# Patient Record
Sex: Male | Born: 1945 | Race: Black or African American | Hispanic: No | Marital: Single | Smoking: Former smoker
Health system: Southern US, Community
[De-identification: ages and names within clinical notes are randomized; demographics above are authoritative.]

## PROBLEM LIST (undated history)

## (undated) DIAGNOSIS — C801 Malignant (primary) neoplasm, unspecified: Secondary | ICD-10-CM

## (undated) DIAGNOSIS — N189 Chronic kidney disease, unspecified: Secondary | ICD-10-CM

## (undated) DIAGNOSIS — IMO0002 Reserved for concepts with insufficient information to code with codable children: Secondary | ICD-10-CM

## (undated) DIAGNOSIS — C9000 Multiple myeloma not having achieved remission: Secondary | ICD-10-CM

## (undated) DIAGNOSIS — Q600 Renal agenesis, unilateral: Secondary | ICD-10-CM

## (undated) DIAGNOSIS — I82409 Acute embolism and thrombosis of unspecified deep veins of unspecified lower extremity: Secondary | ICD-10-CM

## (undated) HISTORY — DX: Reserved for concepts with insufficient information to code with codable children: IMO0002

## (undated) HISTORY — DX: Renal agenesis, unilateral: Q60.0

## (undated) HISTORY — DX: Multiple myeloma not having achieved remission: C90.00

---

## 1999-07-09 ENCOUNTER — Ambulatory Visit (HOSPITAL_COMMUNITY): Admission: RE | Admit: 1999-07-09 | Discharge: 1999-07-09 | Payer: Self-pay | Admitting: General Surgery

## 2000-10-06 ENCOUNTER — Encounter: Payer: Self-pay | Admitting: Specialist

## 2000-10-09 ENCOUNTER — Observation Stay (HOSPITAL_COMMUNITY): Admission: RE | Admit: 2000-10-09 | Discharge: 2000-10-10 | Payer: Self-pay | Admitting: Specialist

## 2005-03-21 ENCOUNTER — Ambulatory Visit: Payer: Self-pay | Admitting: Oncology

## 2005-09-16 ENCOUNTER — Ambulatory Visit: Payer: Self-pay | Admitting: Oncology

## 2006-03-14 ENCOUNTER — Ambulatory Visit: Payer: Self-pay | Admitting: Oncology

## 2006-08-19 ENCOUNTER — Emergency Department (HOSPITAL_COMMUNITY): Admission: EM | Admit: 2006-08-19 | Discharge: 2006-08-19 | Payer: Self-pay | Admitting: Emergency Medicine

## 2006-11-16 ENCOUNTER — Ambulatory Visit: Payer: Self-pay | Admitting: Oncology

## 2009-12-20 ENCOUNTER — Emergency Department (HOSPITAL_COMMUNITY)
Admission: EM | Admit: 2009-12-20 | Discharge: 2009-12-20 | Payer: Self-pay | Source: Home / Self Care | Admitting: Emergency Medicine

## 2010-10-11 ENCOUNTER — Encounter (HOSPITAL_BASED_OUTPATIENT_CLINIC_OR_DEPARTMENT_OTHER): Payer: Self-pay | Admitting: Oncology

## 2010-10-11 ENCOUNTER — Other Ambulatory Visit: Payer: Self-pay | Admitting: Oncology

## 2010-10-11 DIAGNOSIS — C9000 Multiple myeloma not having achieved remission: Secondary | ICD-10-CM

## 2010-10-11 LAB — CBC WITH DIFFERENTIAL/PLATELET
BASO%: 0.2 % (ref 0.0–2.0)
Basophils Absolute: 0 10*3/uL (ref 0.0–0.1)
EOS%: 2 % (ref 0.0–7.0)
Eosinophils Absolute: 0.1 10*3/uL (ref 0.0–0.5)
HCT: 33.6 % — ABNORMAL LOW (ref 38.4–49.9)
HGB: 11.7 g/dL — ABNORMAL LOW (ref 13.0–17.1)
LYMPH%: 18.1 % (ref 14.0–49.0)
MCH: 31.8 pg (ref 27.2–33.4)
MCHC: 34.7 g/dL (ref 32.0–36.0)
MCV: 91.6 fL (ref 79.3–98.0)
MONO#: 0.3 10*3/uL (ref 0.1–0.9)
MONO%: 6.7 % (ref 0.0–14.0)
NEUT#: 2.9 10*3/uL (ref 1.5–6.5)
NEUT%: 73 % (ref 39.0–75.0)
Platelets: 144 10*3/uL (ref 140–400)
RBC: 3.67 10*6/uL — ABNORMAL LOW (ref 4.20–5.82)
RDW: 12.7 % (ref 11.0–14.6)
WBC: 3.9 10*3/uL — ABNORMAL LOW (ref 4.0–10.3)
lymph#: 0.7 10*3/uL — ABNORMAL LOW (ref 0.9–3.3)

## 2010-10-13 LAB — PROTEIN ELECTROPHORESIS, SERUM
Alpha-2-Globulin: 9.9 % (ref 7.1–11.8)
Beta 2: 6 % (ref 3.2–6.5)
Beta Globulin: 5.8 % (ref 4.7–7.2)
Gamma Globulin: 15.6 % (ref 11.1–18.8)
Total Protein, Serum Electrophoresis: 6.5 g/dL (ref 6.0–8.3)

## 2010-10-13 LAB — COMPREHENSIVE METABOLIC PANEL
ALT: 14 U/L (ref 0–53)
AST: 15 U/L (ref 0–37)
Albumin: 4.1 g/dL (ref 3.5–5.2)
Alkaline Phosphatase: 60 U/L (ref 39–117)
Potassium: 3.8 mEq/L (ref 3.5–5.3)
Sodium: 141 mEq/L (ref 135–145)
Total Bilirubin: 0.4 mg/dL (ref 0.3–1.2)
Total Protein: 6.5 g/dL (ref 6.0–8.3)

## 2010-10-13 LAB — KAPPA/LAMBDA LIGHT CHAINS: Lambda Free Lght Chn: 1.12 mg/dL (ref 0.57–2.63)

## 2011-01-21 NOTE — Op Note (Signed)
[facility]  Patient:    Mark Baird, Mark Baird                        MRN: [id number] Proc. Date: 10/09/00 Adm. Date:  [id number] Disc. Date: [id number] Attending:  Montez Morita, Philips Tobias                           Operative Report  PREOPERATIVE DIAGNOSIS:  Ruptured quadriceps tendon, left knee.  POSTOPERATIVE DIAGNOSIS:  Ruptured quadriceps tendon, left knee.  OPERATION PERFORMED:  Repair with  ____________  SURGEON:  Philips J. Montez Morita, M.D.  ANESTHESIA:  General.  DESCRIPTION OF PROCEDURE:  After suitable general anesthesia, the knee was prepped and draped routinely and an upper thigh tourniquet inflated to 350 mmHg.  Then ____________ incision exposing the patella and the tear as it was almost three weeks old, rongeur was used to remove a good bit of the thickened blood and clot and mess and then a knife used to trim off the ____________ smooth off the tear.  A knife was then used to create a flap triangle based distally and extending proximally which was then turned back on itself to be used as a reinforcing flap.  A #5 Ethibond was then passed through the tendon to be used as a tension removing suture and a threaded K-wire was passed transversely through the patella to tie this over.  #1 Ethibond sutures were then passed throughout the tendon for repair and were then tied.  Then in the process of tying the retention suture, the #5 Ethibond shredded against the K-wire and was removed and replaced with a 22 gauge wire suture to be used as a retention suture. The ends of the threaded K-wire were cut off but they would be reachable later to be removed.  Additional sutures of #1 PDS were used to smooth things out.  The triangular piece was turned over and sutured primarily with PDS.  The fascia was closed back on top.  A tourniquet let down some time during the procedure and was let down.  2-0 PDS in the subcu.  Running Monocryl 4-0 in the skin.  Nice compression  dressing and a knee immobilizer goes to recovery in good condition. DD:  10/09/00 TD:  10/11/00 Job: [id number]

## 2014-01-18 ENCOUNTER — Emergency Department (HOSPITAL_COMMUNITY)
Admission: EM | Admit: 2014-01-18 | Discharge: 2014-01-18 | Disposition: A | Discharge status: Home / Self Care | Payer: No Typology Code available for payment source | Attending: Emergency Medicine | Admitting: Emergency Medicine

## 2014-01-18 ENCOUNTER — Encounter (HOSPITAL_COMMUNITY): Payer: Self-pay | Admitting: Emergency Medicine

## 2014-01-18 DIAGNOSIS — Z79899 Other long term (current) drug therapy: Secondary | ICD-10-CM | POA: Insufficient documentation

## 2014-01-18 DIAGNOSIS — Y9389 Activity, other specified: Secondary | ICD-10-CM | POA: Insufficient documentation

## 2014-01-18 DIAGNOSIS — IMO0002 Reserved for concepts with insufficient information to code with codable children: Secondary | ICD-10-CM | POA: Insufficient documentation

## 2014-01-18 DIAGNOSIS — Z880 Allergy status to penicillin: Secondary | ICD-10-CM | POA: Insufficient documentation

## 2014-01-18 DIAGNOSIS — Z859 Personal history of malignant neoplasm, unspecified: Secondary | ICD-10-CM | POA: Insufficient documentation

## 2014-01-18 DIAGNOSIS — Y92410 Unspecified street and highway as the place of occurrence of the external cause: Secondary | ICD-10-CM | POA: Insufficient documentation

## 2014-01-18 HISTORY — DX: Malignant (primary) neoplasm, unspecified: C80.1

## 2014-01-18 MED ORDER — HYDROCODONE-ACETAMINOPHEN 5-325 MG PO TABS: 1.0000 | ORAL_TABLET | Freq: Four times a day (QID) | ORAL | Status: DC | PRN

## 2014-01-18 NOTE — ED Provider Notes (Signed)
Medical screening examination/treatment/procedure(s) were performed by non-physician practitioner and as supervising physician I was immediately available for consultation/collaboration.   EKG Interpretation None       Kalman Drape, MD 01/18/14 [phone]
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
## 2015-09-22 ENCOUNTER — Other Ambulatory Visit: Payer: Self-pay | Admitting: Family Medicine

## 2015-09-22 DIAGNOSIS — Z125 Encounter for screening for malignant neoplasm of prostate: Secondary | ICD-10-CM | POA: Diagnosis not present

## 2015-09-22 DIAGNOSIS — E785 Hyperlipidemia, unspecified: Secondary | ICD-10-CM | POA: Diagnosis not present

## 2015-09-22 DIAGNOSIS — R1310 Dysphagia, unspecified: Secondary | ICD-10-CM | POA: Diagnosis not present

## 2015-09-22 DIAGNOSIS — Z6831 Body mass index (BMI) 31.0-31.9, adult: Secondary | ICD-10-CM | POA: Diagnosis not present

## 2015-09-23 ENCOUNTER — Ambulatory Visit
Admission: RE | Admit: 2015-09-23 | Discharge: 2015-09-23 | Disposition: A | Discharge status: Home / Self Care | Payer: PPO | Source: Ambulatory Visit | Attending: Family Medicine | Admitting: Family Medicine

## 2015-09-23 DIAGNOSIS — R1310 Dysphagia, unspecified: Secondary | ICD-10-CM | POA: Diagnosis not present

## 2015-09-23 DIAGNOSIS — K449 Diaphragmatic hernia without obstruction or gangrene: Secondary | ICD-10-CM | POA: Diagnosis not present

## 2015-11-23 DIAGNOSIS — R1310 Dysphagia, unspecified: Secondary | ICD-10-CM | POA: Diagnosis not present

## 2015-11-23 DIAGNOSIS — Z6830 Body mass index (BMI) 30.0-30.9, adult: Secondary | ICD-10-CM | POA: Diagnosis not present

## 2015-12-25 DIAGNOSIS — Z1211 Encounter for screening for malignant neoplasm of colon: Secondary | ICD-10-CM | POA: Diagnosis not present

## 2015-12-25 DIAGNOSIS — R1310 Dysphagia, unspecified: Secondary | ICD-10-CM | POA: Diagnosis not present

## 2016-01-14 ENCOUNTER — Other Ambulatory Visit: Payer: Self-pay | Admitting: Gastroenterology

## 2016-01-14 DIAGNOSIS — K222 Esophageal obstruction: Secondary | ICD-10-CM | POA: Diagnosis not present

## 2016-01-14 DIAGNOSIS — D126 Benign neoplasm of colon, unspecified: Secondary | ICD-10-CM | POA: Diagnosis not present

## 2016-01-14 DIAGNOSIS — K449 Diaphragmatic hernia without obstruction or gangrene: Secondary | ICD-10-CM | POA: Diagnosis not present

## 2016-01-14 DIAGNOSIS — K621 Rectal polyp: Secondary | ICD-10-CM | POA: Diagnosis not present

## 2016-01-14 DIAGNOSIS — K640 First degree hemorrhoids: Secondary | ICD-10-CM | POA: Diagnosis not present

## 2016-01-14 DIAGNOSIS — R1310 Dysphagia, unspecified: Secondary | ICD-10-CM | POA: Diagnosis not present

## 2016-01-14 DIAGNOSIS — Z1211 Encounter for screening for malignant neoplasm of colon: Secondary | ICD-10-CM | POA: Diagnosis not present

## 2016-01-14 DIAGNOSIS — D125 Benign neoplasm of sigmoid colon: Secondary | ICD-10-CM | POA: Diagnosis not present

## 2016-01-14 DIAGNOSIS — R933 Abnormal findings on diagnostic imaging of other parts of digestive tract: Secondary | ICD-10-CM | POA: Diagnosis not present

## 2016-01-21 DIAGNOSIS — D649 Anemia, unspecified: Secondary | ICD-10-CM | POA: Diagnosis not present

## 2016-01-21 DIAGNOSIS — R634 Abnormal weight loss: Secondary | ICD-10-CM | POA: Diagnosis not present

## 2016-01-21 DIAGNOSIS — R1310 Dysphagia, unspecified: Secondary | ICD-10-CM | POA: Diagnosis not present

## 2016-01-21 DIAGNOSIS — N189 Chronic kidney disease, unspecified: Secondary | ICD-10-CM | POA: Diagnosis not present

## 2017-03-17 DIAGNOSIS — I5040 Unspecified combined systolic (congestive) and diastolic (congestive) heart failure: Secondary | ICD-10-CM | POA: Diagnosis not present

## 2017-03-17 DIAGNOSIS — N189 Chronic kidney disease, unspecified: Secondary | ICD-10-CM | POA: Diagnosis not present

## 2017-03-17 DIAGNOSIS — R609 Edema, unspecified: Secondary | ICD-10-CM | POA: Diagnosis not present

## 2017-03-17 DIAGNOSIS — D649 Anemia, unspecified: Secondary | ICD-10-CM | POA: Diagnosis not present

## 2017-03-21 DIAGNOSIS — I5040 Unspecified combined systolic (congestive) and diastolic (congestive) heart failure: Secondary | ICD-10-CM | POA: Diagnosis not present

## 2017-04-04 DIAGNOSIS — R609 Edema, unspecified: Secondary | ICD-10-CM | POA: Diagnosis not present

## 2017-04-04 DIAGNOSIS — I5040 Unspecified combined systolic (congestive) and diastolic (congestive) heart failure: Secondary | ICD-10-CM | POA: Diagnosis not present

## 2017-04-07 ENCOUNTER — Other Ambulatory Visit: Payer: Self-pay | Admitting: Family Medicine

## 2017-04-07 DIAGNOSIS — I5041 Acute combined systolic (congestive) and diastolic (congestive) heart failure: Secondary | ICD-10-CM

## 2017-04-13 ENCOUNTER — Ambulatory Visit (HOSPITAL_COMMUNITY): Payer: PPO | Attending: Cardiology

## 2017-04-13 ENCOUNTER — Other Ambulatory Visit: Payer: Self-pay

## 2017-04-13 DIAGNOSIS — I5041 Acute combined systolic (congestive) and diastolic (congestive) heart failure: Secondary | ICD-10-CM

## 2017-04-13 DIAGNOSIS — I081 Rheumatic disorders of both mitral and tricuspid valves: Secondary | ICD-10-CM | POA: Diagnosis not present

## 2017-04-13 DIAGNOSIS — I272 Pulmonary hypertension, unspecified: Secondary | ICD-10-CM | POA: Diagnosis not present

## 2017-04-13 DIAGNOSIS — I509 Heart failure, unspecified: Secondary | ICD-10-CM | POA: Insufficient documentation

## 2017-04-19 DIAGNOSIS — N189 Chronic kidney disease, unspecified: Secondary | ICD-10-CM | POA: Diagnosis not present

## 2017-04-19 DIAGNOSIS — K5909 Other constipation: Secondary | ICD-10-CM | POA: Diagnosis not present

## 2017-04-19 DIAGNOSIS — R609 Edema, unspecified: Secondary | ICD-10-CM | POA: Diagnosis not present

## 2017-04-19 DIAGNOSIS — I5040 Unspecified combined systolic (congestive) and diastolic (congestive) heart failure: Secondary | ICD-10-CM | POA: Diagnosis not present

## 2017-04-26 DIAGNOSIS — N189 Chronic kidney disease, unspecified: Secondary | ICD-10-CM | POA: Diagnosis not present

## 2017-04-26 DIAGNOSIS — R609 Edema, unspecified: Secondary | ICD-10-CM | POA: Diagnosis not present

## 2017-04-26 DIAGNOSIS — I272 Pulmonary hypertension, unspecified: Secondary | ICD-10-CM | POA: Diagnosis not present

## 2017-04-26 DIAGNOSIS — I5040 Unspecified combined systolic (congestive) and diastolic (congestive) heart failure: Secondary | ICD-10-CM | POA: Diagnosis not present

## 2017-05-10 DIAGNOSIS — I5040 Unspecified combined systolic (congestive) and diastolic (congestive) heart failure: Secondary | ICD-10-CM | POA: Diagnosis not present

## 2017-05-10 DIAGNOSIS — N189 Chronic kidney disease, unspecified: Secondary | ICD-10-CM | POA: Diagnosis not present

## 2017-05-10 DIAGNOSIS — Z6827 Body mass index (BMI) 27.0-27.9, adult: Secondary | ICD-10-CM | POA: Diagnosis not present

## 2017-05-10 DIAGNOSIS — I272 Pulmonary hypertension, unspecified: Secondary | ICD-10-CM | POA: Diagnosis not present

## 2017-05-31 DIAGNOSIS — R609 Edema, unspecified: Secondary | ICD-10-CM | POA: Diagnosis not present

## 2017-05-31 DIAGNOSIS — N189 Chronic kidney disease, unspecified: Secondary | ICD-10-CM | POA: Diagnosis not present

## 2017-05-31 DIAGNOSIS — I5040 Unspecified combined systolic (congestive) and diastolic (congestive) heart failure: Secondary | ICD-10-CM | POA: Diagnosis not present

## 2017-06-16 DIAGNOSIS — I5021 Acute systolic (congestive) heart failure: Secondary | ICD-10-CM | POA: Diagnosis not present

## 2017-06-16 DIAGNOSIS — Z0189 Encounter for other specified special examinations: Secondary | ICD-10-CM | POA: Diagnosis not present

## 2017-06-16 DIAGNOSIS — I420 Dilated cardiomyopathy: Secondary | ICD-10-CM | POA: Diagnosis not present

## 2017-06-21 DIAGNOSIS — I5040 Unspecified combined systolic (congestive) and diastolic (congestive) heart failure: Secondary | ICD-10-CM | POA: Diagnosis not present

## 2017-06-21 DIAGNOSIS — N189 Chronic kidney disease, unspecified: Secondary | ICD-10-CM | POA: Diagnosis not present

## 2017-06-21 DIAGNOSIS — I272 Pulmonary hypertension, unspecified: Secondary | ICD-10-CM | POA: Diagnosis not present

## 2017-06-23 DIAGNOSIS — I420 Dilated cardiomyopathy: Secondary | ICD-10-CM | POA: Diagnosis not present

## 2017-06-25 DIAGNOSIS — I5043 Acute on chronic combined systolic (congestive) and diastolic (congestive) heart failure: Secondary | ICD-10-CM | POA: Diagnosis present

## 2017-06-25 DIAGNOSIS — R0602 Shortness of breath: Secondary | ICD-10-CM | POA: Diagnosis present

## 2017-06-25 NOTE — H&P (Signed)
OFFICE VISIT NOTES COPIED TO EPIC FOR DOCUMENTATION  . History of Present Illness Laverda Page MD; 06/17/2017 9:51 AM) Patient words: NP Eval- CHF, HTN.  The patient is a 71 year old male who presents with cardiomyopathy. Mykai Wendorf is an African-American gentleman with no prior cardiovascular history, denies any hypertension, hyperlipidemia or diabetes, history of mild (~10 pack year history) tobacco use disorder, who had worsening shortness of breath, leg edema and marked abdominal discomfort and distention and was evaluated by [name] and underwent outpatient echocardiogram on 04/13/2017 revealing severe LV systolic dysfunction.   She aggressively treated him with diuretics, started him on Entresto and also beta blocker therapy, since then he lost significant amount of weight and dyspnea has improved remarkably. He is now close to his baseline, states that except for mild dyspnea especially when he climbs up flights of stairs no other symptoms. Denies chest pain. He has not had any further PND or orthopnea or leg edema. Appetite has been good.   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 History to feeling. Mild MR, moderate to severe left atrial enlargement at 56 mm, moderate RV dilatation with normal function, moderate pulmonary angina, PA pressure 51 mmHg. Moderate-sized left pleural effusion.  Allergies Anderson Malta Sergeant; 23-Jun-2017 11:17 AM) Penicillins  Edema.  Family History Anderson Malta Sergeant; June 23, 2017 11:18 AM) Mother  Deceased. at age 56, no heart issues Father  Deceased. at age 90, Emphysema, no heart issues  Social History  Anderson Malta Sergeant; 23-Jun-2017 11:20 AM) Current tobacco use  Former smoker. quit in 1989, smoked for about 20 years 1 pack every 3 days Non Drinker/No Alcohol Use  Marital status  Single. Number of Children  0. Living Situation  Lives alone.  Past Surgical History Anderson Malta Sergeant; 23-Jun-2017 11:48 AM) None [06/23/17]:  Medication History Anderson Malta Sergeant; 2017/06/23 11:34 AM) Delene Loll (97-103MG Tablet, 1 Oral two times daily) Active. Torsemide (20MG Tablet, 1 Oral daily) Active. Metoprolol Tartrate (25MG Tablet, 1 Oral daily) Active. Spironolactone (25MG Tablet, 1 Oral two times daily) Active. Cinnamon (1 Oral occasional) Specific strength unknown - Active. Medications Reconciled (verbally and from medical records)  Diagnostic Studies History Laverda Page, MD; 06/17/2017 9:50 AM) Colonoscopy [11/2015]: Normal. 3 months removed Endoscopy [2018]: Echocardiogram  Hospital Echocardiogram 04/13/2017: Mildly dilated LV, LV systolic function severely depressed at 25-30% with diffuse hypokinesis with distinct wall motion abnormality including inferolateral, inferior and inferoseptal akinesis. History to feeling. Mild MR, moderate to severe left atrial enlargement at 56 mm, moderate RV dilatation with normal function, moderate pulmonary angina, PA pressure 51 mmHg. Moderate-sized left pleural effusion.    Review of Systems Laverda Page MD; 2017/06/23 12:19 PM) General Not Present- Appetite Loss and Weight Gain. Respiratory Present- Decreased Exercise Tolerance and Difficulty Breathing on Exertion. Not Present- Chronic Cough and Wakes up from Sleep Wheezing or Short of Breath. Cardiovascular Present- Edema (foot). Not Present- Difficulty Breathing Lying Down and Difficulty Breathing On Exertion. Gastrointestinal Not Present- Black, Tarry Stool and Difficulty Swallowing. Musculoskeletal Not Present- Decreased Range of Motion and Muscle Atrophy. Neurological Not  Present- Attention Deficit. Psychiatric Not Present- Personality Changes and Suicidal Ideation. Endocrine Not Present- Cold Intolerance and Heat Intolerance. Hematology Not Present- Abnormal Bleeding. All other systems negative  Vitals Anderson Malta Sergeant; 2017-06-23 11:34 AM) 06/23/2017 11:13  AM Weight: 182.25 lb Height: 71in Body Surface Area: 2.03 m Body Mass Index: 25.42 kg/m  Pulse: 66 (Regular)  P.OX: 98% (Room air) BP: 98/55 (Sitting, Left Arm, Standard)       Physical Exam Laverda Page MD; 06/16/2017 12:20 PM) General Mental Status-Alert. General Appearance-Cooperative and Appears stated age. Build & Nutrition-Well built and Well nourished.  Head and Neck Thyroid Gland Characteristics - normal size and consistency and no palpable nodules.  Chest and Lung Exam Chest and lung exam reveals -quiet, even and easy respiratory effort with no use of accessory muscles, non-tender and on auscultation, normal breath sounds, no adventitious sounds.  Cardiovascular Cardiovascular examination reveals -normal heart sounds, regular rate and rhythm with no murmurs, carotid auscultation reveals no bruits, abdominal aorta auscultation reveals no bruits and no prominent pulsation, femoral artery auscultation bilaterally reveals normal pulses, no bruits, no thrills and normal pedal pulses bilaterally.  Abdomen Palpation/Percussion Palpation and Percussion of the abdomen reveal - Non Tender and No hepatosplenomegaly.  Peripheral Vascular Lower Extremity Palpation - Edema - Bilateral - 2+ Pitting edema(ankle edema).  Neurologic Neurologic evaluation reveals -alert and oriented x 3 with no impairment of recent or remote memory. Motor-Grossly intact without any focal deficits.  Musculoskeletal Global Assessment Left Lower Extremity - no deformities, masses or tenderness, no known fractures. Right Lower Extremity - no deformities, masses or tenderness, no  known fractures.    Assessment & Plan Laverda Page MD; 06/17/2017 4:27 AM) Acute systolic heart failure (C62.37) Story: Hospital Echocardiogram 04/13/2017: Mildly dilated LV, LV systolic function severely depressed at 25-30% with diffuse hypokinesis with distinct wall motion abnormality including inferolateral, inferior and inferoseptal akinesis. History to feeling. Mild MR, moderate to severe left atrial enlargement at 56 mm, moderate RV dilatation with normal function, moderate pulmonary angina, PA pressure 51 mmHg. Moderate-sized left pleural effusion. Dilated cardiomyopathy (I42.0) Story: EKG 06/16/2017: Normal sinus rhythm at rate of 65 bpm, normal axis, IVCD, incomplete LBBB. Nonspecific T abnormality. Current Plans Started Pravastatin Sodium 20MG, 1 (one) Tablet every evening after dinner, #90, 90 days starting 06/16/2017, Ref. x1. Complete electrocardiogram (93000) Future Plans 62/83/1517: METABOLIC PANEL, BASIC (61607) - one time 06/21/2017: CBC & PLATELETS (AUTO) (37106) - one time 06/21/2017: PT (PROTHROMBIN TIME) (26948) - one time Laboratory examination (Z01.89) Story: 06/23/2017: Creatinine 1.74, EGFR 39/45, sodium 145, potassium 4.5, BMP normal.  RBC 3.5, hemoglobin 10.8, hematocrit 32.9, platelets 129, CBC otherwise normal.  INR 1.0, prothrombin time 10.8.  Labs 06/01/2017: Serum glucose 110 mg, BUN 23, creatinine 1.47, eGFR 55 mL, BNP 4200  Labs 05/10/2017: Total cholesterol 86, triglycerides 71, HDL 31, LDL 40. Serum glucose 110 mg, BUN 27, creatinine 1.74, eGFR 45 mL, potassium 4.3. CMP otherwise normal. BNP 1376. HB 10.7/HCT 31.8, platelets 131.  Note:-  Recommendations:  Patient has been very well treated and appropriately treated by [name] with regard to management of new onset acute on systolic heart failure, now symptoms essentially resolved but for mild residual dyspnea. He also has mild ankle edema. He is on best medical therapy.  Although his lipids  are within normal limits, patient has wall motion abnormality on the echocardiogram and hence underlying coronary artery disease cannot excluded, hence started him on small dose of pravastatin 20 mg in the evening. He needs right and left heart catheterization both to evaluate his dyspnea and also pulmonary hypertension along with coronary status. Schedule for cardiac catheterization, and possible angioplasty. We discussed regarding risks, benefits, alternatives to this including stress testing, CTA  and continued medical therapy. Patient wants to proceed. Understands <1-2% risk of death, stroke, MI, urgent CABG, bleeding, infection, renal failure but not limited to these.  CC: Lucianne Lei, MD.    Signed by Laverda Page, MD (06/17/2017 9:53 AM)

## 2017-06-30 ENCOUNTER — Encounter (HOSPITAL_COMMUNITY): Admission: RE | Payer: Self-pay | Source: Ambulatory Visit

## 2017-06-30 ENCOUNTER — Ambulatory Visit (HOSPITAL_COMMUNITY): Admission: RE | Admit: 2017-06-30 | Payer: PPO | Source: Ambulatory Visit | Admitting: Cardiology

## 2017-06-30 SURGERY — RIGHT/LEFT HEART CATH AND CORONARY ANGIOGRAPHY
Anesthesia: LOCAL

## 2017-07-10 DIAGNOSIS — I5021 Acute systolic (congestive) heart failure: Secondary | ICD-10-CM | POA: Diagnosis not present

## 2017-07-10 DIAGNOSIS — N183 Chronic kidney disease, stage 3 (moderate): Secondary | ICD-10-CM | POA: Diagnosis not present

## 2017-07-10 DIAGNOSIS — I420 Dilated cardiomyopathy: Secondary | ICD-10-CM | POA: Diagnosis not present

## 2017-07-10 DIAGNOSIS — Z0189 Encounter for other specified special examinations: Secondary | ICD-10-CM | POA: Diagnosis not present

## 2017-07-24 DIAGNOSIS — N183 Chronic kidney disease, stage 3 (moderate): Secondary | ICD-10-CM | POA: Diagnosis not present

## 2017-07-24 DIAGNOSIS — I420 Dilated cardiomyopathy: Secondary | ICD-10-CM | POA: Diagnosis not present

## 2017-07-24 DIAGNOSIS — I5021 Acute systolic (congestive) heart failure: Secondary | ICD-10-CM | POA: Diagnosis not present

## 2017-07-24 DIAGNOSIS — Z0189 Encounter for other specified special examinations: Secondary | ICD-10-CM | POA: Diagnosis not present

## 2017-08-10 DIAGNOSIS — I5021 Acute systolic (congestive) heart failure: Secondary | ICD-10-CM | POA: Diagnosis not present

## 2017-08-22 DIAGNOSIS — I5021 Acute systolic (congestive) heart failure: Secondary | ICD-10-CM | POA: Diagnosis not present

## 2017-08-25 DIAGNOSIS — Z0189 Encounter for other specified special examinations: Secondary | ICD-10-CM | POA: Diagnosis not present

## 2017-08-25 DIAGNOSIS — I420 Dilated cardiomyopathy: Secondary | ICD-10-CM | POA: Diagnosis not present

## 2017-08-25 DIAGNOSIS — N183 Chronic kidney disease, stage 3 (moderate): Secondary | ICD-10-CM | POA: Diagnosis not present

## 2017-09-07 DIAGNOSIS — I420 Dilated cardiomyopathy: Secondary | ICD-10-CM | POA: Diagnosis not present

## 2017-09-10 DIAGNOSIS — I5022 Chronic systolic (congestive) heart failure: Secondary | ICD-10-CM

## 2017-09-10 NOTE — H&P (Addendum)
Labs 09/07/2017: H/H 10.5/31.1. MCV 94. Platelets 128 Glucose 145. BUN/Cr 33.197. eGFR 33. Na146, K 3.8 INR 1.0  I have called the patient to ask him to hold entresto and losartan on 09/10/2017, and plan to hydrate on 1/7 morning starting 5:30 AM, but unable to reach the patient. Will discuss with the patient on 1/7 am.  Nigel Mormon, MD [facility] Pager: [phone] Office: [phone] If no answer Cell [phone]

## 2017-09-10 NOTE — H&P (Signed)
Mark Baird Sep 12, 2017 11:45 AM Location: [facility] Patient #: [id number] DOB: 07/13/1946 Single / Language: Mark Baird / Race: Black or African American Male   History of Present Illness Mark Gaskins Patwardhan MD; 09/12/17 12:51 PM) Patient words: Last OV 07/24/2017; 2 week f/u pt needs confirmation on what meds to take.  The patient is a 72 year old male who presents for a follow-up for Cardiomyopathy. 72 year old [place] American male with new diagnosis of biventricular failure since August 2018. He started on guideline directed medical therapy. He was supposed to undergo left and right heart catheterization in October. He is here for follow-up today.  He is somewhat confused about his medications since which I was able to explain to him in detail. It appears that he was taking both carvedilol and metoprolol tartrate. His physical activities Limited. He lives by himself. He denies any significant chest pain or shortness of breath. While he denies leg edema is clearly shows elevated BNP.w started to feel better and dyspnea is also better.     Problem List/Past Medical Mark Baird; 09/12/17 12:06 PM) Dilated cardiomyopathy (I42.0)  EKG 06/16/2017: Normal sinus rhythm at rate of 65 bpm, normal axis, IVCD, incomplete LBBB. Nonspecific T abnormality. Laboratory examination (Z01.89)  08/15/2017: Glucose 134, creatinine 1.58, EGFR 43/50, potassium 4.3, BMP otherwise normal. BNP 1579.8 Labs 07/10/2017: Serum glucose 90 mg, BUN 17, creatinine 1.42, EGFR 49/57 mL, potassium 4.3. BNP 1244. 06/23/2017: Creatinine 1.74, EGFR 39/45, sodium 145, potassium 4.5, BMP normal. RBC 3.5, hemoglobin 10.8, hematocrit 32.9, platelets 129, CBC otherwise normal. INR 1.0, prothrombin time 10.8. Labs 06/01/2017: Serum glucose 110 mg, BUN 23, creatinine 1.47, eGFR 55 mL, BNP 4200 Labs 05/10/2017: Total cholesterol 86, triglycerides 71, HDL 31, LDL 40. Serum glucose 110 mg, BUN 27, creatinine 1.74,  eGFR 45 mL, potassium 4.3. CMP otherwise normal. BNP 1376. HB 10.7/HCT 31.8, platelets 131. CKD (chronic kidney disease) stage 3, GFR 30-59 ml/min (J24.2)  Acute systolic heart failure (A83.41) [04/13/2017]: Hospital Echocardiogram 04/13/2017: Mildly dilated LV, LV systolic function severely depressed at 25-30% with diffuse hypokinesis with distinct wall motion abnormality including inferolateral, inferior and inferoseptal akinesis. History to feeling. Mild MR, moderate to severe left atrial enlargement at 56 mm, moderate RV dilatation with normal function, moderate pulmonary angina, PA pressure 51 mmHg. Moderate-sized left pleural effusion.  Allergies Mark Baird; 09-12-2017 12:06 PM) Penicillins  Edema.  Family History Mark Baird; 09/12/2017 12:06 PM) Mother  Deceased. at age 15, no heart issues Father  Deceased. at age 41, Emphysema, no heart issues  Social History Mark Baird; Sep 12, 2017 12:06 PM) Current tobacco use  Former smoker. quit in 1989, smoked for about 20 years 1 pack every 3 days Non Drinker/No Alcohol Use  Marital status  Single. Number of Children  0. Living Situation  Lives alone.  Past Surgical History Mark Baird; Sep 12, 2017 12:06 PM) None [06/16/2017]:  Medication History Mark Baird; 09/12/2017 12:17 PM) Pravastatin Sodium (20MG Tablet, 1 (one) Tablet Oral every evening after dinner, Taken starting 07/24/2017) Active. Coreg (6.25MG Tablet, 1 (one) Tablet Oral two times daily, Taken starting 07/24/2017) Active. Furosemide (20MG Tablet, 1 (one) Tablet Tablet Oral every morning, Taken starting 07/10/2017) Active. (Discontinue Demadex) Cinnamon (1 Oral occasional) Specific strength unknown - Active. Aspirin (81MG Tablet DR, 1 Oral daily) Active. Entresto (97-103MG Tablet, 1 Oral two times daily) Active. Spironolactone (25MG Tablet, 1 Oral two times daily) Active. Metoprolol Tartrate (25MG Tablet, 1 Oral daily)  Active. Medications Reconciled (meds present)  Diagnostic Studies History Mark Baird; 09-12-2017 12:06  PM) Colonoscopy [11/2015]: Normal. 3 months removed Endoscopy [2018]: Echocardiogram  Hospital Echocardiogram 04/13/2017: Mildly dilated LV, LV systolic function severely depressed at 25-30% with diffuse hypokinesis with distinct wall motion abnormality including inferolateral, inferior and inferoseptal akinesis. History to feeling. Mild MR, moderate to severe left atrial enlargement at 56 mm, moderate RV dilatation with normal function, moderate pulmonary angina, PA pressure 51 mmHg. Moderate-sized left pleural effusion.    Review of Systems Mark Leep, MD; 08/25/2017 12:55 PM) General Not Present- Appetite Loss and Weight Gain. Respiratory Present- Decreased Exercise Tolerance and Difficulty Breathing on Exertion. Not Present- Chronic Cough and Wakes up from Sleep Wheezing or Short of Breath. Cardiovascular Present- Edema (foot). Not Present- Difficulty Breathing Lying Down and Difficulty Breathing On Exertion. Gastrointestinal Not Present- Black, Tarry Stool and Difficulty Swallowing. Musculoskeletal Not Present- Decreased Range of Motion and Muscle Atrophy. Neurological Not Present- Attention Deficit. Psychiatric Not Present- Personality Changes and Suicidal Ideation. Endocrine Not Present- Cold Intolerance and Heat Intolerance. Hematology Not Present- Abnormal Bleeding. All other systems negative  Vitals Mark Baird; 08/25/2017 12:10 PM) 08/25/2017 12:08 PM Weight: 174.25 lb Height: 71in Body Surface Area: 1.99 m Body Mass Index: 24.3 kg/m  Pulse: 67 (Regular)  P.OX: 99% (Room air) BP: 142/80 (Sitting, Left Arm, Standard)       Physical Exam Mark Gaskins Patwardhan MD; 08/25/2017 12:50 PM) General Mental Status-Alert. General Appearance-Cooperative and Appears stated age. Build & Nutrition-Well built and Well nourished.  Head and  Neck Thyroid Gland Characteristics - normal size and consistency and no palpable nodules.  Chest and Lung Exam Chest and lung exam reveals -quiet, even and easy respiratory effort with no use of accessory muscles, non-tender and on auscultation, normal breath sounds, no adventitious sounds.  Cardiovascular Cardiovascular examination reveals -normal heart sounds, regular rate and rhythm with no murmurs, carotid auscultation reveals no bruits, abdominal aorta auscultation reveals no bruits and no prominent pulsation, femoral artery auscultation bilaterally reveals normal pulses, no bruits, no thrills and normal pedal pulses bilaterally.  Abdomen Palpation/Percussion Normal exam - Non Tender and No hepatosplenomegaly.  Peripheral Vascular Lower Extremity Palpation - Edema - Bilateral - 2+ Pitting edema. Carotid arteries - Bilateral-No Carotid bruit.  Neurologic Neurologic evaluation reveals -alert and oriented x 3 with no impairment of recent or remote memory. Motor-Grossly intact without any focal deficits.  Musculoskeletal Global Assessment Left Lower Extremity - no deformities, masses or tenderness, no known fractures. Right Lower Extremity - no deformities, masses or tenderness, no known fractures.    Assessment & Plan Mark Gaskins Patwardhan MD; 08/25/2017 12:54 PM) Dilated cardiomyopathy (I42.0) Story: EKG 06/16/2017: Normal sinus rhythm at rate of 65 bpm, normal axis, IVCD, incomplete LBBB. Nonspecific T abnormality. Current Plans Started Spironolactone 25MG, 2 once daily 30 minutes before meal, 08/25/2017, No Refill. Changed Furosemide 20MG, 2 (two) Tablet every morning, #30, 08/25/2017, Ref. x2. Local Order: Discontinue Demadex Changed Coreg 6.25MG, 2 (two) Tablet two times daily, #60, 30 days starting 08/25/2017, Ref. x2. CKD (chronic kidney disease) stage 3, GFR 30-59 ml/min (N18.3) Laboratory examination (X90.24) Story: 08/15/2017: Glucose 134, creatinine 1.58,  EGFR 43/50, potassium 4.3, BMP otherwise normal. BNP 1579.8  Labs 07/10/2017: Serum glucose 90 mg, BUN 17, creatinine 1.42, EGFR 49/57 mL, potassium 4.3. BNP 1244.  06/23/2017: Creatinine 1.74, EGFR 39/45, sodium 145, potassium 4.5, BMP normal. RBC 3.5, hemoglobin 10.8, hematocrit 32.9, platelets 129, CBC otherwise normal. INR 1.0, prothrombin time 10.8.  Labs 06/01/2017: Serum glucose 110 mg, BUN 23, creatinine 1.47, eGFR 55 mL, BNP 4200  Labs 05/10/2017:  Total cholesterol 86, triglycerides 71, HDL 31, LDL 40. Serum glucose 110 mg, BUN 27, creatinine 1.74, eGFR 45 mL, potassium 4.3. CMP otherwise normal. BNP 1376. HB 10.7/HCT 31.8, platelets 131.  Note:-  Recommendations:  72 year old [place] American male with new diagnosis of biventricular failure since August 2018. He started on guideline directed medical therapy. He was supposed to undergo left and right heart catheterization in October. He is here for follow-up today.  I spent significant time to review all the medications and we are all his confusion. I have asked him to stop taking metoprolol tartrate. Instead, he should increase the dose of carvedilol to 12.5 mg twice daily. He can take 4 pills of the 6.125 mg twice daily, followed by 2 pills of 6.25 mg twice daily. I will give him ascription of 12.5 mg carvedilol of her he finishes these 2 bottles to avoid further confusion. He should take spironolactone 50 mg once daily, by taking 2 pills of 25 mg once daily. Again, I will give him a prescription of 50 mg once daily after he finishes the current auto. Continue Entresto 97-1 03 mg twice daily. I have increased his Lasix to 40 mg daily that is stuporous of 20 mg once daily.  Now that he has been on 3 months of medical therapy, I will repeat an echocardiogram. If EF has improved to normal, continue the medical therapy. If continues to be low, he needs right and left heart catheterization to evaluate for ischemic cardiomyopathy. If  no significant coronary artery disease, and EF remains low, he will need ICD.  CC: Lucianne Lei, MD.  Signed electronically by Mark Leep, MD (08/25/2017 12:55 PM)

## 2017-09-11 ENCOUNTER — Encounter (HOSPITAL_COMMUNITY)
Admission: RE | Disposition: A | Discharge status: Home / Self Care | Payer: Self-pay | Source: Ambulatory Visit | Attending: Cardiology

## 2017-09-11 ENCOUNTER — Ambulatory Visit (HOSPITAL_COMMUNITY)
Admission: RE | Admit: 2017-09-11 | Discharge: 2017-09-11 | Disposition: A | Discharge status: Home / Self Care | Payer: PPO | Source: Ambulatory Visit | Attending: Cardiology | Admitting: Cardiology

## 2017-09-11 DIAGNOSIS — Z79899 Other long term (current) drug therapy: Secondary | ICD-10-CM | POA: Diagnosis not present

## 2017-09-11 DIAGNOSIS — Z87891 Personal history of nicotine dependence: Secondary | ICD-10-CM | POA: Insufficient documentation

## 2017-09-11 DIAGNOSIS — Z7982 Long term (current) use of aspirin: Secondary | ICD-10-CM | POA: Diagnosis not present

## 2017-09-11 DIAGNOSIS — N183 Chronic kidney disease, stage 3 (moderate): Secondary | ICD-10-CM | POA: Insufficient documentation

## 2017-09-11 DIAGNOSIS — I5022 Chronic systolic (congestive) heart failure: Secondary | ICD-10-CM

## 2017-09-11 DIAGNOSIS — I429 Cardiomyopathy, unspecified: Secondary | ICD-10-CM | POA: Insufficient documentation

## 2017-09-11 HISTORY — PX: RIGHT/LEFT HEART CATH AND CORONARY ANGIOGRAPHY: CATH118266

## 2017-09-11 LAB — POCT I-STAT 3, VENOUS BLOOD GAS (G3P V)
Acid-base deficit: 1 mmol/L (ref 0.0–2.0)
Bicarbonate: 24.4 mmol/L (ref 20.0–28.0)
O2 Saturation: 74 %
PCO2 VEN: 42.1 mmHg — AB (ref 44.0–60.0)
PH VEN: 7.372 (ref 7.250–7.430)
TCO2: 26 mmol/L (ref 22–32)
pO2, Ven: 40 mmHg (ref 32.0–45.0)

## 2017-09-11 LAB — POCT I-STAT 3, ART BLOOD GAS (G3+)
Acid-base deficit: 1 mmol/L (ref 0.0–2.0)
Bicarbonate: 24.5 mmol/L (ref 20.0–28.0)
O2 Saturation: 96 %
PCO2 ART: 41.2 mmHg (ref 32.0–48.0)
PH ART: 7.383 (ref 7.350–7.450)
TCO2: 26 mmol/L (ref 22–32)
pO2, Arterial: 80 mmHg — ABNORMAL LOW (ref 83.0–108.0)

## 2017-09-11 SURGERY — RIGHT/LEFT HEART CATH AND CORONARY ANGIOGRAPHY
Anesthesia: LOCAL

## 2017-09-11 MED ORDER — ONDANSETRON HCL 4 MG/2ML IJ SOLN: 4.0000 mg | Freq: Four times a day (QID) | INTRAMUSCULAR | Status: DC | PRN

## 2017-09-11 MED ORDER — VERAPAMIL HCL 2.5 MG/ML IV SOLN
INTRAVENOUS | Status: DC | PRN
  Administered 2017-09-11: 10 mL via INTRA_ARTERIAL

## 2017-09-11 MED ORDER — ACETAMINOPHEN 325 MG PO TABS: 650.0000 mg | ORAL_TABLET | ORAL | Status: DC | PRN

## 2017-09-11 MED ORDER — MIDAZOLAM HCL 2 MG/2ML IJ SOLN
INTRAMUSCULAR | Status: AC
  Filled 2017-09-11: qty 2

## 2017-09-11 MED ORDER — SODIUM CHLORIDE 0.9% FLUSH: 3.0000 mL | Freq: Two times a day (BID) | INTRAVENOUS | Status: DC

## 2017-09-11 MED ORDER — HEPARIN (PORCINE) IN NACL 2-0.9 UNIT/ML-% IJ SOLN
INTRAMUSCULAR | Status: AC
  Filled 2017-09-11: qty 500

## 2017-09-11 MED ORDER — LIDOCAINE HCL (PF) 1 % IJ SOLN
INTRAMUSCULAR | Status: DC | PRN
Start: 2017-09-11 — End: 2017-09-11
  Administered 2017-09-11: 5 mL

## 2017-09-11 MED ORDER — SODIUM CHLORIDE 0.9 % IV SOLN: 250.0000 mL | INTRAVENOUS | Status: DC | PRN

## 2017-09-11 MED ORDER — SODIUM CHLORIDE 0.9 % IV SOLN
INTRAVENOUS | Status: AC | PRN
  Administered 2017-09-11: 250 mL via INTRAVENOUS

## 2017-09-11 MED ORDER — IOPAMIDOL (ISOVUE-370) INJECTION 76%
INTRAVENOUS | Status: AC
  Filled 2017-09-11: qty 100

## 2017-09-11 MED ORDER — SODIUM CHLORIDE 0.9 % IV SOLN
INTRAVENOUS | Status: DC
  Administered 2017-09-11: 07:00:00 via INTRAVENOUS

## 2017-09-11 MED ORDER — ASPIRIN 81 MG PO CHEW
81.0000 mg | CHEWABLE_TABLET | ORAL | Status: AC
  Administered 2017-09-11: 81 mg via ORAL

## 2017-09-11 MED ORDER — SODIUM CHLORIDE 0.9 % IV SOLN: INTRAVENOUS | Status: AC

## 2017-09-11 MED ORDER — VERAPAMIL HCL 2.5 MG/ML IV SOLN
INTRAVENOUS | Status: AC
  Filled 2017-09-11: qty 2

## 2017-09-11 MED ORDER — SODIUM CHLORIDE 0.9% FLUSH: 3.0000 mL | INTRAVENOUS | Status: DC | PRN

## 2017-09-11 MED ORDER — ASPIRIN 81 MG PO CHEW
CHEWABLE_TABLET | ORAL | Status: AC
  Administered 2017-09-11: 81 mg via ORAL
  Filled 2017-09-11: qty 1

## 2017-09-11 MED ORDER — FENTANYL CITRATE (PF) 100 MCG/2ML IJ SOLN
INTRAMUSCULAR | Status: AC
  Filled 2017-09-11: qty 2

## 2017-09-11 MED ORDER — HEPARIN SODIUM (PORCINE) 1000 UNIT/ML IJ SOLN
INTRAMUSCULAR | Status: DC | PRN
  Administered 2017-09-11: 2000 [IU] via INTRAVENOUS
  Administered 2017-09-11: 3000 [IU] via INTRAVENOUS

## 2017-09-11 MED ORDER — HEPARIN (PORCINE) IN NACL 2-0.9 UNIT/ML-% IJ SOLN
INTRAMUSCULAR | Status: AC | PRN
  Administered 2017-09-11: 1000 mL

## 2017-09-11 MED ORDER — CARVEDILOL 6.25 MG PO TABS: 6.2500 mg | ORAL_TABLET | Freq: Two times a day (BID) | ORAL | Status: DC

## 2017-09-11 MED ORDER — HEPARIN SODIUM (PORCINE) 1000 UNIT/ML IJ SOLN
INTRAMUSCULAR | Status: AC
  Filled 2017-09-11: qty 1

## 2017-09-11 MED ORDER — MIDAZOLAM HCL 2 MG/2ML IJ SOLN
INTRAMUSCULAR | Status: DC | PRN
  Administered 2017-09-11: 1 mg via INTRAVENOUS

## 2017-09-11 MED ORDER — FENTANYL CITRATE (PF) 100 MCG/2ML IJ SOLN
INTRAMUSCULAR | Status: DC | PRN
  Administered 2017-09-11: 25 ug via INTRAVENOUS

## 2017-09-11 MED ORDER — IOPAMIDOL (ISOVUE-370) INJECTION 76%
INTRAVENOUS | Status: DC | PRN
  Administered 2017-09-11: 30 mL via INTRA_ARTERIAL

## 2017-09-11 SURGICAL SUPPLY — 13 items
CATH 5FR JL3.5 JR4 ANG PIG MP (CATHETERS) ×1 IMPLANT
CATH BALLN WEDGE 5F 110CM (CATHETERS) ×1 IMPLANT
DEVICE RAD COMP TR BAND LRG (VASCULAR PRODUCTS) ×1 IMPLANT
GLIDESHEATH SLEND SS 6F .021 (SHEATH) ×1 IMPLANT
GUIDEWIRE INQWIRE 1.5J.035X260 (WIRE) IMPLANT
INQWIRE 1.5J .035X260CM (WIRE) ×2
KIT HEART LEFT (KITS) ×2 IMPLANT
PACK CARDIAC CATHETERIZATION (CUSTOM PROCEDURE TRAY) ×2 IMPLANT
SHEATH GLIDE SLENDER 4/5FR (SHEATH) ×2 IMPLANT
TRANSDUCER W/STOPCOCK (MISCELLANEOUS) ×2 IMPLANT
TUBING CIL FLEX 10 FLL-RA (TUBING) ×2 IMPLANT
WIRE ASAHI PROWATER 180CM (WIRE) ×2 IMPLANT
WIRE EMERALD 3MM-J .025X260CM (WIRE) ×1 IMPLANT

## 2017-09-11 NOTE — Interval H&P Note (Signed)
History and Physical Interval Note:  09/11/2017 8:58 AM  Mark Baird  has presented today for surgery, with the diagnosis of Cardiomypathy  The various methods of treatment have been discussed with the patient and family. After consideration of risks, benefits and other options for treatment, the patient has consented to  Procedure(s): RIGHT/LEFT HEART CATH AND CORONARY ANGIOGRAPHY (N/A) as a surgical intervention .  The patient's history has been reviewed, patient examined, no change in status, stable for surgery.  I have reviewed the patient's chart and labs.  Questions were answered to the patient's satisfaction.    2012 Appropriate Use Criteria for Diagnostic Catheterization Home / Select Test of Interest Indication for RHC Cardiomyopathies Cardiomyopathies (Right and Left Heart Catheterization OR Right Heart Catheterization Alone With/Wit  Cardiomyopathies  (Right and Left Heart Catheterization OR  Right Heart Catheterization Alone With/Without Left Ventriculography and Coronary Angiography)  Link Here: [email or web address] Indication:  1. Known or suspected cardiomyopathy with or without heart failure A (7) Indication: 93; Score 7     Jermya Dowding J Callahan Peddie

## 2017-09-11 NOTE — Discharge Instructions (Signed)

## 2017-09-12 ENCOUNTER — Encounter (HOSPITAL_COMMUNITY): Payer: Self-pay | Admitting: Cardiology

## 2017-09-14 DIAGNOSIS — I420 Dilated cardiomyopathy: Secondary | ICD-10-CM | POA: Diagnosis not present

## 2017-09-18 DIAGNOSIS — I420 Dilated cardiomyopathy: Secondary | ICD-10-CM | POA: Diagnosis not present

## 2017-09-18 DIAGNOSIS — Z0189 Encounter for other specified special examinations: Secondary | ICD-10-CM | POA: Diagnosis not present

## 2017-09-18 DIAGNOSIS — N183 Chronic kidney disease, stage 3 (moderate): Secondary | ICD-10-CM | POA: Diagnosis not present

## 2017-10-02 DIAGNOSIS — I420 Dilated cardiomyopathy: Secondary | ICD-10-CM | POA: Diagnosis not present

## 2017-10-04 DIAGNOSIS — N183 Chronic kidney disease, stage 3 (moderate): Secondary | ICD-10-CM | POA: Diagnosis not present

## 2017-10-04 DIAGNOSIS — Z0189 Encounter for other specified special examinations: Secondary | ICD-10-CM | POA: Diagnosis not present

## 2017-10-04 DIAGNOSIS — I428 Other cardiomyopathies: Secondary | ICD-10-CM | POA: Diagnosis not present

## 2017-10-09 DIAGNOSIS — I5040 Unspecified combined systolic (congestive) and diastolic (congestive) heart failure: Secondary | ICD-10-CM | POA: Diagnosis not present

## 2017-10-09 DIAGNOSIS — R609 Edema, unspecified: Secondary | ICD-10-CM | POA: Diagnosis not present

## 2017-10-09 DIAGNOSIS — N189 Chronic kidney disease, unspecified: Secondary | ICD-10-CM | POA: Diagnosis not present

## 2017-10-25 DIAGNOSIS — I428 Other cardiomyopathies: Secondary | ICD-10-CM | POA: Diagnosis not present

## 2017-11-02 DIAGNOSIS — Z0189 Encounter for other specified special examinations: Secondary | ICD-10-CM | POA: Diagnosis not present

## 2017-11-02 DIAGNOSIS — I428 Other cardiomyopathies: Secondary | ICD-10-CM | POA: Diagnosis not present

## 2017-11-02 DIAGNOSIS — N183 Chronic kidney disease, stage 3 (moderate): Secondary | ICD-10-CM | POA: Diagnosis not present

## 2017-11-13 DIAGNOSIS — I428 Other cardiomyopathies: Secondary | ICD-10-CM | POA: Diagnosis not present

## 2017-11-16 DIAGNOSIS — N183 Chronic kidney disease, stage 3 (moderate): Secondary | ICD-10-CM | POA: Diagnosis not present

## 2017-11-16 DIAGNOSIS — I428 Other cardiomyopathies: Secondary | ICD-10-CM | POA: Diagnosis not present

## 2017-11-16 DIAGNOSIS — Z0189 Encounter for other specified special examinations: Secondary | ICD-10-CM | POA: Diagnosis not present

## 2017-11-24 DIAGNOSIS — I428 Other cardiomyopathies: Secondary | ICD-10-CM | POA: Diagnosis not present

## 2017-11-30 DIAGNOSIS — I428 Other cardiomyopathies: Secondary | ICD-10-CM | POA: Diagnosis not present

## 2017-11-30 DIAGNOSIS — N183 Chronic kidney disease, stage 3 (moderate): Secondary | ICD-10-CM | POA: Diagnosis not present

## 2017-11-30 DIAGNOSIS — Z0189 Encounter for other specified special examinations: Secondary | ICD-10-CM | POA: Diagnosis not present

## 2018-01-08 DIAGNOSIS — I5040 Unspecified combined systolic (congestive) and diastolic (congestive) heart failure: Secondary | ICD-10-CM | POA: Diagnosis not present

## 2018-01-08 DIAGNOSIS — R1310 Dysphagia, unspecified: Secondary | ICD-10-CM | POA: Diagnosis not present

## 2018-01-08 DIAGNOSIS — I272 Pulmonary hypertension, unspecified: Secondary | ICD-10-CM | POA: Diagnosis not present

## 2018-01-09 ENCOUNTER — Other Ambulatory Visit (HOSPITAL_COMMUNITY): Payer: Self-pay | Admitting: Family Medicine

## 2018-01-09 DIAGNOSIS — R1310 Dysphagia, unspecified: Secondary | ICD-10-CM

## 2018-01-12 ENCOUNTER — Ambulatory Visit (HOSPITAL_COMMUNITY)
Admission: RE | Admit: 2018-01-12 | Discharge: 2018-01-12 | Disposition: A | Discharge status: Home / Self Care | Payer: PPO | Source: Ambulatory Visit | Attending: Family Medicine | Admitting: Family Medicine

## 2018-01-12 DIAGNOSIS — R1310 Dysphagia, unspecified: Secondary | ICD-10-CM

## 2018-01-12 DIAGNOSIS — K449 Diaphragmatic hernia without obstruction or gangrene: Secondary | ICD-10-CM | POA: Insufficient documentation

## 2018-01-31 DIAGNOSIS — R1310 Dysphagia, unspecified: Secondary | ICD-10-CM | POA: Diagnosis not present

## 2018-03-05 DIAGNOSIS — N189 Chronic kidney disease, unspecified: Secondary | ICD-10-CM | POA: Diagnosis not present

## 2018-03-05 DIAGNOSIS — R1310 Dysphagia, unspecified: Secondary | ICD-10-CM | POA: Diagnosis not present

## 2018-03-05 DIAGNOSIS — K222 Esophageal obstruction: Secondary | ICD-10-CM | POA: Diagnosis not present

## 2018-03-16 DIAGNOSIS — R1310 Dysphagia, unspecified: Secondary | ICD-10-CM | POA: Diagnosis not present

## 2018-03-16 DIAGNOSIS — K219 Gastro-esophageal reflux disease without esophagitis: Secondary | ICD-10-CM | POA: Diagnosis not present

## 2018-04-17 DIAGNOSIS — I5040 Unspecified combined systolic (congestive) and diastolic (congestive) heart failure: Secondary | ICD-10-CM | POA: Diagnosis not present

## 2018-04-17 DIAGNOSIS — N189 Chronic kidney disease, unspecified: Secondary | ICD-10-CM | POA: Diagnosis not present

## 2018-04-17 DIAGNOSIS — K222 Esophageal obstruction: Secondary | ICD-10-CM | POA: Diagnosis not present

## 2018-04-17 DIAGNOSIS — Z6827 Body mass index (BMI) 27.0-27.9, adult: Secondary | ICD-10-CM | POA: Diagnosis not present

## 2018-05-15 DIAGNOSIS — R12 Heartburn: Secondary | ICD-10-CM | POA: Diagnosis not present

## 2018-05-15 DIAGNOSIS — K2980 Duodenitis without bleeding: Secondary | ICD-10-CM | POA: Diagnosis not present

## 2018-05-15 DIAGNOSIS — K449 Diaphragmatic hernia without obstruction or gangrene: Secondary | ICD-10-CM | POA: Diagnosis not present

## 2018-05-15 DIAGNOSIS — K210 Gastro-esophageal reflux disease with esophagitis: Secondary | ICD-10-CM | POA: Diagnosis not present

## 2018-05-15 DIAGNOSIS — R1310 Dysphagia, unspecified: Secondary | ICD-10-CM | POA: Diagnosis not present

## 2018-05-18 DIAGNOSIS — K2980 Duodenitis without bleeding: Secondary | ICD-10-CM | POA: Diagnosis not present

## 2018-06-28 ENCOUNTER — Other Ambulatory Visit: Payer: Self-pay

## 2018-06-28 ENCOUNTER — Emergency Department (HOSPITAL_COMMUNITY): Payer: PPO

## 2018-06-28 ENCOUNTER — Encounter (HOSPITAL_COMMUNITY): Payer: Self-pay

## 2018-06-28 ENCOUNTER — Inpatient Hospital Stay (HOSPITAL_COMMUNITY)
Admission: EM | Admit: 2018-06-28 | Discharge: 2018-07-01 | DRG: 292 | Disposition: A | Discharge status: Home Health | Payer: PPO | Attending: Internal Medicine | Admitting: Internal Medicine

## 2018-06-28 DIAGNOSIS — I5043 Acute on chronic combined systolic (congestive) and diastolic (congestive) heart failure: Principal | ICD-10-CM | POA: Diagnosis present

## 2018-06-28 DIAGNOSIS — I499 Cardiac arrhythmia, unspecified: Secondary | ICD-10-CM | POA: Diagnosis present

## 2018-06-28 DIAGNOSIS — Z6829 Body mass index (BMI) 29.0-29.9, adult: Secondary | ICD-10-CM | POA: Diagnosis not present

## 2018-06-28 DIAGNOSIS — I420 Dilated cardiomyopathy: Secondary | ICD-10-CM | POA: Diagnosis not present

## 2018-06-28 DIAGNOSIS — I5023 Acute on chronic systolic (congestive) heart failure: Secondary | ICD-10-CM

## 2018-06-28 DIAGNOSIS — Z880 Allergy status to penicillin: Secondary | ICD-10-CM | POA: Diagnosis not present

## 2018-06-28 DIAGNOSIS — Z9114 Patient's other noncompliance with medication regimen: Secondary | ICD-10-CM | POA: Diagnosis not present

## 2018-06-28 DIAGNOSIS — I272 Pulmonary hypertension, unspecified: Secondary | ICD-10-CM | POA: Diagnosis not present

## 2018-06-28 DIAGNOSIS — I498 Other specified cardiac arrhythmias: Secondary | ICD-10-CM | POA: Diagnosis not present

## 2018-06-28 DIAGNOSIS — I472 Ventricular tachycardia: Secondary | ICD-10-CM | POA: Diagnosis present

## 2018-06-28 DIAGNOSIS — N189 Chronic kidney disease, unspecified: Secondary | ICD-10-CM | POA: Diagnosis present

## 2018-06-28 DIAGNOSIS — N179 Acute kidney failure, unspecified: Secondary | ICD-10-CM | POA: Diagnosis present

## 2018-06-28 DIAGNOSIS — R0602 Shortness of breath: Secondary | ICD-10-CM | POA: Diagnosis not present

## 2018-06-28 DIAGNOSIS — I493 Ventricular premature depolarization: Secondary | ICD-10-CM | POA: Diagnosis present

## 2018-06-28 DIAGNOSIS — Z79899 Other long term (current) drug therapy: Secondary | ICD-10-CM

## 2018-06-28 DIAGNOSIS — R079 Chest pain, unspecified: Secondary | ICD-10-CM | POA: Diagnosis present

## 2018-06-28 DIAGNOSIS — K5900 Constipation, unspecified: Secondary | ICD-10-CM | POA: Diagnosis present

## 2018-06-28 DIAGNOSIS — D649 Anemia, unspecified: Secondary | ICD-10-CM | POA: Diagnosis not present

## 2018-06-28 DIAGNOSIS — I110 Hypertensive heart disease with heart failure: Secondary | ICD-10-CM | POA: Diagnosis not present

## 2018-06-28 DIAGNOSIS — J9811 Atelectasis: Secondary | ICD-10-CM | POA: Diagnosis not present

## 2018-06-28 DIAGNOSIS — Z23 Encounter for immunization: Secondary | ICD-10-CM

## 2018-06-28 DIAGNOSIS — I4891 Unspecified atrial fibrillation: Secondary | ICD-10-CM | POA: Diagnosis not present

## 2018-06-28 DIAGNOSIS — N183 Chronic kidney disease, stage 3 (moderate): Secondary | ICD-10-CM | POA: Diagnosis present

## 2018-06-28 DIAGNOSIS — D631 Anemia in chronic kidney disease: Secondary | ICD-10-CM | POA: Diagnosis not present

## 2018-06-28 DIAGNOSIS — I509 Heart failure, unspecified: Secondary | ICD-10-CM

## 2018-06-28 DIAGNOSIS — R0600 Dyspnea, unspecified: Secondary | ICD-10-CM | POA: Diagnosis not present

## 2018-06-28 DIAGNOSIS — J90 Pleural effusion, not elsewhere classified: Secondary | ICD-10-CM | POA: Diagnosis not present

## 2018-06-28 DIAGNOSIS — M189 Osteoarthritis of first carpometacarpal joint, unspecified: Secondary | ICD-10-CM | POA: Diagnosis not present

## 2018-06-28 DIAGNOSIS — I129 Hypertensive chronic kidney disease with stage 1 through stage 4 chronic kidney disease, or unspecified chronic kidney disease: Secondary | ICD-10-CM | POA: Diagnosis not present

## 2018-06-28 DIAGNOSIS — R609 Edema, unspecified: Secondary | ICD-10-CM | POA: Diagnosis not present

## 2018-06-28 DIAGNOSIS — I428 Other cardiomyopathies: Secondary | ICD-10-CM | POA: Diagnosis not present

## 2018-06-28 DIAGNOSIS — I5040 Unspecified combined systolic (congestive) and diastolic (congestive) heart failure: Secondary | ICD-10-CM | POA: Diagnosis not present

## 2018-06-28 LAB — CBC
HCT: 34.6 % — ABNORMAL LOW (ref 39.0–52.0)
Hemoglobin: 10.9 g/dL — ABNORMAL LOW (ref 13.0–17.0)
MCH: 30.6 pg (ref 26.0–34.0)
MCHC: 31.5 g/dL (ref 30.0–36.0)
MCV: 97.2 fL (ref 80.0–100.0)
PLATELETS: 157 10*3/uL (ref 150–400)
RBC: 3.56 MIL/uL — ABNORMAL LOW (ref 4.22–5.81)
RDW: 14.6 % (ref 11.5–15.5)
WBC: 4.3 10*3/uL (ref 4.0–10.5)
nRBC: 0 % (ref 0.0–0.2)

## 2018-06-28 LAB — BASIC METABOLIC PANEL
Anion gap: 8 (ref 5–15)
BUN: 26 mg/dL — AB (ref 8–23)
CO2: 20 mmol/L — ABNORMAL LOW (ref 22–32)
CREATININE: 1.95 mg/dL — AB (ref 0.61–1.24)
Calcium: 9 mg/dL (ref 8.9–10.3)
Chloride: 114 mmol/L — ABNORMAL HIGH (ref 98–111)
GFR calc Af Amer: 38 mL/min — ABNORMAL LOW (ref >60)
GFR, EST NON AFRICAN AMERICAN: 33 mL/min — AB (ref >60)
Glucose, Bld: 106 mg/dL — ABNORMAL HIGH (ref 70–99)
Potassium: 3.9 mmol/L (ref 3.5–5.1)
SODIUM: 142 mmol/L (ref 135–145)

## 2018-06-28 NOTE — ED Notes (Signed)
Reported pt.heartrate to 145.to charge nurse

## 2018-06-28 NOTE — ED Triage Notes (Signed)
Pt was sent by PCP for abnormal EKG, showed A Fib from PCP.  A&Ox4.  Increased shortness of breath.

## 2018-06-28 NOTE — ED Provider Notes (Signed)
Patient placed in Quick Look pathway, seen and evaluated   Chief Complaint: shortness of breath  HPI: Mark Baird is a 72 y.o. male who present to the ED with shortness of breath. Patient reports going to his PCP today and being sent here for further evaluation. Office EKG A Fib.  ROS: Resp: shortness of breath  Physical Exam:  BP 119/90   Pulse 76   Temp (!) 97.4 F (36.3 C) (Oral)   Resp (!) 24   Ht 6' (1.829 m)   Wt 86.4 kg   SpO2 97%   BMI 25.84 kg/m    Gen: No distress  Neuro: Awake and Alert  Skin: Warm and dry  Resp: decreased breath sounds, occasional rales, lower lungs  Heart: irregular with PVC's   Initiation of care has begun. The patient has been counseled on the process, plan, and necessity for staying for the completion/evaluation, and the remainder of the medical screening examination    Ashley Murrain, NP 06/29/18 [place], Wenda Overland, MD 07/01/18 1017

## 2018-06-29 ENCOUNTER — Encounter (HOSPITAL_COMMUNITY): Payer: Self-pay | Admitting: Family Medicine

## 2018-06-29 ENCOUNTER — Observation Stay (HOSPITAL_COMMUNITY): Payer: PPO

## 2018-06-29 DIAGNOSIS — I5043 Acute on chronic combined systolic (congestive) and diastolic (congestive) heart failure: Principal | ICD-10-CM

## 2018-06-29 DIAGNOSIS — R079 Chest pain, unspecified: Secondary | ICD-10-CM

## 2018-06-29 DIAGNOSIS — I499 Cardiac arrhythmia, unspecified: Secondary | ICD-10-CM | POA: Diagnosis present

## 2018-06-29 DIAGNOSIS — R0602 Shortness of breath: Secondary | ICD-10-CM | POA: Diagnosis not present

## 2018-06-29 DIAGNOSIS — N189 Chronic kidney disease, unspecified: Secondary | ICD-10-CM

## 2018-06-29 DIAGNOSIS — D649 Anemia, unspecified: Secondary | ICD-10-CM | POA: Diagnosis present

## 2018-06-29 DIAGNOSIS — I498 Other specified cardiac arrhythmias: Secondary | ICD-10-CM

## 2018-06-29 LAB — I-STAT TROPONIN, ED: Troponin i, poc: 0.08 ng/mL (ref 0.00–0.08)

## 2018-06-29 LAB — BRAIN NATRIURETIC PEPTIDE: B Natriuretic Peptide: 3445.4 pg/mL — ABNORMAL HIGH (ref 0.0–100.0)

## 2018-06-29 LAB — D-DIMER, QUANTITATIVE: D-Dimer, Quant: 0.72 ug/mL-FEU — ABNORMAL HIGH (ref 0.00–0.50)

## 2018-06-29 LAB — GLUCOSE, CAPILLARY
Glucose-Capillary: 88 mg/dL (ref 70–99)
Glucose-Capillary: 107 mg/dL — ABNORMAL HIGH (ref 70–99)
Glucose-Capillary: 115 mg/dL — ABNORMAL HIGH (ref 70–99)
Glucose-Capillary: 96 mg/dL (ref 70–99)

## 2018-06-29 LAB — TROPONIN I
Troponin I: 0.06 ng/mL (ref <0.03)
Troponin I: 0.06 ng/mL (ref <0.03)

## 2018-06-29 MED ORDER — ACETAMINOPHEN 325 MG PO TABS: 650.0000 mg | ORAL_TABLET | ORAL | Status: DC | PRN

## 2018-06-29 MED ORDER — FUROSEMIDE 10 MG/ML IJ SOLN
40.0000 mg | Freq: Two times a day (BID) | INTRAMUSCULAR | Status: DC
  Administered 2018-06-29 – 2018-06-30 (×3): 40 mg via INTRAVENOUS
  Filled 2018-06-29 (×3): qty 4

## 2018-06-29 MED ORDER — HEPARIN SODIUM (PORCINE) 5000 UNIT/ML IJ SOLN
5000.0000 [IU] | Freq: Three times a day (TID) | INTRAMUSCULAR | Status: DC
  Administered 2018-06-29 – 2018-07-01 (×6): 5000 [IU] via SUBCUTANEOUS
  Filled 2018-06-29 (×7): qty 1

## 2018-06-29 MED ORDER — FUROSEMIDE 10 MG/ML IJ SOLN
40.0000 mg | Freq: Once | INTRAMUSCULAR | Status: AC
  Administered 2018-06-29: 40 mg via INTRAVENOUS
  Filled 2018-06-29: qty 4

## 2018-06-29 MED ORDER — SODIUM CHLORIDE 0.9% FLUSH: 3.0000 mL | INTRAVENOUS | Status: DC | PRN

## 2018-06-29 MED ORDER — CARVEDILOL 6.25 MG PO TABS
6.2500 mg | ORAL_TABLET | Freq: Two times a day (BID) | ORAL | Status: DC
  Administered 2018-06-29 – 2018-07-01 (×5): 6.25 mg via ORAL
  Filled 2018-06-29 (×5): qty 1

## 2018-06-29 MED ORDER — ONDANSETRON HCL 4 MG/2ML IJ SOLN: 4.0000 mg | Freq: Four times a day (QID) | INTRAMUSCULAR | Status: DC | PRN

## 2018-06-29 MED ORDER — TECHNETIUM TC 99M DIETHYLENETRIAME-PENTAACETIC ACID: 31.0000 | Freq: Once | INTRAVENOUS | Status: DC | PRN

## 2018-06-29 MED ORDER — SODIUM CHLORIDE 0.9 % IV SOLN: 250.0000 mL | INTRAVENOUS | Status: DC | PRN

## 2018-06-29 MED ORDER — ASPIRIN 81 MG PO CHEW
324.0000 mg | CHEWABLE_TABLET | Freq: Once | ORAL | Status: AC
  Administered 2018-06-29: 324 mg via ORAL
  Filled 2018-06-29: qty 4

## 2018-06-29 MED ORDER — PNEUMOCOCCAL VAC POLYVALENT 25 MCG/0.5ML IJ INJ
0.5000 mL | INJECTION | INTRAMUSCULAR | Status: AC
  Administered 2018-06-30: 0.5 mL via INTRAMUSCULAR
  Filled 2018-06-29: qty 0.5

## 2018-06-29 MED ORDER — SODIUM CHLORIDE 0.9% FLUSH
3.0000 mL | Freq: Two times a day (BID) | INTRAVENOUS | Status: DC
  Administered 2018-06-29 – 2018-07-01 (×5): 3 mL via INTRAVENOUS

## 2018-06-29 MED ORDER — TECHNETIUM TO 99M ALBUMIN AGGREGATED: 4.0000 | Freq: Once | INTRAVENOUS | Status: DC | PRN

## 2018-06-29 NOTE — Progress Notes (Signed)
  Patient admitted by Dr Myna Hidalgo today morning for signs of fluid overload and CHF exacerbation.  Patient tells me he stopped taking his medications few months ago as he thought it was getting very confusing on which medications he needed to be on and they think they were interacting.  He did not seek medical attention until yesterday when he progressively became short of breath.  This morning feels slightly better but still has exertional dyspnea even with minimal ambulation.  His vital signs appears to be relatively stable at this time He still has 1-2+ bilateral lower extremity pitting edema, diminished breath sounds at bilateral bases.  10-12 cm JVD.  Echocardiogram from August 2018 shows ejection fraction 29-79%, grade 3 diastolic dysfunction  Assessment and plan 1) Acute on chronic combined systolic and diastolic congestive heart failure, ejection fraction 89-21%, grade 3 diastolic dysfunction, class III -2)CKD stage III 3)Normocytic anemia  -At this time continue patient with aggressive IV diuresis.  I have spoken with Dr. Einar Gip from cardiology who will see the patient and assist with his cardiac medications.  Place him on fluid restriction, replete electrolytes aggressively as necessary.  In the meantime call with any questions as necessary.  Gerlean Ren MD [facility]

## 2018-06-29 NOTE — Care Management Note (Signed)
Case Management Note  Patient Details  Name: Raheim Beutler. MRN: [id number] Date of Birth: 11-Apr-1946  Subjective/Objective:   CHF                Action/Plan: Patient lives at home; PCP: Lucianne Lei, MD; has private insurance with [employer] with prescription drug coverage; awaiting for Physical Therapy eval for disposition needs; CM will continue to follow for progression of care.  Expected Discharge Date:    possibly 07/03/2018              Expected Discharge Plan:  Home/Self Care  In-House Referral:   [facility]  Discharge planning Services  CM Consult  Status of Service:  In process, will continue to follow  Sherrilyn Rist [phone] 06/29/2018, 10:39 AM

## 2018-06-29 NOTE — ED Notes (Signed)
Ambulated pt in hallway, pt's sats stayed within normal limits. Pt's respiration rate went up while ambulating, no drop in o2 sats. Notified Bobby(RN)
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
## 2018-06-29 NOTE — Progress Notes (Signed)
CRITICAL VALUE ALERT  Critical Value:  Troponin= 0.06  Date & Time Notied:  06/29/18  0835  Provider Notified: yes  Orders Received/Actions taken: no new orders

## 2018-06-29 NOTE — Consult Note (Signed)
CARDIOLOGY CONSULT NOTE  Patient ID: Mark Baird. MRN: [id number] DOB/AGE: 1945/12/10 72 y.o.  Admit date: 06/28/2018 Referring Physician  Gerlean Ren, MD Primary Physician:  Lucianne Lei, MD Reason for Consultation  CHF  HPI: Mark Baird.  is a 72 y.o. 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  Patient continues to have worsening dyspnea over the past 1 to 2 months, has not been on medications for quite some time.  Dyspnea is described as severe, doing given routine activities he had noticed dyspnea.  He is also started noticing leg edema and abdominal bloating and weight gain.  Occasional episodes of chest tightness.  He was evaluated in the emergency room, found to have acute decompensated heart failure and is being admitted for further evaluation.  He is feeling better today compared to admission.  Past Medical History:  Diagnosis Date  . Cancer [facility])      Past Surgical History:  Procedure Laterality Date  . RIGHT/LEFT HEART CATH AND CORONARY ANGIOGRAPHY N/A 09/11/2017   Procedure: RIGHT/LEFT HEART CATH AND CORONARY ANGIOGRAPHY;  Surgeon: Nigel Mormon, MD;  Location: [facility];  Service: Cardiovascular;  Laterality: N/A;     History reviewed. No pertinent family history.   Social History: Social History   Socioeconomic History  . Marital status: Married    Spouse name: Not on file  . Number of children: Not on file  . Years of education: Not on file  . Highest education level: Not on file  Occupational History  . Not on file  Social Needs  . Financial resource strain: Not on file  . Food insecurity:    Worry: Not on file    Inability: Not on file  . Transportation needs:    Medical: Not on  file    Non-medical: Not on file  Tobacco Use  . Smoking status: Never Smoker  . Smokeless tobacco: Never Used  Substance and Sexual Activity  . Alcohol use: No  . Drug use: Not on file  . Sexual activity: Not on file  Lifestyle  . Physical activity:    Days per week: Not on file    Minutes per session: Not on file  . Stress: Not on file  Relationships  . Social connections:    Talks on phone: Not on file    Gets together: Not on file    Attends religious service: Not on file    Active member of club or organization: Not on file    Attends meetings of clubs or organizations: Not on file    Relationship status: Not on file  . Intimate partner violence:    Fear of current or ex partner: Not on file    Emotionally abused: Not on file    Physically abused: Not on file    Forced sexual activity: Not on file  Other Topics Concern  . Not on file  Social History Narrative  . Not on file     Medications Prior to Admission  Medication Sig Dispense Refill Last Dose  . carvedilol (COREG) 12.5 MG tablet Take 12.5 mg by mouth 2 (two) times daily with a meal.   More than a month at Unknown time  . sacubitril-valsartan (ENTRESTO) 97-103 MG Take 1 tablet by  mouth 2 (two) times daily.   More than a month at Unknown time  . spironolactone (ALDACTONE) 25 MG tablet Take 25 mg by mouth 2 (two) times daily.   More than a month at Unknown time  . torsemide (DEMADEX) 20 MG tablet Take 20 mg by mouth daily.   More than a month at Unknown time   Review of Systems  Constitutional: Negative.   HENT: Negative.   Respiratory: Positive for shortness of breath and wheezing. Negative for cough, hemoptysis and sputum production.   Cardiovascular: Positive for chest pain and leg swelling. Negative for palpitations, orthopnea, claudication and PND.  Gastrointestinal: Negative.   Genitourinary: Negative.   Musculoskeletal: Negative.   Skin: Negative.   All other systems reviewed and are  negative.    Physical Exam: Blood pressure 100/69, pulse 72, temperature 97.8 F (36.6 C), temperature source Oral, resp. rate 18, height 6' (1.829 m), weight 86.4 kg, SpO2 99 %.   Physical Exam  Constitutional: He is oriented to person, place, and time. He appears well-developed and well-nourished. No distress.  HENT:  Head: Atraumatic.  Eyes: Conjunctivae are normal.  Neck: JVD present.  Cardiovascular: Normal rate and intact distal pulses. Exam reveals no gallop and no friction rub.  No murmur heard. Pulmonary/Chest: Effort normal. He has rales (Bilateral basal).  Abdominal: Soft. Bowel sounds are normal. He exhibits no mass. There is no tenderness. There is no rebound and no guarding.  Musculoskeletal: He exhibits edema (2 plus pitting below knee).  Neurological: He is alert and oriented to person, place, and time.  Skin: Skin is warm and dry.  Psychiatric: He has a normal mood and affect.    Labs: CBC Latest Ref Rng & Units 06/28/2018 10/11/2010  WBC 4.0 - 10.5 K/uL 4.3 3.9(L)  Hemoglobin 13.0 - 17.0 g/dL 10.9(L) 11.7(L)  Hematocrit 39.0 - 52.0 % 34.6(L) 33.6(L)  Platelets 150 - 400 K/uL 157 144       BMP Latest Ref Rng & Units 06/28/2018 10/11/2010  Glucose 70 - 99 mg/dL 106(H) 118(H)  BUN 8 - 23 mg/dL 26(H) 21  Creatinine 0.61 - 1.24 mg/dL 1.95(H) 1.39  Sodium 135 - 145 mmol/L 142 141  Potassium 3.5 - 5.1 mmol/L 3.9 3.8  Chloride 98 - 111 mmol/L 114(H) 105  CO2 22 - 32 mmol/L 20(L) 25  Calcium 8.9 - 10.3 mg/dL 9.0 9.2    BNP (last 3 results) Recent Labs    06/29/18 0201  BNP 3,445.4*  Cardiac Panel (last 3 results) Recent Labs    06/29/18 0608  TROPONINI 0.06*     Radiology: Dg Chest 2 View  Result Date: 06/28/2018 CLINICAL DATA:  Shortness of breath EXAM: CHEST - 2 VIEW COMPARISON:  None. FINDINGS: Cardiomegaly with vascular congestion. Small bilateral pleural effusions and bibasilar atelectasis. No overt edema or acute bony abnormality. IMPRESSION:  Cardiomegaly, vascular congestion. Small effusions with bibasilar atelectasis. Electronically Signed   By: Rolm Baptise M.D.   On: 06/28/2018 19:58    Scheduled Meds: . carvedilol  6.25 mg Oral BID WC  . furosemide  40 mg Intravenous BID  . heparin  5,000 Units Subcutaneous Q8H  . [START ON 06/30/2018] pneumococcal 23 valent vaccine  0.5 mL Intramuscular Tomorrow-1000  . sodium chloride flush  3 mL Intravenous Q12H   Continuous Infusions: . sodium chloride     PRN Meds:.sodium chloride, acetaminophen, ondansetron (ZOFRAN) IV, sodium chloride flush  CARDIAC STUDIES:  EKG 06/28/2018: Normal sinus rhythm with rate of 85 bpm with  frequent PACs with aberrantly conducted beats, occasional PVCs.  Echocardiogram 04/13/2017: Severe LV systolic dysfunction, EF 25 to 30% with severe diffuse hypokinesis with akinesis of the inferolateral myocardium.  Restrictive physiology.  Moderate to severe left atrial enlargement, moderately dilated right ventricle with moderate pulmonary hypertension.  ASSESSMENT AND PLAN:  1.  Acute systolic and diastolic heart failure. 2.  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  Chronic stage III kidney disease, with acute renal failure  Recommendation: He is presently doing well with IV furosemide and carvedilol. Agree on holding Ace inhibitors or ARB for now. I'll follow-up on echocardiogram.  Tomorrow, the could introduce ace inhibitors or ARB, probably restart Entresto at low dose, depending upon his serum creatinine.  He still in acute decompensated heart failure with elevated JVD a bibasilar crackles but overall much improved with improvement in edema.  Adrian Prows, MD 06/29/2018, 11:35 AM [facility] [place] Pager: [phone] Office: [phone] If no answer Cell [phone]

## 2018-06-29 NOTE — ED Notes (Signed)
Attempted to call report to floor 

## 2018-06-29 NOTE — ED Provider Notes (Signed)
TIME SEEN: 1:27 AM  CHIEF COMPLAINT: Chest pain, shortness of breath  HPI: Patient is a 72 year old male with history of CHF who presents to the emergency department with 4 days of shortness of breath with exertion, chest tightness with exertion and a "sore stomach".  He was seen as an outpatient today and had an EKG that he states was concerning for atrial fibrillation.  No history of the same.  No lower extremity swelling or pain.  No history of PE or DVT.  ROS: See HPI Constitutional: no fever  Eyes: no drainage  ENT: no runny nose   Cardiovascular:  chest pain  Resp: SOB  GI: no vomiting GU: no dysuria Integumentary: no rash  Allergy: no hives  Musculoskeletal: no leg swelling  Neurological: no slurred speech ROS otherwise negative  PAST MEDICAL HISTORY/PAST SURGICAL HISTORY:  Past Medical History:  Diagnosis Date  . Cancer [facility])     MEDICATIONS:  Prior to Admission medications   Medication Sig Start Date End Date Taking? Authorizing Provider  aspirin EC 81 MG tablet Take 81 mg by mouth daily.    [provider]  carvedilol (COREG) 6.25 MG tablet Take 6.25 mg by mouth 2 (two) times daily. 07/24/17   [provider]  ENTRESTO 97-103 MG Take 1 tablet by mouth 2 (two) times daily. 06/17/17   [provider]  pravastatin (PRAVACHOL) 20 MG tablet Take 20 mg by mouth daily after supper. 06/17/17   [provider]  spironolactone (ALDACTONE) 25 MG tablet Take 25 mg by mouth 2 (two) times daily. 04/26/17   [provider]    ALLERGIES:  Allergies  Allergen Reactions  . Molds & Smuts Other (See Comments)    Due to allergy testing.  Marland Kitchen Penicillins Swelling    Has patient had a PCN reaction causing immediate rash, facial/tongue/throat swelling, SOB or lightheadedness with hypotension: Yes Has patient had a PCN reaction causing severe rash involving mucus membranes or skin necrosis: No Has patient had a PCN reaction that required  hospitalization:No Has patient had a PCN reaction occurring within the last 10 years: No If all of the above answers are "NO", then may proceed with Cephalosporin use.     SOCIAL HISTORY:  Social History   Tobacco Use  . Smoking status: Never Smoker  . Smokeless tobacco: Never Used  Substance Use Topics  . Alcohol use: No    FAMILY HISTORY: History reviewed. No pertinent family history.  EXAM: BP (!) 148/83 (BP Location: Right Arm)   Pulse 84   Temp 97.9 F (36.6 C)   Resp 16   SpO2 92%  CONSTITUTIONAL: Alert and oriented and responds appropriately to questions. Well-appearing; well-nourished HEAD: Normocephalic EYES: Conjunctivae clear, pupils appear equal, EOMI ENT: normal nose; moist mucous membranes NECK: Supple, no meningismus, no nuchal rigidity, no LAD  CARD: RRR; S1 and S2 appreciated; no murmurs, no clicks, no rubs, no gallops RESP: Patient becomes tachypneic with exertion.  He has bibasilar crackles.  No wheezing or rhonchi.  No respiratory distress or hypoxia. ABD/GI: Normal bowel sounds; non-distended; soft, non-tender, no rebound, no guarding, no peritoneal signs, no hepatosplenomegaly BACK:  The back appears normal and is non-tender to palpation, there is no CVA tenderness EXT: Normal ROM in all joints; non-tender to palpation; no edema; normal capillary refill; no cyanosis, no calf tenderness or swelling    SKIN: Normal color for age and race; warm; no rash NEURO: Moves all extremities equally PSYCH: The patient's mood and manner are  appropriate. Grooming and personal hygiene are appropriate.  MEDICAL DECISION MAKING: Patient here with chest pain or shortness of breath.  He did have a cardiac catheterization on September 11, 2017 that showed no coronary artery disease.  His last echocardiogram in August 2018 showed an EF of 25 to 30%.  His lungs sound like he is volume overloaded here today.  Will obtain cardiac labs, BNP.  ED PROGRESS: Chest x-ray shows vascular  congestion but no overt edema although he seems clinically volume overloaded.  His BNP is 3400.  Troponin is negative.  Will give IV Lasix and discussed with medicine for admission.   3:09 AM Discussed patient's case with hospitalist, Dr. Myna Hidalgo.  I have recommended admission and patient (and family if present) agree with this plan. Admitting physician will place admission orders.   I reviewed all nursing notes, vitals, pertinent previous records, EKGs, lab and urine results, imaging (as available).    EKG Interpretation  Date/Time:  Thursday June 28 2018 18:52:15 EDT Ventricular Rate:  85 PR Interval:  174 QRS Duration: 132 QT Interval:  406 QTC Calculation: 483 R Axis:   81 Text Interpretation:  Sinus rhythm with marked sinus arrhythmia with frequent and consecutive Premature ventricular complexes Non-specific intra-ventricular conduction block Abnormal ECG No significant change since last tracing other than PVCs Confirmed by Pryor Curia [phone]) on 06/28/2018 11:37:48 PM         Ward, Delice Bison, DO 06/29/18 4695

## 2018-06-29 NOTE — Evaluation (Signed)
Physical Therapy Evaluation Patient Details Name: Mark Baird. MRN: [id number] DOB: Jan 21, 1946 Today's Date: 06/29/2018   History of Present Illness  Pt is a 72 y.o. male admitted 06/28/18 with progressive exertional dyspnea and chest discomfort. CXR notable for cardiomegaly, vascular congestion, and small effusions. Worked up for CHF exacerbation. PMH includes CHF, CKD. CA.    Clinical Impression  Pt presents with an overall decrease in functional mobility secondary to above. PTA, pt indep and lives alone. Today, pt able to ambulate short distance with supervision; limited by DOE 3-4/4. SpO2 >97% throughout treatment. Educ on decreased activity tolerance and energy conservation strategies (handout provided). Pt not interested in DME use for added stability and further energy conservation. Pt would benefit from continued acute PT services to maximize functional mobility and independence prior to d/c with HHPT services.     Follow Up Recommendations Home health PT;Supervision - Intermittent    Equipment Recommendations  None recommended by PT(pt declined)    Recommendations for Other Services       Precautions / Restrictions Precautions Precautions: Fall Restrictions Weight Bearing Restrictions: No      Mobility  Bed Mobility Overal bed mobility: Modified Independent             General bed mobility comments: HOB elevated  Transfers Overall transfer level: Needs assistance Equipment used: None Transfers: Sit to/from Stand Sit to Stand: Supervision            Ambulation/Gait Ambulation/Gait assistance: Min guard;Supervision Gait Distance (Feet): 80 Feet     Gait velocity: Decreased Gait velocity interpretation: 1.31 - 2.62 ft/sec, indicative of limited community ambulator General Gait Details: Initial amb 20' with min guard for balance; DOE 4/4 when pt attempting to talk. Seated rest break, then amb an additional 80', DOE 3/4, progressing to supervision for  balance. SpO2 98% on RA  Stairs            Wheelchair Mobility    Modified Rankin (Stroke Patients Only)       Balance Overall balance assessment: Needs assistance Sitting-balance support: No upper extremity supported Sitting balance-Leahy Scale: Good       Standing balance-Leahy Scale: Fair                               Pertinent Vitals/Pain Pain Assessment: No/denies pain    Home Living Family/patient expects to be discharged to:: Private residence Living Arrangements: Alone Available Help at Discharge: Friend(s);Available PRN/intermittently Type of Home: House Home Access: Stairs to enter Entrance Stairs-Rails: None Entrance Stairs-Number of Steps: 8 Home Layout: Two level;Bed/bath upstairs Home Equipment: None Additional Comments: Has group of friends he can call, "we check in on each other"    Prior Function Level of Independence: Independent         Comments: Enjoys walking in the park. Retired Optician, dispensing        Extremity/Trunk Assessment   Upper Extremity Assessment Upper Extremity Assessment: Overall WFL for tasks assessed    Lower Extremity Assessment Lower Extremity Assessment: Overall WFL for tasks assessed       Communication   Communication: No difficulties  Cognition Arousal/Alertness: Awake/alert Behavior During Therapy: WFL for tasks assessed/performed Overall Cognitive Status: Within Functional Limits for tasks assessed  General Comments      Exercises     Assessment/Plan    PT Assessment Patient needs continued PT services  PT Problem List Decreased activity tolerance;Decreased balance;Decreased mobility;Cardiopulmonary status limiting activity       PT Treatment Interventions DME instruction;Gait training;Stair training;Functional mobility training;Therapeutic activities;Therapeutic exercise;Balance training;Patient/family education     PT Goals (Current goals can be found in the Care Plan section)  Acute Rehab PT Goals Patient Stated Goal: Return home; not interested in DME for energy conservation PT Goal Formulation: With patient Time For Goal Achievement: 07/13/18 Potential to Achieve Goals: Good    Frequency Min 3X/week   Barriers to discharge Decreased caregiver support      Co-evaluation               AM-PAC PT "6 Clicks" Daily Activity  Outcome Measure Difficulty turning over in bed (including adjusting bedclothes, sheets and blankets)?: None Difficulty moving from lying on back to sitting on the side of the bed? : None Difficulty sitting down on and standing up from a chair with arms (e.g., wheelchair, bedside commode, etc,.)?: A Little Help needed moving to and from a bed to chair (including a wheelchair)?: A Little Help needed walking in hospital room?: A Little Help needed climbing 3-5 steps with a railing? : A Little 6 Click Score: 20    End of Session Equipment Utilized During Treatment: Gait belt Activity Tolerance: Patient tolerated treatment well;Treatment limited secondary to medical complications (Comment)(DOE) Patient left: in bed;with call bell/phone within reach Nurse Communication: Mobility status PT Visit Diagnosis: Other abnormalities of gait and mobility (R26.89)    Time: 4081-4481 PT Time Calculation (min) (ACUTE ONLY): 19 min   Charges:   PT Evaluation $PT Eval Moderate Complexity: [place], PT, DPT Acute Rehabilitation Services  Pager [phone] Office [place] 06/29/2018, 4:36 PM

## 2018-06-30 DIAGNOSIS — I509 Heart failure, unspecified: Secondary | ICD-10-CM

## 2018-06-30 DIAGNOSIS — Z79899 Other long term (current) drug therapy: Secondary | ICD-10-CM | POA: Diagnosis not present

## 2018-06-30 DIAGNOSIS — I472 Ventricular tachycardia: Secondary | ICD-10-CM | POA: Diagnosis present

## 2018-06-30 DIAGNOSIS — R0600 Dyspnea, unspecified: Secondary | ICD-10-CM | POA: Diagnosis present

## 2018-06-30 DIAGNOSIS — N183 Chronic kidney disease, stage 3 (moderate): Secondary | ICD-10-CM | POA: Diagnosis present

## 2018-06-30 DIAGNOSIS — K5900 Constipation, unspecified: Secondary | ICD-10-CM | POA: Diagnosis present

## 2018-06-30 DIAGNOSIS — N179 Acute kidney failure, unspecified: Secondary | ICD-10-CM | POA: Diagnosis present

## 2018-06-30 DIAGNOSIS — D631 Anemia in chronic kidney disease: Secondary | ICD-10-CM | POA: Diagnosis present

## 2018-06-30 DIAGNOSIS — I272 Pulmonary hypertension, unspecified: Secondary | ICD-10-CM | POA: Diagnosis present

## 2018-06-30 DIAGNOSIS — I5043 Acute on chronic combined systolic (congestive) and diastolic (congestive) heart failure: Secondary | ICD-10-CM | POA: Diagnosis present

## 2018-06-30 DIAGNOSIS — Z880 Allergy status to penicillin: Secondary | ICD-10-CM | POA: Diagnosis not present

## 2018-06-30 DIAGNOSIS — Z23 Encounter for immunization: Secondary | ICD-10-CM | POA: Diagnosis present

## 2018-06-30 DIAGNOSIS — I420 Dilated cardiomyopathy: Secondary | ICD-10-CM | POA: Diagnosis present

## 2018-06-30 DIAGNOSIS — I4891 Unspecified atrial fibrillation: Secondary | ICD-10-CM | POA: Diagnosis present

## 2018-06-30 DIAGNOSIS — I493 Ventricular premature depolarization: Secondary | ICD-10-CM | POA: Diagnosis present

## 2018-06-30 DIAGNOSIS — Z9114 Patient's other noncompliance with medication regimen: Secondary | ICD-10-CM | POA: Diagnosis not present

## 2018-06-30 LAB — GLUCOSE, CAPILLARY
GLUCOSE-CAPILLARY: 116 mg/dL — AB (ref 70–99)
GLUCOSE-CAPILLARY: 152 mg/dL — AB (ref 70–99)
Glucose-Capillary: 105 mg/dL — ABNORMAL HIGH (ref 70–99)
Glucose-Capillary: 109 mg/dL — ABNORMAL HIGH (ref 70–99)

## 2018-06-30 LAB — BRAIN NATRIURETIC PEPTIDE: B NATRIURETIC PEPTIDE 5: 2214.4 pg/mL — AB (ref 0.0–100.0)

## 2018-06-30 LAB — BASIC METABOLIC PANEL
Anion gap: 9 (ref 5–15)
BUN: 30 mg/dL — ABNORMAL HIGH (ref 8–23)
CALCIUM: 8.9 mg/dL (ref 8.9–10.3)
CO2: 24 mmol/L (ref 22–32)
CREATININE: 2.05 mg/dL — AB (ref 0.61–1.24)
Chloride: 107 mmol/L (ref 98–111)
GFR calc non Af Amer: 31 mL/min — ABNORMAL LOW (ref >60)
GFR, EST AFRICAN AMERICAN: 36 mL/min — AB (ref >60)
GLUCOSE: 100 mg/dL — AB (ref 70–99)
Potassium: 3.8 mmol/L (ref 3.5–5.1)
Sodium: 140 mmol/L (ref 135–145)

## 2018-06-30 LAB — MAGNESIUM: Magnesium: 1.9 mg/dL (ref 1.7–2.4)

## 2018-06-30 MED ORDER — FUROSEMIDE 40 MG PO TABS
40.0000 mg | ORAL_TABLET | Freq: Two times a day (BID) | ORAL | Status: DC
  Administered 2018-06-30 – 2018-07-01 (×2): 40 mg via ORAL
  Filled 2018-06-30 (×2): qty 1

## 2018-06-30 MED ORDER — POLYETHYLENE GLYCOL 3350 17 G PO PACK: 17.0000 g | PACK | Freq: Every day | ORAL | Status: DC | PRN

## 2018-06-30 MED ORDER — SACUBITRIL-VALSARTAN 24-26 MG PO TABS
1.0000 | ORAL_TABLET | Freq: Two times a day (BID) | ORAL | Status: DC
  Administered 2018-06-30 – 2018-07-01 (×3): 1 via ORAL
  Filled 2018-06-30 (×3): qty 1

## 2018-06-30 MED ORDER — LACTULOSE 10 GM/15ML PO SOLN
20.0000 g | Freq: Three times a day (TID) | ORAL | Status: AC
  Administered 2018-06-30 (×2): 20 g via ORAL
  Filled 2018-06-30 (×3): qty 30

## 2018-06-30 MED ORDER — SENNOSIDES-DOCUSATE SODIUM 8.6-50 MG PO TABS: 1.0000 | ORAL_TABLET | Freq: Every evening | ORAL | Status: DC | PRN

## 2018-06-30 NOTE — Progress Notes (Signed)
Pt had 6 beat run of Vtach. Pt asymptomatic. Provider notified. No new orders given.

## 2018-06-30 NOTE — Progress Notes (Signed)
PROGRESS NOTE    Mark Baird.  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  Patient stopped taking his medication several months ago.  He was found to be in fluid overload therefore admitted to the hospital for IV diuresis.   Assessment & Plan:   Principal Problem:   Acute on chronic combined systolic and diastolic CHF (congestive heart failure) (HCC) Active Problems:   Chronic renal insufficiency   Chest pain   Cardiac arrhythmia   Normocytic anemia  Acute on chronic combined systolic and diastolic congestive heart failure with ejection fraction percent, grade 3 diastolic dysfunction, grade 3 Dyspnea on exertion -Patient is slightly improving with aggressive IV diuresis.  We will plan on continuing aggressive diuresis with Lasix 40 mg 3 times daily today.  Closely monitor urine input and output, aggressively replete electrolytes as necessary - Maintain fluid restriction, appreciate input from cardiology -Coreg 6.25 mg twice daily added - Appropriate cardiac medication to be added per cardiology discretion as necessary.  Constipation -We will add bowel regimen.  Will also given lactulose today.  CKD stage III - Baseline creatinine probably 1.8.  Today's 2.0.  Closely monitor this in the setting of aggressive diuresis.  Anemia of chronic disease -Hemoglobin is stable at 10.9.  No obvious signs of bleeding noted.  DVT prophylaxis: Subcutaneous heparin Code Status: Full code Family Communication: None at bedside Disposition Plan: Maintain patient in the hospital  again as there is still requires aggressive scheduled IV diuresis.  Once he is close to his euvolemic status we will switch him over to oral diuretics and consolidate his cardiac medications.  Consultants:   Cardiology  Procedures:   None  Antimicrobials:   None   Subjective: Still has dyspnea on exertion but at rest he feels okay.  Review of Systems Otherwise negative except as per HPI, including: General: Denies fever, chills, night sweats or unintended weight loss. Resp: Denies cough, wheezing, shortness of breath. Cardiac: Denies chest pain, palpitations, orthopnea, paroxysmal nocturnal dyspnea. GI: Denies abdominal pain, nausea, vomiting, diarrhea or constipation GU: Denies dysuria, frequency, hesitancy or incontinence MS: Denies muscle aches, joint pain or swelling Neuro: Denies headache, neurologic deficits (focal weakness, numbness, tingling), abnormal gait Psych: Denies anxiety, depression, SI/HI/AVH Skin: Denies new rashes or lesions ID: Denies sick contacts, exotic exposures, travel  Objective: Vitals:   06/29/18 1239 06/29/18 1923 06/30/18 0018 06/30/18 0500  BP: 119/90 113/75 115/65 118/72  Pulse: 76 77 69 62  Resp: (!) 24 18 16 18   Temp: (!) 97.4 F (36.3 C) 97.7 F (36.5 C) 98 F (36.7 C) 98.3 F (36.8 C)  TempSrc: Oral Oral Oral Oral  SpO2: 97% 99% 96% 97%  Weight:    83.9 kg  Height:        Intake/Output Summary (Last 24 hours) at 06/30/2018 1237 Last data filed at 06/30/2018 0900 Gross per 24 hour  Intake 720 ml  Output 1400 ml  Net -680 ml   Filed Weights   06/29/18 0434 06/30/18 0500  Weight: 86.4 kg 83.9 kg    Examination:  General exam: Appears calm and comfortable  Respiratory system:  Bilateral diminished breath sounds at the bases Cardiovascular system: S1 & S2 heard, RRR. 10cm JVD, murmurs, rubs, gallops or clicks. No pedal edema. Gastrointestinal system: Abdomen is nondistended, soft and nontender. No organomegaly or masses  felt. Normal bowel sounds heard. Central nervous system: Alert and oriented. No focal neurological deficits. Extremities: Symmetric 5 x 5 power. Skin: No rashes, lesions or ulcers Psychiatry: Judgement and insight appear normal. Mood & affect appropriate.     Data Reviewed:   CBC: Recent Labs  Lab 06/28/18 1928  WBC 4.3  HGB 10.9*  HCT 34.6*  MCV 97.2  PLT 841   Basic Metabolic Panel: Recent Labs  Lab 06/28/18 1928 06/30/18 0552  NA 142 140  K 3.9 3.8  CL 114* 107  CO2 20* 24  GLUCOSE 106* 100*  BUN 26* 30*  CREATININE 1.95* 2.05*  CALCIUM 9.0 8.9  MG  --  1.9   GFR: Estimated Creatinine Clearance: 35.8 mL/min (A) (by C-G formula based on SCr of 2.05 mg/dL (H)). Liver Function Tests: No results for input(s): AST, ALT, ALKPHOS, BILITOT, PROT, ALBUMIN in the last 168 hours. No results for input(s): LIPASE, AMYLASE in the last 168 hours. No results for input(s): AMMONIA in the last 168 hours. Coagulation Profile: No results for input(s): INR, PROTIME in the last 168 hours. Cardiac Enzymes: Recent Labs  Lab 06/29/18 0608 06/29/18 1235  TROPONINI 0.06* 0.06*   BNP (last 3 results) No results for input(s): PROBNP in the last 8760 hours. HbA1C: No results for input(s): HGBA1C in the last 72 hours. CBG: Recent Labs  Lab 06/29/18 1237 06/29/18 1632 06/29/18 2207 06/30/18 0809 06/30/18 1214  GLUCAP 107* 115* 96 105* 116*   Lipid Profile: No results for input(s): CHOL, HDL, LDLCALC, TRIG, CHOLHDL, LDLDIRECT in the last 72 hours. Thyroid Function Tests: No results for input(s): TSH, T4TOTAL, FREET4, T3FREE, THYROIDAB in the last 72 hours. Anemia Panel: No results for input(s): VITAMINB12, FOLATE, FERRITIN, TIBC, IRON, RETICCTPCT in the last 72 hours. Sepsis Labs: No results for input(s): PROCALCITON, LATICACIDVEN in the last 168 hours.  No results found for this or any previous visit (from the past 240 hour(s)).       Radiology Studies: Dg Chest 2  View  Result Date: 06/28/2018 CLINICAL DATA:  Shortness of breath EXAM: CHEST - 2 VIEW COMPARISON:  None. FINDINGS: Cardiomegaly with vascular congestion. Small bilateral pleural effusions and bibasilar atelectasis. No overt edema or acute bony abnormality. IMPRESSION: Cardiomegaly, vascular congestion. Small effusions with bibasilar atelectasis. Electronically Signed   By: Rolm Baptise M.D.   On: 06/28/2018 19:58   Nm Pulmonary Perf And Vent  Result Date: 06/29/2018 CLINICAL DATA:  Chest pain.  Shortness of breath. EXAM: NUCLEAR MEDICINE VENTILATION - PERFUSION LUNG SCAN TECHNIQUE: Ventilation images were obtained in multiple projections using inhaled aerosol Tc-12m DTPA. Perfusion images were obtained in multiple projections after intravenous injection of Tc-59m-MAA. RADIOPHARMACEUTICALS:  32.0 mCi of Tc-52m DTPA aerosol inhalation and 4.1 mCi Tc36m-MAA IV COMPARISON:  Chest x-ray 06/28/2018. FINDINGS: Bilateral mild ventilatory defects without corresponding perfusion defects are noted. Given chest x-ray findings of CHF these findings are most likely secondary to CHF. No evidence of pulmonary embolus. IMPRESSION: No evidence of pulmonary embolus. Electronically Signed   By: Loco Hills   On: 06/29/2018 13:11        Scheduled Meds: . carvedilol  6.25 mg Oral BID WC  . furosemide  40 mg Intravenous BID  . heparin  5,000 Units Subcutaneous Q8H  . lactulose  20 g Oral TID  . sodium chloride flush  3 mL Intravenous Q12H   Continuous Infusions: . sodium chloride       LOS: 0 days   Time spent= 30 mins    Ankit Arsenio Loader, MD [facility] Pager [phone]   If 7PM-7AM, please contact night-coverage [email or web address] Password [facility] 06/30/2018, 12:37 PM

## 2018-06-30 NOTE — Progress Notes (Signed)
Patient had 16 beat run of v-tach. Asymptomatic. Will continue to monitor.

## 2018-06-30 NOTE — Progress Notes (Signed)
Subjective:   Patient feels much better, dyspnea has improved, leg edema is completely resolved.  Feels like he can return home.  Objective:  Vital Signs in the last 24 hours: Temp:  [97.7 F (36.5 C)-98.3 F (36.8 C)] 98.3 F (36.8 C) (10/26 0500) Pulse Rate:  [62-77] 62 (10/26 0500) Resp:  [16-18] 18 (10/26 0500) BP: (113-118)/(65-75) 118/72 (10/26 0500) SpO2:  [96 %-99 %] 97 % (10/26 0500) Weight:  [83.9 kg] 83.9 kg (10/26 0500)  Intake/Output from previous day: 10/25 0701 - 10/26 0700 In: 483 [P.O.:480; I.V.:3] Out: 2850 [Urine:2850]  Physical Exam: Blood pressure 118/72, pulse 62, temperature 98.3 F (36.8 C), temperature source Oral, resp. rate 18, height 6' (1.829 m), weight 83.9 kg, SpO2 97 %.  Physical Exam  Constitutional: He appears well-developed and well-nourished. No distress.  Eyes: Conjunctivae are normal.  Neck: Neck supple. No JVD present.  Cardiovascular: Normal rate, normal heart sounds and intact distal pulses.  Pulmonary/Chest: Effort normal. He has decreased breath sounds in the right lower field. He has no wheezes. He has no rales.  Abdominal: Soft. Bowel sounds are normal.  Musculoskeletal: Normal range of motion. He exhibits no edema.  Skin: Skin is warm and dry.   Lab Results: BMP Recent Labs    06/28/18 1928 06/30/18 0552  NA 142 140  K 3.9 3.8  CL 114* 107  CO2 20* 24  GLUCOSE 106* 100*  BUN 26* 30*  CREATININE 1.95* 2.05*  CALCIUM 9.0 8.9  GFRNONAA 33* 31*  GFRAA 38* 36*    CBC Recent Labs  Lab 06/28/18 1928  WBC 4.3  RBC 3.56*  HGB 10.9*  HCT 34.6*  PLT 157  MCV 97.2  MCH 30.6  MCHC 31.5  RDW 14.6   Cardiac Panel (last 3 results) Recent Labs    06/29/18 0608 06/29/18 1235  TROPONINI 0.06* 0.06*  Imaging: Dg Chest 2 View  Result Date: 06/28/2018 CLINICAL DATA:  Shortness of breath EXAM: CHEST - 2 VIEW COMPARISON:  None. FINDINGS: Cardiomegaly with vascular congestion. Small bilateral pleural effusions and  bibasilar atelectasis. No overt edema or acute bony abnormality. IMPRESSION: Cardiomegaly, vascular congestion. Small effusions with bibasilar atelectasis. Electronically Signed   By: Rolm Baptise M.D.   On: 06/28/2018 19:58   Nm Pulmonary Perf And Vent  Result Date: 06/29/2018 CLINICAL DATA:  Chest pain.  Shortness of breath. EXAM: NUCLEAR MEDICINE VENTILATION - PERFUSION LUNG SCAN TECHNIQUE: Ventilation images were obtained in multiple projections using inhaled aerosol Tc-62m DTPA. Perfusion images were obtained in multiple projections after intravenous injection of Tc-8m-MAA. RADIOPHARMACEUTICALS:  32.0 mCi of Tc-44m DTPA aerosol inhalation and 4.1 mCi Tc50m-MAA IV COMPARISON:  Chest x-ray 06/28/2018. FINDINGS: Bilateral mild ventilatory defects without corresponding perfusion defects are noted. Given chest x-ray findings of CHF these findings are most likely secondary to CHF. No evidence of pulmonary embolus. IMPRESSION: No evidence of pulmonary embolus. Electronically Signed   By: Marcello Moores  Register   On: 06/29/2018 13:11    Cardiac Studies: EKG 06/28/2018: Normal sinus rhythm with rate of 85 bpm with frequent PACs with aberrantly conducted beats, occasional PVCs.  Echocardiogram 04/13/2017: Severe LV systolic dysfunction, EF 25 to 30% with severe diffuse hypokinesis with akinesis of the inferolateral myocardium.  Restrictive physiology.  Moderate to severe left atrial enlargement, moderately dilated right ventricle with moderate pulmonary hypertension.  Telemetry: 2 episodes of NSVT 4 beat and 3 beat,  at 3:50 AM and 4:30 AM on 06/28/2018. occasional PVCs, PACs,  Assessment/Plan:  1.  Acute  systolic and diastolic heart failure. 2.  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  Chronic stage III kidney disease. Suspect new baseline 4.  NSVT during sleep, 4 beat and 3 beat on 06/28/2018.  Recommendation: Patient is now clinically doing well.  He is appearing to be  more well compensated with absence of JVD and also leg edema is completely resolved.  Will change Lasix to p.o., I would like to challenge him with Entresto 24/49 mg p.o. twice daily, recheck BMP in the morning.  He should be able to be discharged home tomorrow morning, I would like to see him back in the office within 1 week of hospital discharge and I will perform outpatient labs.  With regard to NSVT, he has not had any further episodes, this occurred at night, we could consider sleep study.  Continue low-dose beta-blocker for now in view of acute decompensated heart failure, I will increase the dose in the outpatient basis.   Adrian Prows, M.D. 06/30/2018, 12:50 PM [facility], [place] Pager: [phone] Office: [phone] If no answer: [phone]

## 2018-06-30 NOTE — Plan of Care (Signed)
  Problem: Activity: Goal: Risk for activity intolerance will decrease Outcome: Progressing  Problem: Nutrition: Goal: Adequate nutrition will be maintained Outcome: Progressing   Problem: Coping: Goal: Level of anxiety will decrease Outcome: Progressing   Problem: Pain Managment: Goal: General experience of comfort will improve Outcome: Progressing   Problem: Education: Goal: Ability to verbalize understanding of medication therapies will improve Outcome: Progressing

## 2018-07-01 LAB — BASIC METABOLIC PANEL
ANION GAP: 10 (ref 5–15)
BUN: 30 mg/dL — ABNORMAL HIGH (ref 8–23)
CO2: 23 mmol/L (ref 22–32)
Calcium: 9 mg/dL (ref 8.9–10.3)
Chloride: 106 mmol/L (ref 98–111)
Creatinine, Ser: 2.08 mg/dL — ABNORMAL HIGH (ref 0.61–1.24)
GFR, EST AFRICAN AMERICAN: 35 mL/min — AB (ref >60)
GFR, EST NON AFRICAN AMERICAN: 30 mL/min — AB (ref >60)
Glucose, Bld: 106 mg/dL — ABNORMAL HIGH (ref 70–99)
POTASSIUM: 3.7 mmol/L (ref 3.5–5.1)
SODIUM: 139 mmol/L (ref 135–145)

## 2018-07-01 LAB — MAGNESIUM: Magnesium: 2 mg/dL (ref 1.7–2.4)

## 2018-07-01 LAB — GLUCOSE, CAPILLARY: Glucose-Capillary: 105 mg/dL — ABNORMAL HIGH (ref 70–99)

## 2018-07-01 MED ORDER — FUROSEMIDE 40 MG PO TABS: 40.0000 mg | ORAL_TABLET | Freq: Two times a day (BID) | ORAL | 0 refills | Status: DC

## 2018-07-01 MED ORDER — SACUBITRIL-VALSARTAN 24-26 MG PO TABS: 1.0000 | ORAL_TABLET | Freq: Two times a day (BID) | ORAL | 0 refills | Status: AC

## 2018-07-01 MED ORDER — SENNOSIDES-DOCUSATE SODIUM 8.6-50 MG PO TABS: 1.0000 | ORAL_TABLET | Freq: Every evening | ORAL | 0 refills | Status: AC | PRN

## 2018-07-01 MED ORDER — POTASSIUM CHLORIDE CRYS ER 20 MEQ PO TBCR
40.0000 meq | EXTENDED_RELEASE_TABLET | Freq: Once | ORAL | Status: AC
  Administered 2018-07-01: 40 meq via ORAL
  Filled 2018-07-01: qty 2

## 2018-07-01 MED ORDER — CARVEDILOL 12.5 MG PO TABS: 12.5000 mg | ORAL_TABLET | Freq: Two times a day (BID) | ORAL | 0 refills | Status: DC

## 2018-07-01 NOTE — Care Management Note (Signed)
Case Management Note  Patient Details  Name: Mark Baird. MRN: [id number] Date of Birth: February 13, 1946  Subjective/Objective:                    Action/Plan:  Referral made to [facility] for [facility] services. No other CM needs identified.  Expected Discharge Date:  07/01/18               Expected Discharge Plan:  [place]  In-House Referral:     Discharge planning Services  CM Consult  Post Acute Care Choice:    Choice offered to:     DME Arranged:    DME Agency:     HH Arranged:  RN, PT, OT [place] Agency:  [place]  Status of Service:  Completed, signed off  If discussed at [place] of Stay Meetings, dates discussed:    Additional Comments:  Carles Collet, RN 07/01/2018, 12:59 PM

## 2018-07-01 NOTE — Discharge Summary (Signed)
Physician Discharge Summary  Mark Baird. ACZ:[id number] DOB: [birth year] DOA: 06/28/2018  PCP: Lucianne Lei, MD  Admit date: 06/28/2018 Discharge date: 07/01/2018  Admitted From: Home Disposition: Home  Recommendations for Outpatient Follow-up:  1. Follow up with PCP in 1-2 weeks 2. Please obtain BMP/CBC in one week your next doctors visit.  3. Follow-up with Dr. Einar Gip later this week.  He will call to make follow-up appointment 4. 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  Patient stopped taking his medication several months ago.  He was found to be in fluid overload therefore admitted to the hospital for IV diuresis.   Discharge Diagnoses:  Principal Problem:   Acute on chronic combined systolic and diastolic CHF (congestive heart failure) (HCC) Active Problems:   Chronic renal insufficiency   Chest pain   Cardiac arrhythmia   Normocytic anemia   Acute exacerbation of CHF (congestive heart failure) (HCC)  Acute on chronic combined systolic and diastolic congestive heart failure with ejection fraction percent, grade 3 diastolic dysfunction, grade 2 Dyspnea on exertion -Patient is significantly improved with diuresis. - Spoke with Dr. Einar Gip from cardiology this morning who recommended discharging patient on Lasix 40 mg twice daily, Coreg 12.5 mg twice daily, Entresto twice daily.  He will see the patient in the office in about 2-3 days to make  any necessary follow-up changes.  Constipation -Improved  CKD stage III - Baseline creatinine probably 1.8.    Stable around 2.0  Anemia of chronic disease -Hemoglobin is stable at 10.9.  No obvious signs of bleeding noted.  Due to his generalized weakness and medication noncompliance, he would benefit from home health to keep him on track with his cardiac condition.  On subcutaneous heparin while patient was here Full code Spoke with the patient's causing Barnett Applebaum over the phone at his request and updated her about the patient's condition. Discharge the patient home today with home health-PT/OT/RN  Discharge Instructions   Allergies as of 07/01/2018      Reactions   Molds & Smuts Other (See Comments)   Due to allergy testing.   Penicillins Swelling   Has patient had a PCN reaction causing immediate rash, facial/tongue/throat swelling, SOB or lightheadedness with hypotension: Yes Has patient had a PCN reaction causing severe rash involving mucus membranes or skin necrosis: No Has patient had a PCN reaction that required hospitalization:No Has patient had a PCN reaction occurring within the last 10 years: No If all of the above answers are "NO", then may proceed with Cephalosporin use.      Medication List    STOP taking these medications   ENTRESTO 97-103 MG Generic drug:  sacubitril-valsartan Replaced by:  sacubitril-valsartan 24-26 MG   spironolactone 25 MG tablet Commonly known as:  ALDACTONE   torsemide 20 MG tablet Commonly known as:  DEMADEX     TAKE these medications   carvedilol 12.5 MG tablet Commonly known as:  COREG Take 1 tablet (12.5 mg total) by mouth 2 (two) times daily with a meal.  furosemide 40 MG tablet Commonly known as:  LASIX Take 1 tablet (40 mg total) by mouth 2 (two) times daily.   sacubitril-valsartan 24-26 MG Commonly known as:  ENTRESTO Take 1 tablet by mouth 2 (two) times daily. Replaces:  ENTRESTO 97-103 MG   senna-docusate  8.6-50 MG tablet Commonly known as:  Senokot-S Take 1 tablet by mouth at bedtime as needed for mild constipation or moderate constipation.      Follow-up Information    Lucianne Lei, MD. Schedule an appointment as soon as possible for a visit in 1 week(s).   Specialty:  Family Medicine Contact information: [address] [place] [address] [phone]        Adrian Prows, MD Follow up.   Specialty:  Cardiology Why:  Will call with an appoitment.  Contact information: [address] [place] [address] [phone]          Allergies  Allergen Reactions  . Molds & Smuts Other (See Comments)    Due to allergy testing.  Marland Kitchen Penicillins Swelling    Has patient had a PCN reaction causing immediate rash, facial/tongue/throat swelling, SOB or lightheadedness with hypotension: Yes Has patient had a PCN reaction causing severe rash involving mucus membranes or skin necrosis: No Has patient had a PCN reaction that required hospitalization:No Has patient had a PCN reaction occurring within the last 10 years: No If all of the above answers are "NO", then may proceed with Cephalosporin use.     You were cared for by a hospitalist during your hospital stay. If you have any questions about your discharge medications or the care you received while you were in the hospital after you are discharged, you can call the unit and asked to speak with the hospitalist on call if the hospitalist that took care of you is not available. Once you are discharged, your primary care physician will handle any further medical issues. Please note that no refills for any discharge medications will be authorized once you are discharged, as it is imperative that you return to your primary care physician (or establish a relationship with a primary care physician if you do not have one) for your aftercare needs so that they can reassess your need for medications and monitor your lab  values.  Consultations:  Cardiology, Dr. Einar Gip   Procedures/Studies: Dg Chest 2 View  Result Date: 06/28/2018 CLINICAL DATA:  Shortness of breath EXAM: CHEST - 2 VIEW COMPARISON:  None. FINDINGS: Cardiomegaly with vascular congestion. Small bilateral pleural effusions and bibasilar atelectasis. No overt edema or acute bony abnormality. IMPRESSION: Cardiomegaly, vascular congestion. Small effusions with bibasilar atelectasis. Electronically Signed   By: Rolm Baptise M.D.   On: 06/28/2018 19:58   Nm Pulmonary Perf And Vent  Result Date: 06/29/2018 CLINICAL DATA:  Chest pain.  Shortness of breath. EXAM: NUCLEAR MEDICINE VENTILATION - PERFUSION LUNG SCAN TECHNIQUE: Ventilation images were obtained in multiple projections using inhaled aerosol Tc-67m DTPA. Perfusion images were obtained in multiple projections after intravenous injection of Tc-44m-MAA. RADIOPHARMACEUTICALS:  32.0 mCi of Tc-51m DTPA aerosol inhalation and 4.1 mCi Tc73m-MAA IV COMPARISON:  Chest x-ray 06/28/2018. FINDINGS: Bilateral mild ventilatory defects without corresponding perfusion defects are noted. Given chest x-ray findings of CHF these findings are most likely secondary to CHF. No evidence of pulmonary embolus. IMPRESSION: No evidence of pulmonary embolus. Electronically Signed   By: [place]   On: 06/29/2018 13:11      Subjective: Feels better this morning, no  complaints.  General = no fevers, chills, dizziness, malaise, fatigue HEENT/EYES = negative for pain, redness, loss of vision, double vision, blurred vision, loss of hearing, sore throat, hoarseness, dysphagia Cardiovascular= negative for chest pain, palpitation, murmurs, lower extremity swelling Respiratory/lungs= negative for shortness of breath, cough, hemoptysis, wheezing, mucus production Gastrointestinal= negative for nausea, vomiting,, abdominal pain, melena, hematemesis Genitourinary= negative for Dysuria, Hematuria, Change in Urinary  Frequency MSK = Negative for arthralgia, myalgias, Back Pain, Joint swelling  Neurology= Negative for headache, seizures, numbness, tingling  Psychiatry= Negative for anxiety, depression, suicidal and homocidal ideation Allergy/Immunology= Medication/Food allergy as listed  Skin= Negative for Rash, lesions, ulcers, itching   Discharge Exam: Vitals:   07/01/18 0037 07/01/18 0444  BP: (!) 98/56 103/62  Pulse: 76 69  Resp: 20 20  Temp: 98.7 F (37.1 C) 98.6 F (37 C)  SpO2: 100% 100%   Vitals:   06/30/18 2126 07/01/18 0037 07/01/18 0439 07/01/18 0444  BP: 122/60 (!) 98/56  103/62  Pulse:  76  69  Resp:  20  20  Temp:  98.7 F (37.1 C)  98.6 F (37 C)  TempSrc:  Oral  Oral  SpO2:  100%  100%  Weight:   82.8 kg   Height:        General: Pt is alert, awake, not in acute distress Cardiovascular: RRR, S1/S2 +, no rubs, no gallops Respiratory: CTA bilaterally, no wheezing, no rhonchi Abdominal: Soft, NT, ND, bowel sounds + Extremities: no edema, no cyanosis    The results of significant diagnostics from this hospitalization (including imaging, microbiology, ancillary and laboratory) are listed below for reference.     Microbiology: No results found for this or any previous visit (from the past 240 hour(s)).   Labs: BNP (last 3 results) Recent Labs    06/29/18 0201 06/30/18 0552  BNP 3,445.4* 5,176.1*   Basic Metabolic Panel: Recent Labs  Lab 06/28/18 1928 06/30/18 0552 07/01/18 0241  NA 142 140 139  K 3.9 3.8 3.7  CL 114* 107 106  CO2 20* 24 23  GLUCOSE 106* 100* 106*  BUN 26* 30* 30*  CREATININE 1.95* 2.05* 2.08*  CALCIUM 9.0 8.9 9.0  MG  --  1.9 2.0   Liver Function Tests: No results for input(s): AST, ALT, ALKPHOS, BILITOT, PROT, ALBUMIN in the last 168 hours. No results for input(s): LIPASE, AMYLASE in the last 168 hours. No results for input(s): AMMONIA in the last 168 hours. CBC: Recent Labs  Lab 06/28/18 1928  WBC 4.3  HGB 10.9*  HCT  34.6*  MCV 97.2  PLT 157   Cardiac Enzymes: Recent Labs  Lab 06/29/18 0608 06/29/18 1235  TROPONINI 0.06* 0.06*   BNP: Invalid input(s): POCBNP CBG: Recent Labs  Lab 06/30/18 0809 06/30/18 1214 06/30/18 1639 06/30/18 2129 07/01/18 0733  GLUCAP 105* 116* 109* 152* 105*   D-Dimer Recent Labs    06/29/18 0201  DDIMER 0.72*   Hgb A1c No results for input(s): HGBA1C in the last 72 hours. Lipid Profile No results for input(s): CHOL, HDL, LDLCALC, TRIG, CHOLHDL, LDLDIRECT in the last 72 hours. Thyroid function studies No results for input(s): TSH, T4TOTAL, T3FREE, THYROIDAB in the last 72 hours.  Invalid input(s): FREET3 Anemia work up No results for input(s): VITAMINB12, FOLATE, FERRITIN, TIBC, IRON, RETICCTPCT in the last 72 hours. Urinalysis No results found for: COLORURINE, APPEARANCEUR, LABSPEC, [place], GLUCOSEU, HGBUR, BILIRUBINUR, KETONESUR, PROTEINUR, UROBILINOGEN, NITRITE, LEUKOCYTESUR Sepsis Labs Invalid input(s): PROCALCITONIN,  WBC,  LACTICIDVEN Microbiology No results found  for this or any previous visit (from the past 240 hour(s)).   Time coordinating discharge:  I have spent 35 minutes face to face with the patient and on the ward discussing the patients care, assessment, plan and disposition with other care givers. >50% of the time was devoted counseling the patient about the risks and benefits of treatment/Discharge disposition and coordinating care.   SIGNED:   Damita Lack, MD  [facility] 07/01/2018, 10:32 AM Pager   If 7PM-7AM, please contact night-coverage [email or web address] Password [email or web address]

## 2018-07-01 NOTE — Progress Notes (Signed)
Pt taken off tele and removed IV. Pt  awaiting ride for discharge.

## 2018-07-01 NOTE — Progress Notes (Signed)
D/c teaching complete

## 2018-07-04 DIAGNOSIS — Z6827 Body mass index (BMI) 27.0-27.9, adult: Secondary | ICD-10-CM | POA: Diagnosis not present

## 2018-07-04 DIAGNOSIS — I5040 Unspecified combined systolic (congestive) and diastolic (congestive) heart failure: Secondary | ICD-10-CM | POA: Diagnosis not present

## 2018-07-04 DIAGNOSIS — I5021 Acute systolic (congestive) heart failure: Secondary | ICD-10-CM | POA: Diagnosis not present

## 2018-07-05 ENCOUNTER — Other Ambulatory Visit: Payer: Self-pay

## 2018-07-05 NOTE — Patient Outreach (Signed)
Patient triggered Red on Emmi Heart Failure Dashboard, notification sent to:  Davina Green, RN 

## 2018-07-05 NOTE — Patient Outreach (Signed)
[facility]) Care Management  07/05/2018  Mark Baird. 1946-02-17 [id number]   EMMI: congestive heart failure red alert Referral date: 07/05/18 Referral reason: weighed themselves Insurance:  Health team advantage Day # 2  Telephone call to patient regarding EMMI heart failure red alert.  Unable to reach patient. HIPAA compliant voice message left with call back phone number   PLAN: RNCM will attempt 2nd telephone call to patient within 4 business days.  RNCM will send outreach letter to patient.   [name] RN,BSN,CCM [facility] Telephonic  [phone]

## 2018-07-09 ENCOUNTER — Other Ambulatory Visit: Payer: Self-pay

## 2018-07-09 NOTE — Patient Outreach (Signed)
[facility]) Care Management  07/09/2018  Laderrick Wilk. Dec 11, 1945 [id number]  EMMI: congestive heart failure red alert Referral date: 07/05/18,  07/09/18 Referral reason: weighed themselves Insurance:  Health team advantage Day # 2 and Day #4 Attempt #2  Telephone call to patient regarding EMMI heart failure red alert. Unable to reach patient. HIPAA compliant voice message left with call back phone number.   PLAN: RNCM will attempt 3rd telephone outreach to patient within 4 business days   [name] RN,BSN,CCM [facility] Telephonic  [phone]

## 2018-07-12 ENCOUNTER — Other Ambulatory Visit: Payer: Self-pay

## 2018-07-12 ENCOUNTER — Ambulatory Visit: Payer: Self-pay

## 2018-07-12 NOTE — Patient Outreach (Addendum)
[name]) Care Management  07/12/2018  Mark Baird. [birth year] [id number]  EMMI:congestive heart failure red alert Referral date:07/05/18,  07/09/18 Referral reason:weighed themselves: yes,  New or worsening problem: yes Insurance: Health team advantage Day #2 and Day #4, Day #9 Attempt #2  Telephone call to patient regarding EMMI heart failure red alert. HIPAA verified with patient. Explained reason for call. Patient states he has had right foot pain for several days. Patient states he has spoken with his primary care provider office and they requested he come in. Patient states he is having difficulty finding transportation to take him to the doctor. atient reports he had to cancel and reschedule follow up appointment with his cardiologist due to lack of transportation.    Patient denies swelling in his foot stating, " its sore and has some pain."  Patient states he is unable to walk on the foot very well.  Patient states he has had a call from home health agency but he did not accept the services. Patient states he was concerned they may be running a scam so he did not accept services. RNCM explained to patient purpose of home health services.  Advised patient that the doctor from the hospital ordered his home care services.    Patient states he has his medications and is taking them. Patient states his primary doctor and cardiologist called in a prescription for 2 medications.  Patient states he has not been able to get to the [facility] to pick up his medicine. Patient states he is also getting low on a few of his other medications.  Patient states he is independent in is daily care. Patient reports he was driving prior to recent admission. Patient states he has been diagnosed with heart trouble. Patient states he weighs himself but does not record. Patient states he was given paperwork on the diet he should have. Patient states he is not very familiar with the  diet.  RNCM discussed and offered [facility] care management services. Patient verbally agreed.  RNCM called [facility] and spoke with Mark Baird states patient has been closed with home health due to refusal of services. Mark Baird states a new order for home services would bee needed.  RNCm contacted patients primary MD office and spoke with Mark Baird.  Explained to Mark Baird that patient discontinued his home health services due to his concern that it was a "scam" RNCM requested new  Home health orders for patient  be faxed to [facility]  for services to resume.  Mark Baird verbalized understanding and agreement.   ASSESSMENT:  Patient reports new onset of " heart problems"  Per discharge summary patient was in the hospital due to congestive heart failure.  Patient needs education and management related to congestive heart failure.    PLAN; RNCM will refer patient to community case manager and social worker  Mark Plowman RN,BSN,CCM [facility] Telephonic  [phone]

## 2018-07-16 ENCOUNTER — Other Ambulatory Visit: Payer: Self-pay

## 2018-07-16 NOTE — Patient Outreach (Signed)
[facility]) Care Management  07/16/2018  Mark Baird. August 29, 1946 [id number]   EMMI:congestive heart failure red alert Referral date:07/05/18, 07/09/18 Referral reason:weighed themselves: NO Insurance: Health team advantage Day #10  Telephone call to patient regarding EMMI heart failure red alert. HIPAA verified with patient. Explained reason for call. Patient states he is weighing daily and recording his weights. Patient states his weight this morning is 183 lbs. Reports yesterday's weight at 181 lbs. Patient denies any shortness of breath or swelling. Patient states he did not eat anything high in salt that he knows of on yesterday. Patient states he continues to take his medications as prescribed.  Patient states he is scheduled to see his primary MD on tomorrow.   RNCM advised notify his doctor for increase weight gain of 3 lbs overnight or 5 lbs in a week and or for increase swelling and shortness of breath.  RNCM advised patient to call 911 for more severe symptoms. Patient verbalized understanding.  Patient verbally agreed to ongoing EMMI heart failure automated calls.  RNCM reminded patient that a community case manager from [facility] care network will be calling him for follow up within this week. Patient verbalized understanding.   PLAN: No further follow up needed by this RNCM at this time.  RNCM community case manager to follow patient.   [name] RN,BSN,CCM [facility] Telephonic  [phone]

## 2018-07-17 DIAGNOSIS — M189 Osteoarthritis of first carpometacarpal joint, unspecified: Secondary | ICD-10-CM | POA: Diagnosis not present

## 2018-07-17 DIAGNOSIS — I5040 Unspecified combined systolic (congestive) and diastolic (congestive) heart failure: Secondary | ICD-10-CM | POA: Diagnosis not present

## 2018-07-17 DIAGNOSIS — Z6826 Body mass index (BMI) 26.0-26.9, adult: Secondary | ICD-10-CM | POA: Diagnosis not present

## 2018-07-17 DIAGNOSIS — N189 Chronic kidney disease, unspecified: Secondary | ICD-10-CM | POA: Diagnosis not present

## 2018-07-18 ENCOUNTER — Other Ambulatory Visit: Payer: Self-pay

## 2018-07-18 NOTE — Patient Outreach (Addendum)
[facility]) Care Management  07/18/2018  Shadee Montoya 1945/10/17 [id number]   RNCM received referral 07/13/18 from [name], telephonic RNCM, for heart failure education and management. Per notification from Mrs. Green- Client was called on 07/16/18 re: EMMI red weight increase; primary care appointment on 07/17/18. RNCM received another notification on 07/18/18 re: EMMI red flag regarding weight increase to 186 pounds from 183 on 07/16/18.  72 year old with recent admission 10/24-10/27 for acute on chronic heart failure. History of CRI, chest pain, cardiac arrhythmias. Per chart cardiologist is Dr. Einar Gip.  RNCM called to follow up. No answer. HIPPA compliant message left.  Plan: send outreach letter and continue to follow.  Addendum: RNCM will send unsuccessful outreach letter and call in 3-4 business days if no return call.  Thea Silversmith, RN, MSN, [place] Coordinator Cell: [phone]

## 2018-07-19 ENCOUNTER — Other Ambulatory Visit: Payer: Self-pay

## 2018-07-19 NOTE — Patient Outreach (Addendum)
[facility]) Care Management  07/19/2018  Mark Baird. April 27, 1946 [id number]  72 year old with recent admission 10/24-10/27 due to heart failure. History of heart failure, chronic renal insufficiency, cardiac arrythmia, chest pain. Primary care listed as completing transition of care.  RNCM received referral 07/13/18 from [name], telephonic RNCM, for heart failure education and management. Per notification from [name]- Client was called on 07/16/18 re: EMMI red weight increase; primary care appointment on 07/17/18. RNCM received another EMMI red flag notification on 07/18/18 re: weight increase to 186 pounds from 183 on 07/16/18.  RNCM called to follow up. EMMI red flag received today re: weight increased to 187 pounds. Client reports he is feeling better, denies shortness of breath, denies edema or increase swelling. Reports his weight today was 186 and weight yesterday was 186, However weight increase noted: 11/1 181pounds; 11/11 183 pounds; weight 11/12 186. Client states today weight is 186 pound (recorded weight per EMMI today is 187pounds).  RNCM called Dr. Irven Shelling office request a sooner appointment. Client will be seen on tomorrow 11/15 at 12N. Client is agreeable and thanked RNCM for calling to get this appointment.  Plan: home visit scheduled.   THN CM Care Plan Problem One     Most Recent Value  Care Plan Problem One  at risk for readmission as evidence by recent admission.  Role Documenting the Problem One  Care Management [place] for Problem One  Active  [facility] Long Term Goal   client will not be readmitted within the next 31 days.  THN Long Term Goal Start Date  07/19/18  Interventions for Problem One Long Term Goal  client called and discussed his overall sense of well-being, cardiologist to get a sooner appointment.  THN CM Short Term Goal #1   client will verbalize attending scheduled appointments within the next 30 days.  THN CM  Short Term Goal #1 Start Date  07/19/18  Interventions for Short Term Goal #1  RNCM assisted client with calling to get a sooner appointment.  THN CM Short Term Goal #2   client will verbalize contact with RNCM, 24 hour nurse advice line, providers as needed within the next 30 days.  THN CM Short Term Goal #2 Start Date  07/19/18  Interventions for Short Term Goal #2  RNCM provided contact numbers to [facility] and nurse advice line and encouraged to call as needed.     Thea Silversmith, RN, MSN, [place] Coordinator Cell: [phone]  Addendum -07/20/18 0836: Client's recorded weight per EMMI notification, on 07/19/18 was 186 pounds and on 07/18/18 was 187. Client reported his weight was 186 pounds both days.

## 2018-07-20 ENCOUNTER — Other Ambulatory Visit: Payer: Self-pay

## 2018-07-20 DIAGNOSIS — D649 Anemia, unspecified: Secondary | ICD-10-CM | POA: Diagnosis not present

## 2018-07-20 DIAGNOSIS — N183 Chronic kidney disease, stage 3 (moderate): Secondary | ICD-10-CM | POA: Diagnosis not present

## 2018-07-20 DIAGNOSIS — I428 Other cardiomyopathies: Secondary | ICD-10-CM | POA: Diagnosis not present

## 2018-07-20 DIAGNOSIS — Z0189 Encounter for other specified special examinations: Secondary | ICD-10-CM | POA: Diagnosis not present

## 2018-07-20 NOTE — Patient Outreach (Signed)
[facility]) Care Management  07/20/2018  Mark Baird. 12/02/1945 [id number]   RNCM received EMMI red notification-client's weight 186 on 07/19/18. RNCM spoke with client on 07/19/18. Per client his weight has stayed the same 186 for past two days. He denied SOB or edema. Client has an appointment with cardiologist today. Therefore RNCM will not call regarding this notification.  Thea Silversmith, RN, MSN, [place] Coordinator Cell: [phone]

## 2018-07-23 ENCOUNTER — Other Ambulatory Visit: Payer: Self-pay

## 2018-07-23 DIAGNOSIS — K219 Gastro-esophageal reflux disease without esophagitis: Secondary | ICD-10-CM | POA: Diagnosis not present

## 2018-07-23 DIAGNOSIS — R1310 Dysphagia, unspecified: Secondary | ICD-10-CM | POA: Diagnosis not present

## 2018-07-23 DIAGNOSIS — K222 Esophageal obstruction: Secondary | ICD-10-CM | POA: Diagnosis not present

## 2018-07-23 NOTE — Patient Outreach (Signed)
[place] [facility]) Care Management  07/23/2018  Avyon Herendeen. [birth year] [id number]  RNCM received notification re: EMMI call 11/17 - that client expressed having little interest in doing things that he used to enjoy. Per EMMI weight down 3 pounds to 185 pounds.    RNCM called to follow up. Client reports he still has interest in doing things. It is just that he is not able to do it now. He states it is hard to find that kind of work in this area. Mr. 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 He states he is not able to do this anymore and he is more disappointed. RNCM discussed possibility social work referral for counseling and/or finding resources that can get him out of the house. Will discuss further at home visit tomorrow.  Client reports he saw cardiology on Friday. He reports he brought his medication and cardiologist went over his medications with him.  Plan: education reinforcement of heart failure management. attend home visit to complete assessment.  Thea Silversmith, RN, MSN, [place] Coordinator Cell: [phone]

## 2018-07-24 ENCOUNTER — Other Ambulatory Visit: Payer: Self-pay
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
## 2018-07-25 ENCOUNTER — Other Ambulatory Visit: Payer: Self-pay

## 2018-07-25 ENCOUNTER — Other Ambulatory Visit: Payer: Self-pay | Admitting: Licensed Clinical Social Worker

## 2018-07-25 DIAGNOSIS — D649 Anemia, unspecified: Secondary | ICD-10-CM | POA: Diagnosis not present

## 2018-07-25 DIAGNOSIS — I428 Other cardiomyopathies: Secondary | ICD-10-CM | POA: Diagnosis not present

## 2018-07-25 NOTE — Patient Outreach (Signed)
[facility]) Care Management  07/25/2018  Harlen Danford. 01/22/1946 [id number]  Request received from [place], Eula Fried, to mail provided list of mental health and senior resources.  Mailed today.  Ronn Melena, BSW Social Worker [phone]

## 2018-07-25 NOTE — Patient Outreach (Signed)
Mark [facility]) Care Management  07/25/2018  [name] [birth year] [id number]  [facility] CSW received new referral today on patient that states the following: Client states he has some depression because he is not able to do what his used to do; not able to work anymore. He reports he used to play tennis and loves to fish and travel. Client reports a previous social and active life. Client is agreeable to Social work referral for depression resources and possible resources for socialization. [name] completed outreach call to patient and was able to successfully reach him and receive HIPPA verifications. [name] introduced self, reason for call and of THN social work services. Patient admits having experienced recent depressive symptoms. Patient reports that he does not know of any local mental health resources. [name] spent time educating patient on available mental health support resources within the area such as: individual therapy, group therapy, support groups, psychiatrist, mental health association of [place] and their various programs. Patient unsure if he wishes to pursue these resources but is agreeable to [place] mailing him this list of information for him to keep. Patient reports not being on any current medication to treat his depression. He was advised to talk to his PCP about his recent depression and the possibility of getting on a medication to treat symptoms. Patient reports being given "a lot to think about." Patient was provided education on available socialization opportunities within the area as well. Patient is very familiar with the [facility] and already is on the mailing list and receives a monthly calendar of their activities. [name] encouraged patient to consider going back to center in order to gain socialization which could help combat his depression. Patient agreeable to consider this. Patient denies needing [name] to mail him information on the  [facility] but [place] will mail out list of other senior centers as well. Patient denies needing a home visit and reports that mailing resources will suffice. [name] will mail requested community resources and will follow up within two weeks to make sure information was successfully received in the mail and all questions have been answered. [name] will not open program at this time.   Eula Fried, BSW, MSW, [email or web address] Phone: [phone] Fax: [phone]

## 2018-07-25 NOTE — Patient Outreach (Signed)
[place] [facility]) Care Management  07/25/2018  Mark Baird. 1946-07-18 [id number]   RNCM received notification of EMMI red flag from call placed on 07/24/18 re: new and worsening problem. RNCM completed home visit after this call on yesterday. Client with no signs/symptoms of exacerbation. Reports he is being followed more closely for his kidney functioning. See home visit note from 07/24/18, therefore, RNCM will not call client today.  Plan: continue to follow.  Thea Silversmith, RN, MSN, [place] Coordinator Cell: [phone]

## 2018-07-25 NOTE — Patient Outreach (Signed)
[place] [facility]) Care Management  07/25/2018  Byrant Valent. 04-15-1946 [id number]  Request received from [place], Eula Fried, to mail provided list of mental health resources and resources for seniors.  Mailed today.  Ronn Melena, BSW Social Worker [phone]

## 2018-07-30 ENCOUNTER — Other Ambulatory Visit: Payer: Self-pay

## 2018-07-30 NOTE — Patient Outreach (Signed)
[place] [facility]) Care Management  07/30/2018  Mark Baird. 31-Jul-1946 [id number]    Care Coordination: RNCM called  primary care office and spoke with Dr. Fransico Setters nurse regarding client reports of signs/symptoms depression. Also reinforced RNCM's note sent to provider last week. She reports she saw RNCM's note last week. Client with scheduled appointment on Wednesday.  Plan: continue to follow.  Thea Silversmith, RN, MSN, [place] Coordinator Cell: [phone]

## 2018-07-30 NOTE — Patient Outreach (Signed)
[place] [facility]) Care Management  07/30/2018  Mark Baird. [birth year] [id number]  72 year old with history of Heart failure, CKD, anemia, cardiac arrhythmias, chest pain. Referral received from telephonic care coordinator regarding education heart failure. Recent hospitalization 10/24-10/27 with acute on chronic heart failure.  RNCM received EMMI red notification: "Lost interest in the things they used to do". Per EMMI report, weight down one pound to 178 pounds. RNCM called to follow up. No answer. HIPPA compliant message.  Client expressed this with RNCM at home visit last week. Social work consult completed regarding this last week. Client has appointments this week with primary care this week.  Plan: await return call. update primary care. Client will continue to be followed by [place] Coordinator. RNCM will update next assigned Care Coordinator.  Thea Silversmith, RN, MSN, [place] Coordinator Cell: [phone]

## 2018-07-31 ENCOUNTER — Other Ambulatory Visit: Payer: Self-pay

## 2018-07-31 DIAGNOSIS — I10 Essential (primary) hypertension: Secondary | ICD-10-CM | POA: Diagnosis not present

## 2018-07-31 DIAGNOSIS — Z0000 Encounter for general adult medical examination without abnormal findings: Secondary | ICD-10-CM | POA: Diagnosis not present

## 2018-07-31 DIAGNOSIS — I272 Pulmonary hypertension, unspecified: Secondary | ICD-10-CM | POA: Diagnosis not present

## 2018-07-31 DIAGNOSIS — I5040 Unspecified combined systolic (congestive) and diastolic (congestive) heart failure: Secondary | ICD-10-CM | POA: Diagnosis not present

## 2018-07-31 NOTE — Patient Outreach (Signed)
[facility]) Care Management  07/31/2018  Mark Baird. 30-Nov-1945 [id number]   Subjective: "I feel fine"  RNCM received notification EMMI red flag client's weight was 180 pounds on 07/30/18, weight 07/29/18 was 178 pounds, client reports weiight today is 182 pounds, however, weight had not increased greater than three pounds in the last 7 days. Client denies any shortness of breath or edema. Client reports he drank a rootbeer and ate fish late last night.  RNCM discussed transition to a different care coordinator. Client verbalized understanding, However client encouraged to call RNCM and/or 24 hour nurse advice line as needed.  Plan: transition to different care coordinator/follow-up next month.  Thea Silversmith, RN, MSN, [place] Coordinator Cell: [phone]

## 2018-08-01 ENCOUNTER — Other Ambulatory Visit: Payer: Self-pay

## 2018-08-01 NOTE — Patient Outreach (Signed)
[place] [facility]) Care Management  08/01/2018  Mark Baird. [birth year] [id number]   RNCM received notification re: EMMI red flag: client's weight 182 pounds on 07/31/18. RNCM spoke with client on yesterday this weight was addressed on yesterday's call. RNCM will not call today.  Plan: Telephonic follow up next month.  Thea Silversmith, RN, MSN, [place] Coordinator Cell: [phone]

## 2018-08-06 ENCOUNTER — Other Ambulatory Visit: Payer: Self-pay

## 2018-08-06 NOTE — Patient Outreach (Signed)
[place] [facility]) Care Management  08/06/2018  Mark Baird. 12/02/1945 [id number]   RNCM received notification regarding EMMI red flag re: "sad, hopeless or empty; lost of interest in things they used to enjoy?".  RNCM has previously discussed with client. Primary care office notified on 07/30/18 of client voicing some symptoms of depression and requested to follow up with client at his upcoming visit. Client has discussed with [name] work on 07/25/18. Client had an appointment with primary care scheduled on 08/01/18 and was encouraged to discuss with primary care at that visit.  RNCM called to discuss EMMI red flag. No answer. HIPPA complaint message left.  Plan: client will continue to be followed.  Thea Silversmith, RN, MSN, [place] Coordinator Cell: [phone]

## 2018-08-09 ENCOUNTER — Other Ambulatory Visit: Payer: Self-pay

## 2018-08-09 NOTE — Patient Outreach (Signed)
[place] [facility] [facility]) Care Management  08/09/2018  Mark Baird. Sep 13, 1945 [id number]   Subjective: "I am in the green zone".  Assessment: 72 year old with history of Heart failure, CKD, anemia, cardiac arrhythmias, chest pain. Referral received from telephonic care coordinator regarding education heart failure. Recent hospitalization 10/24-10/27 with acute on chronic heart failure. Frequent EMMI red flags.  Client reports seeing primary care last month and they discussed his report of depression. Client states he plans to use his silver sneakers benefit and join a gym/exercise more.  Client reports his weight is maintained. He states his weight today is 182 pounds and on yesterday he reports it was "181 or 182 pounds on yesterday".   Client reports he is running out of the Medication Corlanor. He states the medication cost for him is $500 and states he is unable to afford this. He reports he has received previous samples from his cardiologist office.   Plan: send pharmacy referral for assistance. Community care coordinator to continue to follow.  THN CM Care Plan Problem One     Most Recent Value  Care Plan Problem One  at risk for readmission as evidence by recent admission.  Role Documenting the Problem One  Care Management [place] for Problem One  Active  [facility] Long Term Goal   client will not be readmitted within the next 31 days.  THN Long Term Goal Start Date  07/19/18  Interventions for Problem One Long Term Goal  RNCM reiforced importance of weights, discussed upcoming appointments.provided positive feedback regarding clients decision to increase activity and socialization by joining a [employer]/gym.  THN CM Short Term Goal #1   client will verbalize attending scheduled appointments within the next 30 days.  THN CM Short Term Goal #1 Start Date  07/19/18  THN CM Short Term Goal #1 Met Date  08/09/18  THN CM Short Term Goal #2   client will verbalize contact  with RNCM, 24 hour nurse advice line, providers as needed within the next 30 days.  THN CM Short Term Goal #2 Start Date  07/19/18  Interventions for Short Term Goal #2  encouraged RNCM encouraged client to call as needed.    THN CM Care Plan Problem Two     Most Recent Value  Care Plan Problem Two  knowledge defict regarding heart failure management.  Role Documenting the Problem Two  Care Management [place] for Problem Two  Active  Interventions for Problem Two Long Term Goal   discussed with client his overall sense of well being  [facility] Long Term Goal  client will express at least three strategies for heart failure management within the next 45-90 days.  THN Long Term Goal Start Date  07/24/18  THN CM Short Term Goal #1   client will weight and record weights daily within the next 30 days.  THN CM Short Term Goal #1 Start Date  07/24/18  Interventions for Short Term Goal #2   RNCM reinforced the importance of recording daily weights.  THN CM Short Term Goal #2   client will verbalize signs/symptoms of green zone and actions associated within the next 30 days.  THN CM Short Term Goal #2 Start Date  07/24/18  [facility] CM Short Term Goal #2 Met Date  08/09/18     Thea Silversmith, RN, MSN, [place] Coordinator Cell: [phone]

## 2018-08-10 ENCOUNTER — Other Ambulatory Visit: Payer: Self-pay

## 2018-08-10 NOTE — Patient Outreach (Signed)
[place] [facility]) Care Management  08/10/2018  Mark Baird. 04-08-1946 [id number]   RNCM received EMMI red flag today from call placed on 08/09/18 re: did not weigh self on 08/19/18. RNCM spoke with client on yesterday. Client denies any signs/symptoms of exacerbation. Weights address on that call, therefore, RNCM will not call today.   Plan: telephonic follow up call next month.  Thea Silversmith, RN, MSN, [place] Coordinator Cell: [phone]

## 2018-08-13 ENCOUNTER — Other Ambulatory Visit: Payer: Self-pay

## 2018-08-13 DIAGNOSIS — I428 Other cardiomyopathies: Secondary | ICD-10-CM | POA: Diagnosis not present

## 2018-08-13 DIAGNOSIS — N184 Chronic kidney disease, stage 4 (severe): Secondary | ICD-10-CM | POA: Diagnosis not present

## 2018-08-13 DIAGNOSIS — Z0189 Encounter for other specified special examinations: Secondary | ICD-10-CM | POA: Diagnosis not present

## 2018-08-13 DIAGNOSIS — D649 Anemia, unspecified: Secondary | ICD-10-CM | POA: Diagnosis not present

## 2018-08-13 NOTE — Patient Outreach (Signed)
[place] [facility]) Care Management  08/13/2018  Mark Baird. 1946/08/24 [id number]   72 year old with history of Heart failure, CKD, anemia, cardiac arrhythmias, chest pain. Recent hospitalization 10/24-10/27 with acute on chronic heart failure.  RNCM received notification regarding Emmi Red flag: weight 183 pounds on 08/10/18. Previous weight recorded per Emmi was 178 pounds on 08/07/18. RNCM called to follow up. No answer. HIPPA compliant message left.  Plan: await return call and follow up within 3-4 business days if no return call.  Thea Silversmith, RN, MSN, [place] Coordinator Cell: [phone]

## 2018-08-14 ENCOUNTER — Ambulatory Visit: Payer: Self-pay | Admitting: Pharmacist

## 2018-08-14 ENCOUNTER — Other Ambulatory Visit: Payer: Self-pay | Admitting: Pharmacist

## 2018-08-14 NOTE — Patient Outreach (Signed)
[place] [facility]) Care Management  [place]  08/14/2018  Mark Baird. 28-Apr-1946 [id number]   Reason for referral: medication assistance  Unsuccessful telephone call attempt #1 to patient.   HIPAA compliant voicemail left requesting a return call  Plan:  I will make another outreach attempt to patient within 3-4 business days  [name], PharmD, [place]  [phone]

## 2018-08-15 ENCOUNTER — Other Ambulatory Visit: Payer: Self-pay | Admitting: Pharmacist

## 2018-08-15 ENCOUNTER — Other Ambulatory Visit: Payer: Self-pay

## 2018-08-15 ENCOUNTER — Other Ambulatory Visit: Payer: Self-pay | Admitting: Licensed Clinical Social Worker

## 2018-08-15 NOTE — Patient Outreach (Signed)
[place] [facility]) Care Management  08/15/2018  Zakhari Fogel. Apr 28, 1946 [id number]   Subjective: "I am fine"  72 year old with history of Heart failure, CKD, anemia, cardiac arrhythmias, chest pain. Most recent hospitalization 10/24-10/27 with acute on chronic heart failure.  RNCM received notification regarding Emmi Red flag: weight 183 pounds on 08/14/18. Per client, weight today up to 185 pounds; 08/14/18  weight 183 pounds; 08/13/18 weight 180 pounds; 08/12/18 weight 179 pounds; 08/11/18 weight 179 pounds. Upon further questioning, client reports he was instructed per Dr. Shanon Brow, cardiologist to stop taking Entresto; Spironolactone and Corlanor until seen by the "kidney doctor". Client reports he continues to take Metoprolol; Pravastatin; Furosemide 40 mg once a day.   Client reports he does not have an appointment with nephrologist yet. RNCM called nephology office to assist in facilitating the appointment. Voice message left on voicemail Hinton Dyer, [place]).  RNCM instructed client to call cardiologist and report his weight increase. Client denies shortness of breath, denies any edema. Client instructed to call RNCM as needed.  Next appointment with Dr. Earnie Larsson January 20th, 2020.  Plan: RNCM called and updated [facility] Pharmacist, Lottie Dawson, await return call from nephrolgist office. Continue to follow. THN CM Care Plan Problem One     Most Recent Value  Care Plan Problem One  at risk for readmission as evidence by recent admission.  Role Documenting the Problem One  Care Management [place] for Problem One  Active  [facility] Long Term Goal   client will not be readmitted within the next 31 days.  THN Long Term Goal Start Date  07/19/18  Interventions for Problem One Long Term Goal  RNCM discussed/reviewed most recent weights, RNCM encouraged client to call cardiologist to inform them of client's weights, RNCM encouraged client to call with questions/concers as  needed, care coordination regarding nephrology appointment.  THN CM Short Term Goal #1   client will verbalize attending scheduled appointments within the next 30 days.  THN CM Short Term Goal #1 Start Date  07/19/18  THN CM Short Term Goal #1 Met Date  08/09/18  THN CM Short Term Goal #2   client will verbalize contact with RNCM, 24 hour nurse advice line, providers as needed within the next 30 days.  THN CM Short Term Goal #2 Start Date  07/19/18  Interventions for Short Term Goal #2  confirmed client has RNCM's contact number.    THN CM Care Plan Problem Two     Most Recent Value  Care Plan Problem Two  knowledge defict regarding heart failure management.  Role Documenting the Problem Two  Care Management [place] for Problem Two  Active  Interventions for Problem Two Long Term Goal   weights discussed along with associated action regarding increased weights.  [place] Term Goal  client will express at least three strategies for heart failure management within the next 45-90 days.  THN Long Term Goal Start Date  07/24/18  THN CM Short Term Goal #1   client will weight and record weights daily within the next 30 days.  THN CM Short Term Goal #1 Start Date  07/24/18  Interventions for Short Term Goal #2   reviewed weights with client.  THN CM Short Term Goal #2   client will verbalize signs/symptoms of green zone and actions associated within the next 30 days.  THN CM Short Term Goal #2 Start Date  07/24/18  THN CM Short Term Goal #2 Met  Date  08/09/18     Thea Silversmith, RN, MSN, [place] Coordinator Cell: [phone]

## 2018-08-15 NOTE — Patient Outreach (Signed)
[place] [facility]) Care Management  08/15/2018  Mark Baird 1945/10/29 [id number]   [facility] CSW completed follow up call on mailed resources on 08/15/18. Patient answered and provided HIPPA verifications. Patient reports that he does not remember receiving these resources in the mail and ask if these can be resent to him. [name] provided additional education on mental health and senior resources within [facility]. [name] will send in basket message to [place] asking to re-send requested community resources to patient's residence.  Mark Baird, BSW, MSW, [email or web address] Phone: [phone] Fax: [phone]

## 2018-08-15 NOTE — Patient Outreach (Signed)
[facility] [facility]) Care Management  08/15/2018  Mark Baird. 04-22-1946 [id number]   Request received from [place], Eula Fried, to mail mental health and senior resources to patient.  CSW provided resources to be sent.  Resources mailed today.  Ronn Melena, BSW Social Worker [phone]

## 2018-08-15 NOTE — Patient Outreach (Signed)
[place] [facility]) Care Management  08/15/2018  Mark Baird. May 07, 1946 [id number]  Successful care coordination call to Dr. Irven Shelling office to clarify patient's med list.  Patient stated he had been taken off of ivabradine, Entresto, and spironolactone due to current renal function.  He has been referred to a nephrologist, however an appointment has not been made.  Left message with RN to return call.  PLAN: -I will await return call and hold on medication assistance until medication list finalized.  [name], PharmD, [place]  [phone]

## 2018-08-15 NOTE — Patient Outreach (Signed)
[place] [facility]) Care Management  08/15/2018  Quandre Polinski. 1945/09/18 [id number]   RNCM received return call. Client reports he called cardiologist office and provided his weights since Saturday and also informed that he has not been able to get a nephrology appointment. Client reports the nurse took down the information and will update cardiologist. RNCM provided positive feedback to client regarding self management of his disease process. RNCM encouraged client to continue to call as needed.   Plan: RNCM will continue to follow. Follow up within the next month. [facility] Pharmacist also involved in case.  Thea Silversmith, RN, MSN, [place] Coordinator Cell: [phone]'

## 2018-08-16 ENCOUNTER — Ambulatory Visit: Payer: Self-pay | Admitting: Pharmacist

## 2018-08-16 ENCOUNTER — Other Ambulatory Visit: Payer: Self-pay | Admitting: Pharmacist

## 2018-08-16 ENCOUNTER — Other Ambulatory Visit: Payer: Self-pay

## 2018-08-16 NOTE — Addendum Note (Signed)
Addended by: Lottie Dawson D on: 08/16/2018 03:55 PM   Modules accepted: Orders

## 2018-08-16 NOTE — Patient Outreach (Addendum)
[place] [facility]) Care Management  [place]  08/16/2018  Chriss Mannan. Jul 04, 1946 [id number]   Incoming call from Mr. Lortie with HIPAA identifiers verified.  Patient states he is doing well today and denies any SOB or additional edema.  He continues his daily weights.  He states he is currently holding his Entresto, Corlanor and spironolactone per cardiologist orders due to current renal function.  He will be seeing a nephrologist on 08/24/18.  Patient reviewed his remaining medications and is able to afford them.  No financial assistance is needed at this time since patient is currently not taking Entresto or Corlanor.  Medication list updated in [facility] per patient report.  Plan:  -I will follow up with patient s/p nephrology visit to assist with medication changes, etc -Patient was given [facility] CM Pharmacist callback information if any questions arise   [name], PharmD, [place]  [phone]

## 2018-08-16 NOTE — Patient Outreach (Signed)
[place] [facility]) Care Management  08/16/2018  Mark Baird Jul 04, 1946 [id number]   Care Coordination: RNCM returned call to nephrology office-spoke with Jeani Hawking. Client's appointment is scheduled for Friday, December 20th at 10:30 with Dr. Pearson Grippe. Client called and given this information along with address; contact name-Mark Baird and contact number.   Client reports his weight today is 179 pounds. He denies any issues or concerns at this time.  Plan: RNCM will continue to follow up call-next month.  Thea Silversmith, RN, MSN, [place] Coordinator Cell: [phone]

## 2018-08-21 ENCOUNTER — Ambulatory Visit: Payer: Self-pay | Admitting: Pharmacist

## 2018-08-24 DIAGNOSIS — Z8579 Personal history of other malignant neoplasms of lymphoid, hematopoietic and related tissues: Secondary | ICD-10-CM | POA: Diagnosis not present

## 2018-08-24 DIAGNOSIS — D631 Anemia in chronic kidney disease: Secondary | ICD-10-CM | POA: Diagnosis not present

## 2018-08-24 DIAGNOSIS — Z905 Acquired absence of kidney: Secondary | ICD-10-CM | POA: Diagnosis not present

## 2018-08-24 DIAGNOSIS — I509 Heart failure, unspecified: Secondary | ICD-10-CM | POA: Diagnosis not present

## 2018-08-24 DIAGNOSIS — I10 Essential (primary) hypertension: Secondary | ICD-10-CM | POA: Diagnosis not present

## 2018-08-27 ENCOUNTER — Ambulatory Visit: Payer: Self-pay | Admitting: Pharmacist

## 2018-08-27 ENCOUNTER — Other Ambulatory Visit: Payer: Self-pay | Admitting: Pharmacist

## 2018-08-27 NOTE — Patient Outreach (Addendum)
[place] [facility]) Care Management  [place]  08/27/2018  Dejean Tribby. 30-May-1946 [id number]   Reason for call: medication management  Unsuccessful telephone call attempt to patient.   HIPAA compliant voicemail left requesting a return call  Plan:  I will make another outreach attempt to patient within 7 business days   [name], PharmD, [place]  [phone]   '

## 2018-08-30 ENCOUNTER — Ambulatory Visit: Payer: Self-pay | Admitting: Pharmacist

## 2018-09-04 ENCOUNTER — Ambulatory Visit: Payer: Self-pay | Admitting: Pharmacist

## 2018-09-04 ENCOUNTER — Other Ambulatory Visit: Payer: Self-pay | Admitting: Licensed Clinical Social Worker

## 2018-09-04 ENCOUNTER — Other Ambulatory Visit: Payer: Self-pay | Admitting: Pharmacist

## 2018-09-04 NOTE — Patient Outreach (Signed)
[place] [facility]) Care Management  09/04/2018  Mark Baird 1946-07-05 [id number]  [facility] CSW completed outreach call to patient to follow up on mailed resources. Patient answered and provided HIPPA verifications. Patient reports that he successfully received resources this time that [facility] CSW re-sent. [name] completed entire review of socialization, senior resources and mental health resources. Patient was receptive to education. [facility] CSW encouraged patient to contact her if he has any questions or concerns in the future in regards to these resources. [name] will sign off at this time.  Eula Fried, BSW, [email or web address], [email or web address] Phone: [phone] Fax: [phone]

## 2018-09-04 NOTE — Patient Outreach (Addendum)
[place] [facility]) Care Management  [place]   09/04/2018  Mark Baird Mar 19, 1946 [id number]  Reason for referral: Medication Management  Referral source: [facility] RN Current insurance:[employer]  PMHx includes but not limited to:  CHF, AKI, anemia  Outreach:  . Successful telephone call with Mark Baird.  HIPAA identifiers verified.  Patient states he is doing well today.  He reports that he did go to the "kidney" doctor on 08/24/18.  I'm unable to see these records and patient is unable to recall exact numbers.  He does state that the MD office called him after his appt to let him know his labs had improved.  Patient reports the following medications are on hold per cardiologist (Dr. Einar Gip) since November: Entresto, spironolactone and ivabradine.  The latest laboratory data pulled from [facility] reports Scr of 2.73 on 07/25/18.  Patient denies SOB or edema.   Objective: Lab Results  Component Value Date   CREATININE 2.08 (H) 07/01/2018   CREATININE 2.05 (H) 06/30/2018   CREATININE 1.95 (H) 06/28/2018    No results found for: HGBA1C  Lipid Panel  No results found for: CHOL, TRIG, HDL, CHOLHDL, VLDL, LDLCALC, LDLDIRECT  BP Readings from Last 3 Encounters:  07/24/18 106/64  07/01/18 103/62  09/11/17 112/63    Allergies  Allergen Reactions  . Molds & Smuts Other (See Comments)    Due to allergy testing.  Marland Kitchen Penicillins Swelling    Has patient had a PCN reaction causing immediate rash, facial/tongue/throat swelling, SOB or lightheadedness with hypotension: Yes Has patient had a PCN reaction causing severe rash involving mucus membranes or skin necrosis: No Has patient had a PCN reaction that required hospitalization:No Has patient had a PCN reaction occurring within the last 10 years: No If all of the above answers are "NO", then may proceed with Cephalosporin use.     Medications Reviewed Today    Reviewed by Lavera Guise, [facility] (Pharmacist)  on 09/04/18 at 1009  Med List Status: <None>  Medication Order Taking? Sig Documenting Provider Last Dose Status Informant  carvedilol (COREG) 12.5 MG tablet [id number] Yes Take 12.5 mg by mouth 2 (two) times daily with a meal. [provider] Taking Active   furosemide (LASIX) 40 MG tablet [id number] Yes Take 1 tablet (40 mg total) by mouth 2 (two) times daily. Damita Lack, MD Taking Active Self  ivabradine (CORLANOR) 5 MG TABS tablet [id number] No Take 5 mg by mouth 2 (two) times daily with a meal. [provider] Not Taking Active Self           Med Note Blanca Friend, Latina Frank D   Thu Aug 16, 2018  3:53 PM) On hold d/t renal function  pravastatin (PRAVACHOL) 20 MG tablet [id number] Yes Take 20 mg by mouth daily. [provider] Taking Active Self  spironolactone (ALDACTONE) 25 MG tablet [id number] No Take 25 mg by mouth 2 (two) times daily. [provider] Not Taking Active            Med Note Blanca Friend, Sherian Maroon Aug 16, 2018  3:53 PM) On hold d/t renal function          Assessment:  Drugs sorted by system:  Cardiovascular: furosemide, carvedilol, pravastatin  Gastrointestinal: Senna S  Medication Review Findings:  . Patient reports the following medications are on hold per cardiologist since November: Entresto, spironolactone and ivabradine.  The latest laboratory data pulled from [facility] reports Scr of 2.73 on  07/25/18.   Marland Kitchen Patient states that the nephrology office called after his appt on 08/24/18 and said that his labs had improved. . Patient stated he needed the OTC medication Senna S to aid in BMs.  Encouraged patient to pick up at drugstore when he is out.  He has previously taken this.  It is not covered by insurance, therefore a prescription would not be helpful. . Medication list updated in [facility] to reflect current med list  Plan: -Will follow up with patient s/p cardiology visit on 09/24/18 with Dr. Einar Gip. Patient given [facility] PharmD callback should  any questions arise.  Patient very appreciative of [facility] efforts.   [name], PharmD, [place]  [phone]

## 2018-09-11 DIAGNOSIS — I5040 Unspecified combined systolic (congestive) and diastolic (congestive) heart failure: Secondary | ICD-10-CM | POA: Diagnosis not present

## 2018-09-11 DIAGNOSIS — I10 Essential (primary) hypertension: Secondary | ICD-10-CM | POA: Diagnosis not present

## 2018-09-12 ENCOUNTER — Ambulatory Visit (HOSPITAL_COMMUNITY)
Admission: RE | Admit: 2018-09-12 | Discharge: 2018-09-12 | Disposition: A | Discharge status: Home / Self Care | Payer: PPO | Source: Ambulatory Visit | Attending: Family Medicine | Admitting: Family Medicine

## 2018-09-12 ENCOUNTER — Other Ambulatory Visit (HOSPITAL_COMMUNITY): Payer: Self-pay | Admitting: Family Medicine

## 2018-09-12 ENCOUNTER — Other Ambulatory Visit: Payer: Self-pay

## 2018-09-12 DIAGNOSIS — R52 Pain, unspecified: Secondary | ICD-10-CM

## 2018-09-12 DIAGNOSIS — M19071 Primary osteoarthritis, right ankle and foot: Secondary | ICD-10-CM | POA: Diagnosis not present

## 2018-09-12 NOTE — Patient Outreach (Signed)
[place] [facility]) Care Management  09/12/2018  Mark Baird. 25-May-1946 [id number]  Unsuccessful telephone call to member.  Called member at [employer] preferred number for monthly telephone outreach call; however, no answer. HIPPA compliant voicemail message left introducing self and requesting return call at [facility] earliest convenience.  Will follow up with member within 3-4 business days.  [name], RN-BSN  [facility] Care Management  Community Care Management Coordinator  [phone] [email or web address]

## 2018-09-18 ENCOUNTER — Other Ambulatory Visit: Payer: Self-pay

## 2018-09-18 NOTE — Patient Outreach (Signed)
[place] [facility]) Care Management  09/18/2018  Mark Baird. 1946-07-13 [id number]  2nd Unsuccessful telephone call to member.  Called member at [employer] preferred number for [employer]; however, no answer. HIPPA compliant voicemail message left introducing self and requesting return call at [facility] earliest convenience.  Will follow up with member within 3-4 business days.  [name], RN-BSN  [facility] Care Management  Community Care Management Coordinator  [phone] [email or web address]

## 2018-09-24 ENCOUNTER — Other Ambulatory Visit: Payer: Self-pay

## 2018-09-24 DIAGNOSIS — I5021 Acute systolic (congestive) heart failure: Secondary | ICD-10-CM | POA: Diagnosis not present

## 2018-09-24 DIAGNOSIS — M189 Osteoarthritis of first carpometacarpal joint, unspecified: Secondary | ICD-10-CM | POA: Diagnosis not present

## 2018-09-24 DIAGNOSIS — I10 Essential (primary) hypertension: Secondary | ICD-10-CM | POA: Diagnosis not present

## 2018-09-24 DIAGNOSIS — M1000 Idiopathic gout, unspecified site: Secondary | ICD-10-CM | POA: Diagnosis not present

## 2018-09-24 DIAGNOSIS — N189 Chronic kidney disease, unspecified: Secondary | ICD-10-CM | POA: Diagnosis not present

## 2018-09-24 NOTE — Patient Outreach (Signed)
[place] [facility]) Care Management  09/24/2018  Mark Baird. 1946-03-09 [id number]  3rd Unsuccessful Outreach call to member.  Called member at [employer] preferred number for [employer]; however, no answer. HIPPA compliant voicemail message left introducing self and requesting return call at [facility] earliest convenience.  Will await for return call from member. Per workflow,  will place on hold on schedule for next 10 business days.  [name], RN-BSN  [facility] Care Management  Community Care Management Coordinator  [phone] [email or web address]

## 2018-09-24 NOTE — Patient Outreach (Signed)
[place] [facility]) Care Management  09/24/2018  Liborio Saccente. July 22, 1946 [id number]  Received return call from member and HIPPA identifiers completed. Member stated he is currently in process for going to visit with "[name]" due to  "L foot trouble". Member stated he will call later and will provide updated information concerning appointment.   Will await return call from member. Should member not return call, will call member within 3-4 business days. Will remove hold status.  [name], RN-BSN  [facility] Care Management  Community Care Management Coordinator  [phone] [email or web address]

## 2018-09-24 NOTE — Patient Outreach (Signed)
[name]) Care Management  09/24/2018  Mark Baird [birth year] [id number]  Per workflow, Unsuccessful Outreach Letter sent to member.   [name], RN-BSN  [facility] Care Management  Community Care Management Coordinator  [phone] [email or web address]

## 2018-09-25 ENCOUNTER — Ambulatory Visit: Payer: Self-pay | Admitting: Pharmacist

## 2018-09-27 ENCOUNTER — Other Ambulatory Visit: Payer: Self-pay | Admitting: Pharmacist

## 2018-09-27 ENCOUNTER — Ambulatory Visit: Payer: Self-pay | Admitting: Pharmacist

## 2018-09-27 NOTE — Patient Outreach (Signed)
[facility] [facility]) Care Management  Mark Baird  09/27/2018  Mark Baird. 01-03-1946 [id number]   Reason for call: medication management s/p cardiologist visit  Unsuccessful telephone call attempt to patient.   HIPAA compliant voicemail left requesting a return call  Plan:  I will make another outreach attempt to patient within 3-4 business days   [name], PharmD, [place]  [phone]

## 2018-09-28 ENCOUNTER — Other Ambulatory Visit: Payer: Self-pay

## 2018-09-28 NOTE — Patient Outreach (Signed)
[place] [facility]) Care Management  09/28/2018  Tracer Gutridge. Nov 06, 1945 [id number]   Unsuccessful Outreach Attempt.  Called member at [employer] preferred number forMonthlyTelephoneOutreachCall; however, no answer. HIPPA compliant voicemail message left introducing self and requesting return call at [facility] earliest convenience.  Will await for return call from member. Will return call to member within 3-4 business days if no return call from member.  [name], RN-BSN  [facility] Care Management  Community Care Management Coordinator  [phone] [email or web address]

## 2018-10-02 ENCOUNTER — Ambulatory Visit: Payer: Self-pay | Admitting: Pharmacist

## 2018-10-02 ENCOUNTER — Other Ambulatory Visit: Payer: Self-pay | Admitting: Pharmacist

## 2018-10-02 NOTE — Patient Outreach (Signed)
[place] [facility]) Care Management  [place]  10/02/2018  Mark Baird. [birth year] [id number]   Reason for call: medication management  Unsuccessful telephone call attempt to patient.   HIPAA compliant voicemail left requesting a return call  Plan:  I will make another outreach attempt to patient within 3-4 business days   [name], PharmD, [place]  [phone]

## 2018-10-03 ENCOUNTER — Other Ambulatory Visit: Payer: Self-pay

## 2018-10-03 NOTE — Patient Outreach (Addendum)
[facility] Unsuccessful Outreach.  Called member at [employer] preferred number forMonthlyTelephoneOutreachCall; however, no answer. HIPPA compliant voicemail message left requesting return call at [facility] earliest convenience.  Will await return call from member. Will follow up with member in 3-4 business days.   [name], RN-BSN  [facility] [place] Care Management  Community Care Management Coordinator  [phone] [email or web address]

## 2018-10-05 ENCOUNTER — Ambulatory Visit: Payer: Self-pay | Admitting: Pharmacist

## 2018-10-08 ENCOUNTER — Ambulatory Visit: Payer: Self-pay | Admitting: Pharmacist

## 2018-10-08 ENCOUNTER — Other Ambulatory Visit: Payer: Self-pay | Admitting: Pharmacist

## 2018-10-08 NOTE — Patient Outreach (Signed)
[place] [facility]) Care Management  [place]  10/08/2018  Mark Baird. 03/24/1946 [id number]   Reason for call: medication management  Unsuccessful telephone call attempt #1 to patient.   HIPAA compliant voicemail left requesting a return call  Plan:  I will make another outreach attempt to patient within 3-4 business days   [name], PharmD, [place]  [phone]

## 2018-10-09 ENCOUNTER — Ambulatory Visit: Payer: Self-pay

## 2018-10-11 ENCOUNTER — Other Ambulatory Visit: Payer: Self-pay | Admitting: Pharmacist

## 2018-10-11 ENCOUNTER — Other Ambulatory Visit: Payer: Self-pay

## 2018-10-11 ENCOUNTER — Ambulatory Visit: Payer: Self-pay | Admitting: Pharmacist

## 2018-10-11 NOTE — Patient Outreach (Signed)
[name]) Care Management  10/11/2018  Mark Baird. [birth year] [id number]  73 year old with history of Heart failure, CKD, anemia, cardiac arrhythmias, chest pain. Most recent hospitalization 10/24-10/27 with acute on chronic heart failure  Called member at [employer] preferred number forMonthlyTelephoneOutreachCall and member answered phone. HIPPA identifiers verified. Member stated the reason he has been unable to contact is due to his phone lines have been down and stated that currently, he is able to receive calls on his iphone.   Member stated he visited with Mark Baird 2 weeks ago and was provided for a prescription for gout and stated that his cousin picks up member's medication and goes grocery shopping for member.   Medications: Member unable to state which medications he is currently taking. Asked member if he would like for RN CM to come to assist with medications and member declined for this week; however, verbalized agreement with home visit next week for medication assistance.   Heart Failure: Member states he has not weighed self due to pain in his foot and inability to ambulate long distances. Member denies SOB and denies LE/Abdominal swelling.   Member denies needs at this time. Member provided with Nurse line number to call for advice if needed and member provided with contact number for this RN CM. Member states he knows how to call 911 if needed for emergencies.   Will follow with member next week for home visit to assist with medications.  [facility] CM Care Plan Problem One     Most Recent Value  Care Plan Problem One  knowledge deficit related to heart failure and prescribed medications  Role Documenting the Problem One  Care Management Coordinator  Care Plan for Problem One  Active  [facility] Long Term Goal   member will state names of medications and reasons for taking them within next 45 days  [facility] Long Term Goal Start Date  10/11/18  [name] Long Term Goal  Met Date  09/17/18  Interventions for Problem One Long Term Goal  Discussed medications with member,  Assessed knowledge of names of medication and reasons for use  [facility] CM Short Term Goal #1   Member will maintain compliance with taking medications as prescribed  [facility] CM Short Term Goal #1 Start Date  10/11/18  Interventions for Short Term Goal #1  Educated member on importance of taking medications as prescribed  [facility] CM Short Term Goal #2   Member will call 911 or nurse advice line when needed within next 30 days  [facility] CM Short Term Goal #2 Start Date  10/11/18  Interventions for Short Term Goal #2  provided member with number for nurse advice line    [name] CM Care Plan Problem Two     Most Recent Value  Care Plan Problem Two  knowledge defict regarding heart failure management.  Role Documenting the Problem Two  Care Management Coordinator  Care Plan for Problem Two  Active  Interventions for Problem Two Long Term Goal   Assessed member's knowledge of what to do if s/s of CHF,  Reinforced education of s/s of heart failure.  [name] Term Goal  client will express at least three strategies for heart failure management within the next 45-90 days.  [facility] Long Term Goal Start Date  10/11/18  [facility] CM Short Term Goal #1   client will weight and record weights daily within the next 30 days.  [facility] CM Short Term Goal #1 Start Date  10/11/18  Interventions for Short Term Goal #2   Assessed whether or not member has a working scale,  Assessed if weigh self daily,  discussed s/s of heart failure      Mark Baird, [place] Management Coordinator  [phone] [name]

## 2018-10-11 NOTE — Patient Outreach (Signed)
[place] [facility]) Care Management [place]  10/11/2018  Mark Baird Jan 24, 1946 [id number]  Reason for referral: medication management  [facility] pharmacy case is being closed due to the following reasons:  We have been unable to establish and/or maintain contact with the patient.  Have left multiple VMs for patient.  Mailbox is full today.  Will send letter with contact information if pharmacy concerns arise  Patient has been provided [facility] CM contact information if assistance needed in the future.    Thank you for allowing [facility] pharmacy to be involved in this patient's care.     [name], PharmD, [place]  [phone]

## 2018-10-17 ENCOUNTER — Other Ambulatory Visit: Payer: Self-pay

## 2018-10-17 NOTE — Patient Outreach (Addendum)
[place] [facility]) Care Management  10/17/2018  Darril Patriarca. July 07, 1946 [id number]  Received call from member and HIPPA identifiers verified. Member requesting to reschedule today's planned home visit for Tuesday 10-23-2018. Member states his left foot is more swollen that before and he plans to call his PCP or the nurse on call line for advise. Member states he has been taking his medications as prescribed and he elevates his foot at night. Member states he is unable to ambulate without assistance of his walker.  Will follow up with member next week for home visit to assist with medications and needs.   [name], RN-BSN  [facility] Care Management  Community Care Management Coordinator  [phone] [email or web address]

## 2018-10-23 ENCOUNTER — Other Ambulatory Visit: Payer: Self-pay

## 2018-10-23 ENCOUNTER — Telehealth: Payer: Self-pay

## 2018-10-23 NOTE — Patient Outreach (Addendum)
[place] [facility]) Care Management  10/23/2018  Mark Baird. [birth year] [id number]   BP 121/60 (BP Location: Left Arm, Patient Position: Sitting, Cuff Size: Normal)   Pulse 85   Resp 20   Wt 181 lb (82.1 kg)   SpO2 96%   BMI 25.92 kg/m   73 year old with history of Heart failure, CKD, anemia, cardiac arrhythmias, chest pain.Most recent hospitalization 10/24-10/27 with acute on chronic heart failure  Routine Home visit completed today. HIPPA identifiers verified.   Appointments: Member stated he did not attend PCP today as scheduled. Assisted member with calling PCP office and member will return call to re-schedule follow up visit after his friend provides information concerning when he can assist member with transportation. [facility] Cardiovascular Dr. Joya Gaskins Patwardhan's office at [phone] and spoke to [facility], nurse concerning whether or not member should be taking Coreg 12.5 mg as member has not been taking this medication. Juliann Pulse stated that member should be taking this medication; however, she will speak to Cardiologist and will make him aware that member has not been taking this medication and will notify RN CM and member if member is to start retaking this medication or not. Called member's pharmacy at [place] as member states he was told he need 2nd injection of flu shot. Pharmacist clarified that member was told he needs second injection of Shingles vaccination 2 months after first injection and stated that member may come at anytime to receive injection. Member states he thinks he has been given the second injection via his PCP or at [facility].   Medications: Verified members's current medications with member.   Heart Failure: Member states he has not weighed self due to pain in his foot and inability to ambulate long distances. Member denies SOB and denies LE/Abdominal swelling. Member states he has moved from upstairs to first  level in his home due to access to the bathroom and kitchen. This movement has assisted with decrease of pain in Left foot. Weighed member today on scale and weight 181 lbs.   Pain: Pain level 6 on scale 0-10 in L foot due to "gout" per member. Member has tylenol with codeine medication for pain; however, was not aware. Member also takes colchicine medication.  Member denies needs at this time. Member provided with Nurse line magnet and placed on refrigerator to call for advice if needed. [facility] Calendar given to member and v/s recorded. Member educated on use of calendar.  Member provided with contact number for this RN CM. Member states he knows how to call 911 if needed for emergencies.   THN CM Care Plan Problem One     Most Recent Value  Care Plan Problem One  knowledge deficit related to heart failure and prescribed medications  Role Documenting the Problem One  Care Management Coordinator  Care Plan for Problem One  Active  THN Long Term Goal   member will state names of medications and reasons for taking them within next 45 days  THN Long Term Goal Start Date  10/11/18  Interventions for Problem One Long Term Goal  Educated member about medications  THN CM Short Term Goal #1   Member will maintain compliance with taking medications as prescribed  THN CM Short Term Goal #1 Start Date  10/11/18  Interventions for Short Term Goal #1  Discussed coreg medication and reasons for not taking  THN CM Short Term Goal #2   Member will call 911 or nurse  advice line when needed within next 30 days  THN CM Short Term Goal #2 Start Date  10/11/18  Interventions for Short Term Goal #2  Provided member with Nurse on Line magnet for refrigerator,  Educated concerning to call when need advice any time    [facility] CM Care Plan Problem Two     Most Recent Value  Care Plan Problem Two  knowledge defict regarding heart failure management.  Role Documenting the Problem Two  Care Management [place]  for Problem Two  Active  Interventions for Problem Two Long Term Goal   Reinforced education about s/s of heart failure,  Reinforced education to call 911 when needed for emergency  [facility] Long Term Goal  client will express at least three strategies for heart failure management within the next 45-90 days.  THN Long Term Goal Start Date  10/11/18  THN CM Short Term Goal #1   client will weight and record weights daily within the next 30 days.  THN CM Short Term Goal #1 Start Date  10/11/18  Interventions for Short Term Goal #2   Provided member with [facility] Calendar and educated on where to document weights     Will send EMMI education for CKD and renal diet to member's home. Will follow up with member next month.  [name], RN-BSN  [facility] Care Management  Community Care Management Coordinator  [phone] [email or web address]

## 2018-10-23 NOTE — Telephone Encounter (Addendum)
Pt's nurse called concerned with his medication, Pt confused if he is to continue the carvedilol. Looked in previous office note from 08/13/2018. The OV note stated he is to continue it and hold the corlanor, but just wanted to check and make sure that is still correct.  Please Advise.

## 2018-10-23 NOTE — Telephone Encounter (Signed)
He needs to be seen for follow up. If I recollect, he missed an appt recently. Please arrange follow up with me where his medications can be appropriately reconciled.

## 2018-10-23 NOTE — Patient Outreach (Signed)
[place] [facility]) Care Management  10/23/2018  Mark Baird. Oct 12, 1945 [id number]  Called member at preferred number and member answered the phone. HIPPA identifiers verified. Member confirmed home visit today for medication assistance and member requesting assistance to call [place] due to member stated he received a flu shot and was told to return for a second injection; however, member missed the appointment. Member requesting assistance to reschedule appointment.  [name], RN-BSN  [facility] Care Management  Community Care Management Coordinator  [phone] [email or web address]

## 2018-10-24 NOTE — Telephone Encounter (Signed)
As of his last office visit with me on 08/13/2018, he should be on carvedilol.

## 2018-10-24 NOTE — Telephone Encounter (Signed)
Does the Pt need to still continue taking carvedilol until he is seen, or do you want him to stop until he is seen in office?

## 2018-10-30 NOTE — Telephone Encounter (Signed)
Called Pt and made him aware! Pt stated he is suffering from gout and that is why he has missed his appt and has not set up a F/U.  Wants to wait until he feels better.

## 2018-11-04 ENCOUNTER — Other Ambulatory Visit: Payer: Self-pay | Admitting: Cardiology

## 2018-11-06 ENCOUNTER — Other Ambulatory Visit: Payer: Self-pay | Admitting: Cardiology

## 2018-11-19 ENCOUNTER — Other Ambulatory Visit: Payer: Self-pay

## 2018-11-19 NOTE — Patient Outreach (Signed)
[facility]) Care Management  11/19/2018  [name]. 19-Oct-1945 [id number]  73 year old with history of Heart failure, CKD, anemia, cardiac arrhythmias, chest pain.Most recent hospitalization 10/24-10/27 with acute on chronic heart failure.  Appointments: Member states that he has not called to schedule PCP appointment as he plans to change PCPs and plans for new PCP next week. Member confirmed that he received a call from his Cardiologist who confirmed for member to continue taking his Coreg medication.   Member states weight today is 183 lbs and member denies falls. Member states that he does not add salt to his already prepared foods and states that his friend assists with meal preparations and also delivers meals to member's home. Member denies SOB and c/o swelling in hands, bilateral feet, abdomen.   Pain: Pain level 6 on scale 0-10 in L foot due to "gout" per member. Member has tylenol with codeine medication for pain; however, states that he forgot again about this medication. Member states that he will take pain medication to assist with decreasing pain. Member also takes colchicine medication.  Ambulation: Member states that he is ambulating better now and does not require the use of his walker as much.  Educated member concerning s/s of Coronavirus and member able to state when to call 911. Member denies s/s.  Will follow up with member next month.   THN CM Care Plan Problem One     Most Recent Value  Care Plan Problem One  knowledge deficit related to heart failure and prescribed medications  Role Documenting the Problem One  Care Management Coordinator  Care Plan for Problem One  Active  THN Long Term Goal   member will state names of medications and reasons for taking them within next 45 days  THN Long Term Goal Start Date  10/11/18  Interventions for Problem One Long Term Goal  Assessed member's knowledge of medications taken and reasons  THN CM  Short Term Goal #1   Member will maintain compliance with taking medications as prescribed  THN CM Short Term Goal #1 Start Date  10/11/18  [facility] CM Short Term Goal #2   Member will call 911 or nurse advice line when needed within next 30 days  THN CM Short Term Goal #2 Start Date  10/11/18    [facility] CM Care Plan Problem Two     Most Recent Value  Care Plan Problem Two  knowledge defict regarding heart failure management.  Role Documenting the Problem Two  Care Management Coordinator  Care Plan for Problem Two  Active  Interventions for Problem Two Long Term Goal   Discussed member's current salt intake  THN Long Term Goal  client will express at least three strategies for heart failure management within the next 45-90 days.  THN Long Term Goal Start Date  10/11/18  THN CM Short Term Goal #1   client will weight and record weights daily within the next 30 days.  THN CM Short Term Goal #1 Start Date  10/11/18  Interventions for Short Term Goal #2   Assessed member's current weightr      [name], [place] Management  [facility] Care Management Coordinator  [phone] [email or web address]

## 2018-12-18 DIAGNOSIS — N183 Chronic kidney disease, stage 3 (moderate): Secondary | ICD-10-CM | POA: Diagnosis not present

## 2018-12-18 DIAGNOSIS — E781 Pure hyperglyceridemia: Secondary | ICD-10-CM | POA: Diagnosis not present

## 2018-12-18 DIAGNOSIS — M545 Low back pain: Secondary | ICD-10-CM | POA: Diagnosis not present

## 2018-12-18 DIAGNOSIS — H33011 Retinal detachment with single break, right eye: Secondary | ICD-10-CM | POA: Diagnosis not present

## 2018-12-18 DIAGNOSIS — Z0000 Encounter for general adult medical examination without abnormal findings: Secondary | ICD-10-CM | POA: Diagnosis not present

## 2018-12-18 DIAGNOSIS — R42 Dizziness and giddiness: Secondary | ICD-10-CM | POA: Diagnosis not present

## 2018-12-18 DIAGNOSIS — I509 Heart failure, unspecified: Secondary | ICD-10-CM | POA: Diagnosis not present

## 2018-12-18 DIAGNOSIS — Z1389 Encounter for screening for other disorder: Secondary | ICD-10-CM | POA: Diagnosis not present

## 2018-12-18 DIAGNOSIS — G4700 Insomnia, unspecified: Secondary | ICD-10-CM | POA: Diagnosis not present

## 2018-12-18 DIAGNOSIS — I10 Essential (primary) hypertension: Secondary | ICD-10-CM | POA: Diagnosis not present

## 2018-12-18 DIAGNOSIS — M25511 Pain in right shoulder: Secondary | ICD-10-CM | POA: Diagnosis not present

## 2018-12-18 DIAGNOSIS — Z8579 Personal history of other malignant neoplasms of lymphoid, hematopoietic and related tissues: Secondary | ICD-10-CM | POA: Diagnosis not present

## 2018-12-18 DIAGNOSIS — Z13220 Encounter for screening for lipoid disorders: Secondary | ICD-10-CM | POA: Diagnosis not present

## 2018-12-18 DIAGNOSIS — Z1211 Encounter for screening for malignant neoplasm of colon: Secondary | ICD-10-CM | POA: Diagnosis not present

## 2018-12-18 DIAGNOSIS — M79674 Pain in right toe(s): Secondary | ICD-10-CM | POA: Diagnosis not present

## 2018-12-18 DIAGNOSIS — I129 Hypertensive chronic kidney disease with stage 1 through stage 4 chronic kidney disease, or unspecified chronic kidney disease: Secondary | ICD-10-CM | POA: Diagnosis not present

## 2018-12-18 DIAGNOSIS — Z125 Encounter for screening for malignant neoplasm of prostate: Secondary | ICD-10-CM | POA: Diagnosis not present

## 2018-12-20 NOTE — Patient Outreach (Signed)
[place] [facility]) Care Management  12/20/2018  Mark Baird. 1946-02-21 [id number]   Unsuccessful Outreach x38  73 year old with history of Heart failure, CKD, anemia, cardiac arrhythmias, chest pain.Most recent hospitalization 10/24-10/27 with acute on chronic heart failure.  Called member at preferred number; however, no answer and unable to leave HIPPA compliant voicemail message.  Will return call from member within 3-4 business days.   [name], RN-BSN  [facility] Care Management  Community Care Management Coordinator  [phone] [email or web address]

## 2018-12-21 ENCOUNTER — Other Ambulatory Visit: Payer: Self-pay

## 2018-12-21 NOTE — Telephone Encounter (Signed)
This encounter was created in error - please disregard.

## 2018-12-21 NOTE — Patient Outreach (Signed)
[place] [facility]) Care Management  12-20-2018  Eustacio Ellen. [birth year] [id number]   Unsuccessful Outreach x1 on yesterday. Previous encounter for yesterday deleted in error.  73 year old with history of Heart failure, CKD, anemia, cardiac arrhythmias, chest pain.Most recent hospitalization 10/24-10/27 with acute on chronic heart failure.   Called member at preferred number; however, no answer.   Will return call to member within 3-4 business days.  [name], RN-BSN  [facility] Care Management  Community Care Management Coordinator  [phone] [email or web address]

## 2018-12-25 ENCOUNTER — Other Ambulatory Visit: Payer: Self-pay

## 2018-12-25 NOTE — Patient Outreach (Signed)
[place] [facility]) Care Management  12/25/2018  Gumecindo Hopkin. 05/16/1946 [id number]   Unsuccessful Outreach x 51.   73 year old with history of Heart failure, CKD, anemia, cardiac arrhythmias, chest pain.Most recent hospitalization 10/24-10/27 with acute on chronic heart failure.  Called member at preferred number; however, mailbox full, unable to leave HIPPA compliant voicemail.  Will return call to member within 3-4 business days.  [name], RN-BSN  [facility] Care Management  Community Care Management Coordinator  [phone] [email or web address]

## 2018-12-30 NOTE — Progress Notes (Signed)
Telephone visit note  Subjective:   Mark Baird., male    DOB: [birth year], 73 y.o.   MRN: [id number]   I connected with the patient on 12/31/18 by a video enabled telemedicine application and verified that I am speaking with the correct person using two identifiers.     I discussed the limitations of evaluation and management by telemedicine and the availability of in person appointments. The patient expressed understanding and agreed to proceed.   This visit type was conducted due to national recommendations for restrictions regarding the COVID-19 Pandemic (e.g. social distancing).  This format is felt to be most appropriate for this patient at this time.  All issues noted in this document were discussed and addressed.  No physical exam was performed (except for noted visual exam findings with Tele health visits).  The patient has consented to conduct a Tele health visit and understands insurance will be billed.    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  Patient was recently seen by neophrologist Dr. Joelyn Oms. His lasix was resumed at 80 mg PO bid, along with KCl 40 mEq. He had previously been on Entresto, which was stopped due to elevated Cr up to 2.7.   Patient had his telephone visit with the help of his cousin Derald Macleod Odums. Patient has not been taking lasix as prescribed by Dr. Joelyn Oms. He is not taking carvedilol 12. Mg bid. He continues to have bilateral leg swelling. He reports that his mobility is currently limited due to his leg swelling. I verified that he has about 20 pills of torsemide 20 mg, but does not have any lasix or potassium pills.     Past Medical History:  Diagnosis Date  . Cancer [facility])       Past Surgical History:  Procedure Laterality Date  . RIGHT/LEFT HEART CATH AND  CORONARY ANGIOGRAPHY N/A 09/11/2017   Procedure: RIGHT/LEFT HEART CATH AND CORONARY ANGIOGRAPHY;  Surgeon: Nigel Mormon, MD;  Location: [facility];  Service: Cardiovascular;  Laterality: N/A;      Social History   Socioeconomic History  . Marital status: Married    Spouse name: Not on file  . Number of children: Not on file  . Years of education: Not on file  . Highest education level: Not on file  Occupational History  . Not on file  Social Needs  . Financial resource strain: Not on file  . Food insecurity:    Worry: Not on file    Inability: Not on file  . Transportation needs:    Medical: Not on file    Non-medical: Not on file  Tobacco Use  . Smoking status: Never Smoker  . Smokeless tobacco: Never Used  Substance and Sexual Activity  . Alcohol use: No  . Drug use: Not on file  . Sexual activity: Not on file  Lifestyle  . Physical activity:    Days per week: Not on file    Minutes per session: Not on file  . Stress: Not on file  Relationships  . Social connections:    Talks on phone: Not on file    Gets together: Not on file    Attends religious service: Not on file    Active member of club or organization: Not on file    Attends meetings of clubs or organizations: Not on file  Relationship status: Not on file  . Intimate partner violence:    Fear of current or ex partner: Not on file    Emotionally abused: Not on file    Physically abused: Not on file    Forced sexual activity: Not on file  Other Topics Concern  . Not on file  Social History Narrative  . Not on file      Current Outpatient Medications on File Prior to Visit  Medication Sig Dispense Refill  . acetaminophen-codeine (TYLENOL #3) 300-30 MG tablet Take 1 tablet by mouth every 4 (four) hours as needed for moderate pain.    Marland Kitchen colchicine 0.6 MG tablet Take 0.6 mg by mouth daily.    . carvedilol (COREG) 12.5 MG tablet TAKE 1 TABLET BY MOUTH TWICE DAILY (Patient not taking:  Reported on 12/31/2018) 180 tablet 1  . pravastatin (PRAVACHOL) 20 MG tablet Take 20 mg by mouth daily.     No current facility-administered medications on file prior to visit.     Cardiovascular studies:  EKG 06/29/2018: Sinus arrhythmia, PVC. IVCD  [facility] 09/11/2017: No angiographically significant coronary artery disease Well compensated nonischemic cardiomyopathy  Echocardiogram 09/14/2017: 1. Left ventricle cavity is normal in size. Moderate concentric hypertrophy of the left ventricle. Visual EF is 25-30%. Abnormal septal wall motion due to left bundle branch block. All other walls are moderate to severely hypokinetic. Doppler evidence of grade III (restrictive) diastolic dysfunction. Calculated EF 22%. 2. Left atrial cavity is normal in size. 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 3. Mild (Grade I) aortic regurgitation. 4. Mild (Grade I) mitral regurgitation. 5. Mild tricuspid regurgitation. No evidence of pulmonary hypertension.  Recent labs: 12/18/2018: Glucose 150. BUN/Cr 14/1.53. eGFR 51. Na/K 136/3.1.  Hb 9.6 g/dl Iron 115 mcg/dL (38-169) TIBC 231 mcg/dL (20-40) Iron sat 50% (15-55%)   Review of Systems  Constitution: Negative for decreased appetite, malaise/fatigue, weight gain and weight loss.  HENT: Negative for congestion.   Eyes: Negative for visual disturbance.  Cardiovascular: Positive for leg swelling. Negative for chest pain, dyspnea on exertion, palpitations and syncope.  Respiratory: Negative for shortness of breath.   Endocrine: Negative for cold intolerance.  Hematologic/Lymphatic: Does not bruise/bleed easily.  Skin: Negative for itching and rash.  Musculoskeletal: Negative for myalgias.  Gastrointestinal: Negative for abdominal pain, nausea and vomiting.  Genitourinary: Negative for dysuria.  Neurological: Negative for dizziness and weakness.  Psychiatric/Behavioral: The patient is not  nervous/anxious.   All other systems reviewed and are negative.        Vitals:   12/18/18 1052  BP: 126/78  Pulse: 81   During office visit with Nephrology  Physical Exam  Constitutional: He is oriented to person, place, and time. He appears well-developed and well-nourished. No distress.  Pulmonary/Chest: Effort normal.  Musculoskeletal:        General: Edema (2+ b/l leg edema) present.  Neurological: He is alert and oriented to person, place, and time.  Psychiatric: He has a normal mood and affect.  Nursing note and vitals reviewed.          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  HFrEF: NYHA class II-III. Nonischemic cardiomyopathy. He is currently not taking any diuretic.  I have asked him to start taking torsemide 20 mg pills bid (He currently has torsemide at home), along with KCl 40 mEq bid.  Continue carvedilol 12.5 mg bid. He is at high risk for heart failure decompensation and hospital admission. I will check labs this week and seen him for follow up in office next week. Based on his lab findings, I may add spironolactone.   CKD3: Continue follow up with nephrology  Anemia: Likely due to CKD. Iron is repleted. Follow up with nephrology.  Nigel Mormon, MD [facility] Cardiovascular. [place] Pager: [phone] Office: [phone] If no answer Cell [phone]

## 2018-12-31 ENCOUNTER — Other Ambulatory Visit: Payer: Self-pay

## 2018-12-31 ENCOUNTER — Ambulatory Visit: Payer: PPO | Admitting: Cardiology

## 2018-12-31 ENCOUNTER — Encounter: Payer: Self-pay | Admitting: Cardiology

## 2018-12-31 VITALS — BP 126/78 | HR 81

## 2018-12-31 DIAGNOSIS — D631 Anemia in chronic kidney disease: Secondary | ICD-10-CM | POA: Diagnosis not present

## 2018-12-31 DIAGNOSIS — N183 Chronic kidney disease, stage 3 unspecified: Secondary | ICD-10-CM | POA: Insufficient documentation

## 2018-12-31 DIAGNOSIS — I5022 Chronic systolic (congestive) heart failure: Secondary | ICD-10-CM | POA: Diagnosis not present

## 2018-12-31 DIAGNOSIS — I129 Hypertensive chronic kidney disease with stage 1 through stage 4 chronic kidney disease, or unspecified chronic kidney disease: Secondary | ICD-10-CM

## 2018-12-31 MED ORDER — POTASSIUM CHLORIDE CRYS ER 20 MEQ PO TBCR: 40.0000 meq | EXTENDED_RELEASE_TABLET | Freq: Two times a day (BID) | ORAL | 2 refills | Status: DC

## 2018-12-31 NOTE — Patient Outreach (Addendum)
[facility]) Care Management  12/31/2018  Devinn Hurwitz. Jan 10, 1946 [id number]  Unsuccessful Outreach x 39   73 year old with history of Heart failure, CKD, anemia, cardiac arrhythmias, chest pain.Most recent hospitalization 10/24-10/27 with acute on chronic heart failure.  Called member at preferred number; however, mailbox full, unable to leave HIPPA compliant voicemail.  Will send Unsuccessful Outreach Letter.  [name], RN-BSN  [facility] Care Management  Community Care Management Coordinator  [phone] [email or web address]

## 2019-01-04 DIAGNOSIS — I5022 Chronic systolic (congestive) heart failure: Secondary | ICD-10-CM | POA: Diagnosis not present

## 2019-01-05 LAB — BASIC METABOLIC PANEL
BUN/Creatinine Ratio: 14 (ref 10–24)
BUN: 23 mg/dL (ref 8–27)
CO2: 26 mmol/L (ref 20–29)
Calcium: 8.7 mg/dL (ref 8.6–10.2)
Chloride: 99 mmol/L (ref 96–106)
Creatinine, Ser: 1.67 mg/dL — ABNORMAL HIGH (ref 0.76–1.27)
GFR calc Af Amer: 46 mL/min/{1.73_m2} — ABNORMAL LOW (ref >59)
GFR calc non Af Amer: 40 mL/min/{1.73_m2} — ABNORMAL LOW (ref >59)
Glucose: 111 mg/dL — ABNORMAL HIGH (ref 65–99)
Potassium: 3.5 mmol/L (ref 3.5–5.2)
Sodium: 140 mmol/L (ref 134–144)

## 2019-01-08 NOTE — Progress Notes (Signed)
Telephone visit note  Subjective:   Mark Guarneri., male    DOB: [birth year], 73 y.o.   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  At last visit on 12/31/2018, I recommended torsemide 20 mg daily, along with Kcl 40 mEq bid, continued carvedilol 12.5 mg bid. Subsequent labwork shopwed Cr improved to 1.6, K at 3.5  He is here for his follow up visit. He continues to have exertional dyspnea. Leg edema persists, especially in left leg. He denies any presyncope or syncope. He ran out of his carvedilol yesterday. His blood pressure is low today. He does not have a BP monitor at home.   Past Medical History:  Diagnosis Date  . Cancer [facility])       Past Surgical History:  Procedure Laterality Date  . RIGHT/LEFT HEART CATH AND CORONARY ANGIOGRAPHY N/A 09/11/2017   Procedure: RIGHT/LEFT HEART CATH AND CORONARY ANGIOGRAPHY;  Surgeon: Nigel Mormon, MD;  Location: [place] [facility];  Service: Cardiovascular;  Laterality: N/A;      Social History   Socioeconomic History  . Marital status: Single    Spouse name: Not on file  . Number of children: 0  . Years of education: Not on file  . Highest education level: Not on file  Occupational History  . Not on file  Social Needs  . Financial resource strain: Not on file  . Food insecurity:    Worry: Not on file    Inability: Not on file  . Transportation needs:    Medical: Not on file    Non-medical: Not on file  Tobacco Use  . Smoking status: Former Smoker    Packs/day: 1.00    Types: Cigarettes  . Smokeless tobacco: Never Used  Substance and Sexual Activity  . Alcohol use: No  . Drug use: Not on file  . Sexual activity: Not on file  Lifestyle  . Physical activity:    Days per week: Not on file    Minutes per session: Not on  file  . Stress: Not on file  Relationships  . Social connections:    Talks on phone: Not on file    Gets together: Not on file    Attends religious service: Not on file    Active member of club or organization: Not on file    Attends meetings of clubs or organizations: Not on file    Relationship status: Not on file  . Intimate partner violence:    Fear of current or ex partner: Not on file    Emotionally abused: Not on file    Physically abused: Not on file    Forced sexual activity: Not on file  Other Topics Concern  . Not on file  Social History Narrative  . Not on file      Current Outpatient Medications on File Prior to Visit  Medication Sig Dispense Refill  . acetaminophen-codeine (TYLENOL #3) 300-30 MG tablet Take 1 tablet by mouth every 4 (four) hours as needed for moderate pain.    Marland Kitchen colchicine 0.6 MG tablet Take 0.6 mg by mouth daily.    . carvedilol (COREG) 12.5 MG tablet TAKE 1 TABLET BY MOUTH TWICE DAILY (Patient not taking: Reported on 12/31/2018) 180 tablet 1  . pravastatin (PRAVACHOL) 20 MG tablet Take 20 mg by mouth daily.  No current facility-administered medications on file prior to visit.     Cardiovascular studies:  EKG 06/29/2018: Sinus arrhythmia, PVC. IVCD  [facility] 09/11/2017: No angiographically significant coronary artery disease Well compensated nonischemic cardiomyopathy  Echocardiogram 09/14/2017: 1. Left ventricle cavity is normal in size. Moderate concentric hypertrophy of the left ventricle. Visual EF is 25-30%. Abnormal septal wall motion due to left bundle branch block. All other walls are moderate to severely hypokinetic. Doppler evidence of grade III (restrictive) diastolic dysfunction. Calculated EF 22%. 2. Left atrial cavity is normal in size. 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 3. Mild (Grade I) aortic regurgitation. 4. Mild (Grade I) mitral regurgitation. 5. Mild tricuspid  regurgitation. No evidence of pulmonary hypertension.  Recent labs: 12/18/2018: Results for Mark Baird, Mark Baird (MRN [id number]) as of 01/08/2019 21:53  Ref. Range 01/04/2019 98:92  BASIC METABOLIC PANEL Unknown Rpt (A)  Sodium Latest Ref Range: 134 - 144 mmol/L 140  Potassium Latest Ref Range: 3.5 - 5.2 mmol/L 3.5  Chloride Latest Ref Range: 96 - 106 mmol/L 99  CO2 Latest Ref Range: 20 - 29 mmol/L 26  Glucose Latest Ref Range: 65 - 99 mg/dL 111 (H)  BUN Latest Ref Range: 8 - 27 mg/dL 23  Creatinine Latest Ref Range: 0.76 - 1.27 mg/dL 1.67 (H)  Calcium Latest Ref Range: 8.6 - 10.2 mg/dL 8.7  BUN/Creatinine Ratio Latest Ref Range: 10 - 24  14  GFR, Est Non African American Latest Ref Range: >59 mL/min/1.73 40 (L)  GFR, Est African American Latest Ref Range: >59 mL/min/1.73 46 (L)    Review of Systems  Constitution: Negative for decreased appetite, malaise/fatigue, weight gain and weight loss.  HENT: Negative for congestion.   Eyes: Negative for visual disturbance.  Cardiovascular: Positive for dyspnea on exertion and leg swelling. Negative for chest pain, palpitations and syncope.  Respiratory: Positive for shortness of breath.   Endocrine: Negative for cold intolerance.  Hematologic/Lymphatic: Does not bruise/bleed easily.  Skin: Negative for itching and rash.  Musculoskeletal: Negative for myalgias.  Gastrointestinal: Negative for abdominal pain, nausea and vomiting.  Genitourinary: Negative for dysuria.  Neurological: Negative for dizziness and weakness.  Psychiatric/Behavioral: The patient is not nervous/anxious.   All other systems reviewed and are negative.        Vitals:   01/09/19 1022 01/09/19 1030  BP: (!) 82/52 (!) 80/49  Pulse: 78   SpO2: 98%    Physical Exam  Constitutional: He is oriented to person, place, and time. He appears well-developed and well-nourished. No distress.  HENT:  Head: Normocephalic and atraumatic.  Eyes: Pupils are equal, round, and  reactive to light. Conjunctivae are normal.  Neck: No JVD present.  Cardiovascular: Normal rate, regular rhythm and intact distal pulses.  Pulmonary/Chest: Effort normal and breath sounds normal. He has no wheezes. He has no rales.  Abdominal: Soft. Bowel sounds are normal. There is no rebound.  Musculoskeletal:        General: Edema (Lt leg 2+, Rt leg trace) present.  Lymphadenopathy:    He has no cervical adenopathy.  Neurological: He is alert and oriented to person, place, and time. No cranial nerve deficit.  Skin: Skin is warm and dry.  Psychiatric: He has a normal mood and affect.  Nursing note and vitals reviewed.          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  HFrEF: NYHA class II-III. Nonischemic cardiomyopathy. BP is low today without any low output symptoms.  He has ran out of carvedilol, which he was taking 12.5 mg bid. I will disocontinue carvedilol. Beta blocker will need to be reinitiated at much lower dose in future, if possible.  Hold Entresto for next two days. Reduce torsemide to 20 mg daily, Kcl to 40 mEq once daily. I am concerned about his low blood pressure and inability to tolerate heart failure therapy.  I will see him back in two days to reassess his blood pressure. Reemphasized importance of low salt diet.  CKD3: Continue follow up with nephrology  Anemia: Likely due to CKD. Iron is repleted. Follow up with nephrology.  Nigel Mormon, MD [facility]. [place] Pager: [phone] Office: [phone] If no answer Cell [phone]

## 2019-01-09 ENCOUNTER — Ambulatory Visit (INDEPENDENT_AMBULATORY_CARE_PROVIDER_SITE_OTHER): Payer: PPO | Admitting: Cardiology

## 2019-01-09 ENCOUNTER — Other Ambulatory Visit: Payer: Self-pay

## 2019-01-09 ENCOUNTER — Encounter: Payer: Self-pay | Admitting: Cardiology

## 2019-01-09 VITALS — BP 80/49 | HR 78 | Ht 71.0 in | Wt 180.0 lb

## 2019-01-09 DIAGNOSIS — N183 Chronic kidney disease, stage 3 unspecified: Secondary | ICD-10-CM

## 2019-01-09 DIAGNOSIS — I129 Hypertensive chronic kidney disease with stage 1 through stage 4 chronic kidney disease, or unspecified chronic kidney disease: Secondary | ICD-10-CM

## 2019-01-09 DIAGNOSIS — I959 Hypotension, unspecified: Secondary | ICD-10-CM

## 2019-01-09 DIAGNOSIS — I5022 Chronic systolic (congestive) heart failure: Secondary | ICD-10-CM

## 2019-01-09 DIAGNOSIS — D631 Anemia in chronic kidney disease: Secondary | ICD-10-CM | POA: Diagnosis not present

## 2019-01-11 ENCOUNTER — Other Ambulatory Visit: Payer: Self-pay

## 2019-01-11 ENCOUNTER — Ambulatory Visit (INDEPENDENT_AMBULATORY_CARE_PROVIDER_SITE_OTHER): Payer: PPO | Admitting: Cardiology

## 2019-01-11 ENCOUNTER — Encounter: Payer: Self-pay | Admitting: Cardiology

## 2019-01-11 VITALS — BP 92/55 | HR 74 | Temp 96.3°F | Ht 71.0 in | Wt 168.0 lb

## 2019-01-11 DIAGNOSIS — I5022 Chronic systolic (congestive) heart failure: Secondary | ICD-10-CM

## 2019-01-11 DIAGNOSIS — N183 Chronic kidney disease, stage 3 unspecified: Secondary | ICD-10-CM

## 2019-01-11 DIAGNOSIS — I129 Hypertensive chronic kidney disease with stage 1 through stage 4 chronic kidney disease, or unspecified chronic kidney disease: Secondary | ICD-10-CM

## 2019-01-11 NOTE — Progress Notes (Signed)
Telephone visit note  Subjective:   Mark Guarneri., male    DOB: 04/28/1946, 73 y.o.   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  At last visit two days ago, I had stopped his carvedilol 12.5 mg bid, as well as torsemide, due to BP 80s/50s. I continued torsemide 20 mg once daily, and Kdur 40 mEq  Once daily.  He is here for follow up visit today. His BP is slightly better. He denies any presyncope/syncope symptoms. Edema has improved as well.    Past Medical History:  Diagnosis Date  . Multiple myeloma ([place])   . Solitary kidney       Past Surgical History:  Procedure Laterality Date  . RIGHT/LEFT HEART CATH AND CORONARY ANGIOGRAPHY N/A 09/11/2017   Procedure: RIGHT/LEFT HEART CATH AND CORONARY ANGIOGRAPHY;  Surgeon: Nigel Mormon, MD;  Location: [place] [facility];  Service: Cardiovascular;  Laterality: N/A;      Social History   Socioeconomic History  . Marital status: Single    Spouse name: Not on file  . Number of children: 0  . Years of education: Not on file  . Highest education level: Not on file  Occupational History  . Not on file  Social Needs  . Financial resource strain: Not on file  . Food insecurity:    Worry: Not on file    Inability: Not on file  . Transportation needs:    Medical: Not on file    Non-medical: Not on file  Tobacco Use  . Smoking status: Former Smoker    Packs/day: 1.00    Types: Cigarettes  . Smokeless tobacco: Never Used  Substance and Sexual Activity  . Alcohol use: No  . Drug use: Not on file  . Sexual activity: Not on file  Lifestyle  . Physical activity:    Days per week: Not on file    Minutes per session: Not on file  . Stress: Not on file  Relationships  . Social connections:    Talks on phone: Not on file    Gets  together: Not on file    Attends religious service: Not on file    Active member of club or organization: Not on file    Attends meetings of clubs or organizations: Not on file    Relationship status: Not on file  . Intimate partner violence:    Fear of current or ex partner: Not on file    Emotionally abused: Not on file    Physically abused: Not on file    Forced sexual activity: Not on file  Other Topics Concern  . Not on file  Social History Narrative  . Not on file      Current Outpatient Medications on File Prior to Visit  Medication Sig Dispense Refill  . acetaminophen-codeine (TYLENOL #3) 300-30 MG tablet Take 1 tablet by mouth every 4 (four) hours as needed for moderate pain.    Marland Kitchen colchicine 0.6 MG tablet Take 0.6 mg by mouth daily.    . carvedilol (COREG) 12.5 MG tablet TAKE 1 TABLET BY MOUTH TWICE DAILY (Patient not taking: Reported on 12/31/2018) 180 tablet 1  . pravastatin (PRAVACHOL) 20 MG tablet Take 20 mg by mouth daily.     No current facility-administered medications on file prior to visit.     Cardiovascular studies:  EKG  01/11/2019: Sinus rhythm  First degree AV block Left bundle branch block.   R/LHC 09/11/2017: No angiographically significant coronary artery disease Well compensated nonischemic cardiomyopathy  Echocardiogram 09/14/2017: 1. Left ventricle cavity is normal in size. Moderate concentric hypertrophy of the left ventricle. Visual EF is 25-30%. Abnormal septal wall motion due to left bundle branch block. All other walls are moderate to severely hypokinetic. Doppler evidence of grade III (restrictive) diastolic dysfunction. Calculated EF 22%. 2. Left atrial cavity is normal in size. 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 3. Mild (Grade I) aortic regurgitation. 4. Mild (Grade I) mitral regurgitation. 5. Mild tricuspid regurgitation. No evidence of pulmonary hypertension.  Recent  labs: 12/18/2018: Results for ZOEY, GILKESON (MRN [id number]) as of 01/08/2019 21:53  Ref. Range 01/04/2019 35:00  BASIC METABOLIC PANEL Unknown Rpt (A)  Sodium Latest Ref Range: 134 - 144 mmol/L 140  Potassium Latest Ref Range: 3.5 - 5.2 mmol/L 3.5  Chloride Latest Ref Range: 96 - 106 mmol/L 99  CO2 Latest Ref Range: 20 - 29 mmol/L 26  Glucose Latest Ref Range: 65 - 99 mg/dL 111 (H)  BUN Latest Ref Range: 8 - 27 mg/dL 23  Creatinine Latest Ref Range: 0.76 - 1.27 mg/dL 1.67 (H)  Calcium Latest Ref Range: 8.6 - 10.2 mg/dL 8.7  BUN/Creatinine Ratio Latest Ref Range: 10 - 24  14  GFR, Est Non African American Latest Ref Range: >59 mL/min/1.73 40 (L)  GFR, Est African American Latest Ref Range: >59 mL/min/1.73 46 (L)    Review of Systems  Constitution: Negative for decreased appetite, malaise/fatigue, weight gain and weight loss.  HENT: Negative for congestion.   Eyes: Negative for visual disturbance.  Cardiovascular: Positive for dyspnea on exertion and leg swelling. Negative for chest pain, palpitations and syncope.  Respiratory: Positive for shortness of breath.   Endocrine: Negative for cold intolerance.  Hematologic/Lymphatic: Does not bruise/bleed easily.  Skin: Negative for itching and rash.  Musculoskeletal: Negative for myalgias.  Gastrointestinal: Negative for abdominal pain, nausea and vomiting.  Genitourinary: Negative for dysuria.  Neurological: Negative for dizziness and weakness.  Psychiatric/Behavioral: The patient is not nervous/anxious.   All other systems reviewed and are negative.        Vitals:   01/11/19 0853  BP: (!) 92/55  Pulse: 74  Temp: (!) 96.3 F (35.7 C)  SpO2: 99%   Physical Exam  Constitutional: He is oriented to person, place, and time. He appears well-developed and well-nourished. No distress.  HENT:  Head: Normocephalic and atraumatic.  Eyes: Pupils are equal, round, and reactive to light. Conjunctivae are normal.  Neck: No JVD present.   Cardiovascular: Normal rate, regular rhythm and intact distal pulses.  Pulmonary/Chest: Effort normal and breath sounds normal. He has no wheezes. He has no rales.  Abdominal: Soft. Bowel sounds are normal. There is no rebound.  Musculoskeletal:        General: Edema (Lt leg 2+, Rt leg trace) present.  Lymphadenopathy:    He has no cervical adenopathy.  Neurological: He is alert and oriented to person, place, and time. No cranial nerve deficit.  Skin: Skin is warm and dry.  Psychiatric: He has a normal mood and affect.  Nursing note and vitals reviewed.          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  HFrEF: NYHA class  II-III. Nonischemic cardiomyopathy. Blood pressure slightly improved without any low output symptoms. I am still skeptical about resuming heart failure meds given borderline blood pressure.. Continue torsemide and Kdur for now. I will see him back in 2 weeks and attempt reintroducing heart failure meds. If unable tolerate, may need to consider CRT-D sooner. Encourage oral hydration for now. Continue to restrict salt intake. Encourage regular BO and weight checks at home.   CKD3: Continue follow up with nephrology  Anemia: Likely due to CKD. Iron is repleted. Follow up with nephrology.  Nigel Mormon, MD [facility]. [place] Pager: [phone] Office: [phone] If no answer Cell [phone]

## 2019-01-11 NOTE — Patient Outreach (Addendum)
[facility]) Care Management  01/11/2019  [name]. November 06, 1945 [id number]   Unsuccessful Outreach [phone]  73 year old with history of Heart failure, CKD, anemia, cardiac arrhythmias, chest pain.Most recent hospitalization 10/24-10/27 with acute on chronic heart failure.  Called member at preferred number; however, mailbox full, unable to leave HIPPA compliant voicemail.  Will close case. Will send Physician case closure letter. Will send patient case closure letter.  [name], RN-BSN  [facility] Care Management  Community Care Management Coordinator  [phone] [email or web address]

## 2019-01-16 ENCOUNTER — Ambulatory Visit: Payer: PPO

## 2019-01-17 DIAGNOSIS — M129 Arthropathy, unspecified: Secondary | ICD-10-CM | POA: Diagnosis not present

## 2019-01-17 DIAGNOSIS — R5383 Other fatigue: Secondary | ICD-10-CM | POA: Diagnosis not present

## 2019-01-17 DIAGNOSIS — Z131 Encounter for screening for diabetes mellitus: Secondary | ICD-10-CM | POA: Diagnosis not present

## 2019-01-17 DIAGNOSIS — E559 Vitamin D deficiency, unspecified: Secondary | ICD-10-CM | POA: Diagnosis not present

## 2019-01-17 DIAGNOSIS — Z0000 Encounter for general adult medical examination without abnormal findings: Secondary | ICD-10-CM | POA: Diagnosis not present

## 2019-01-17 DIAGNOSIS — E7800 Pure hypercholesterolemia, unspecified: Secondary | ICD-10-CM | POA: Diagnosis not present

## 2019-01-17 DIAGNOSIS — I10 Essential (primary) hypertension: Secondary | ICD-10-CM | POA: Diagnosis not present

## 2019-01-22 ENCOUNTER — Other Ambulatory Visit: Payer: Self-pay

## 2019-01-22 ENCOUNTER — Ambulatory Visit (INDEPENDENT_AMBULATORY_CARE_PROVIDER_SITE_OTHER): Payer: PPO

## 2019-01-22 ENCOUNTER — Encounter: Payer: Self-pay | Admitting: Cardiology

## 2019-01-22 ENCOUNTER — Ambulatory Visit (INDEPENDENT_AMBULATORY_CARE_PROVIDER_SITE_OTHER): Payer: PPO | Admitting: Cardiology

## 2019-01-22 VITALS — BP 114/68 | HR 66 | Temp 96.0°F | Ht 71.0 in | Wt 168.0 lb

## 2019-01-22 DIAGNOSIS — I5022 Chronic systolic (congestive) heart failure: Secondary | ICD-10-CM

## 2019-01-22 MED ORDER — ISOSORB DINITRATE-HYDRALAZINE 20-37.5 MG PO TABS: 0.5000 | ORAL_TABLET | Freq: Two times a day (BID) | ORAL | 2 refills | Status: DC

## 2019-01-22 MED ORDER — POTASSIUM CHLORIDE CRYS ER 20 MEQ PO TBCR: 40.0000 meq | EXTENDED_RELEASE_TABLET | Freq: Every day | ORAL | 2 refills | Status: DC

## 2019-01-22 NOTE — Progress Notes (Signed)
Telephone visit note  Subjective:   Mark Baird., male    DOB: March 17, 1946, 73 y.o.   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  At last visit two weeks ago, I had held his carvedilol and Entresto at last visit given his low blood pressure, as well renal dysfunction thought to be of prerenal nature. I continued torsemide 20 mg once daily, and Kdur 40 mEq  Once daily.  His blood pressure is improved. Leg swelling is slightly improved.    Past Medical History:  Diagnosis Date  . Multiple myeloma ([place])   . Solitary kidney       Past Surgical History:  Procedure Laterality Date  . RIGHT/LEFT HEART CATH AND CORONARY ANGIOGRAPHY N/A 09/11/2017   Procedure: RIGHT/LEFT HEART CATH AND CORONARY ANGIOGRAPHY;  Surgeon: Nigel Mormon, MD;  Location: [place] [facility];  Service: Cardiovascular;  Laterality: N/A;      Social History   Socioeconomic History  . Marital status: Single    Spouse name: Not on file  . Number of children: 0  . Years of education: Not on file  . Highest education level: Not on file  Occupational History  . Not on file  Social Needs  . Financial resource strain: Not on file  . Food insecurity:    Worry: Not on file    Inability: Not on file  . Transportation needs:    Medical: Not on file    Non-medical: Not on file  Tobacco Use  . Smoking status: Former Smoker    Packs/day: 1.00    Types: Cigarettes  . Smokeless tobacco: Never Used  Substance and Sexual Activity  . Alcohol use: No  . Drug use: Not on file  . Sexual activity: Not on file  Lifestyle  . Physical activity:    Days per week: Not on file    Minutes per session: Not on file  . Stress: Not on file  Relationships  . Social connections:    Talks on phone: Not on file    Gets together:  Not on file    Attends religious service: Not on file    Active member of club or organization: Not on file    Attends meetings of clubs or organizations: Not on file    Relationship status: Not on file  . Intimate partner violence:    Fear of current or ex partner: Not on file    Emotionally abused: Not on file    Physically abused: Not on file    Forced sexual activity: Not on file  Other Topics Concern  . Not on file  Social History Narrative  . Not on file      Current Outpatient Medications on File Prior to Visit  Medication Sig Dispense Refill  . acetaminophen-codeine (TYLENOL #3) 300-30 MG tablet Take 1 tablet by mouth every 4 (four) hours as needed for moderate pain.    Marland Kitchen colchicine 0.6 MG tablet Take 0.6 mg by mouth daily.    . carvedilol (COREG) 12.5 MG tablet TAKE 1 TABLET BY MOUTH TWICE DAILY (Patient not taking: Reported on 12/31/2018) 180 tablet 1  . pravastatin (PRAVACHOL) 20 MG tablet Take 20 mg by mouth daily.     No current facility-administered medications on file prior to visit.     Cardiovascular studies:    Echocardiogram 01/22/2019: Severely  depressed LV systolic function with EF 20%. Left ventricle cavity is moderately dilated. Normal left ventricular wall thickness. Normal global wall motion. Doppler evidence of grade I (impaired) diastolic dysfunction, normal LAP. Calculated EF 20%. Left atrial cavity is moderately dilated at 4.7 cm. Moderate (Grade II) mitral regurgitation. Mild mitral valve leaflet thickening. IVC is dilated with respiratory variation. Estimated RA pressure 8 mmHg. Pulmonary hypertension seen on echocardiogram on 04/13/2017 not well appreciated on this study. No other significant change.      EKG 01/11/2019: Sinus rhythm  First degree AV block Left bundle branch block.   R/LHC 09/11/2017: No angiographically significant coronary artery disease Well compensated nonischemic cardiomyopathy  Echocardiogram 09/14/2017: 1. Left  ventricle cavity is normal in size. Moderate concentric hypertrophy of the left ventricle. Visual EF is 25-30%. Abnormal septal wall motion due to left bundle branch block. All other walls are moderate to severely hypokinetic. Doppler evidence of grade III (restrictive) diastolic dysfunction. Calculated EF 22%. 2. Left atrial cavity is normal in size. 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 3. Mild (Grade I) aortic regurgitation. 4. Mild (Grade I) mitral regurgitation. 5. Mild tricuspid regurgitation. No evidence of pulmonary hypertension.  Recent labs: 12/18/2018: Results for Mark Baird (MRN [id number]) as of 01/08/2019 21:53  Ref. Range 01/04/2019 98:26  BASIC METABOLIC PANEL Unknown Rpt (A)  Sodium Latest Ref Range: 134 - 144 mmol/L 140  Potassium Latest Ref Range: 3.5 - 5.2 mmol/L 3.5  Chloride Latest Ref Range: 96 - 106 mmol/L 99  CO2 Latest Ref Range: 20 - 29 mmol/L 26  Glucose Latest Ref Range: 65 - 99 mg/dL 111 (H)  BUN Latest Ref Range: 8 - 27 mg/dL 23  Creatinine Latest Ref Range: 0.76 - 1.27 mg/dL 1.67 (H)  Calcium Latest Ref Range: 8.6 - 10.2 mg/dL 8.7  BUN/Creatinine Ratio Latest Ref Range: 10 - 24  14  GFR, Est Non African American Latest Ref Range: >59 mL/min/1.73 40 (L)  GFR, Est African American Latest Ref Range: >59 mL/min/1.73 46 (L)    Review of Systems  Constitution: Negative for decreased appetite, malaise/fatigue, weight gain and weight loss.  HENT: Negative for congestion.   Eyes: Negative for visual disturbance.  Cardiovascular: Positive for dyspnea on exertion and leg swelling. Negative for chest pain, palpitations and syncope.  Respiratory: Positive for shortness of breath.   Endocrine: Negative for cold intolerance.  Hematologic/Lymphatic: Does not bruise/bleed easily.  Skin: Negative for itching and rash.  Musculoskeletal: Negative for myalgias.  Gastrointestinal: Negative for abdominal pain, nausea  and vomiting.  Genitourinary: Negative for dysuria.  Neurological: Negative for dizziness and weakness.  Psychiatric/Behavioral: The patient is not nervous/anxious.   All other systems reviewed and are negative.        Vitals:   01/22/19 1540  BP: 114/68  Pulse: 66  Temp: (!) 96 F (35.6 C)  SpO2: 99%   Physical Exam  Constitutional: He is oriented to person, place, and time. He appears well-developed and well-nourished. No distress.  HENT:  Head: Normocephalic and atraumatic.  Eyes: Pupils are equal, round, and reactive to light. Conjunctivae are normal.  Neck: No JVD present.  Cardiovascular: Normal rate, regular rhythm and intact distal pulses.  Pulmonary/Chest: Effort normal and breath sounds normal. He has no wheezes. He has no rales.  Abdominal: Soft. Bowel sounds are normal. There is no rebound.  Musculoskeletal:        General: Edema (b/l 1 +) present.  Lymphadenopathy:  He has no cervical adenopathy.  Neurological: He is alert and oriented to person, place, and time. No cranial nerve deficit.  Skin: Skin is warm and dry.  Psychiatric: He has a normal mood and affect.  Nursing note and vitals reviewed.          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  HFrEF: NYHA class II-III. Nonischemic cardiomyopathy. Echocardiogram findings from earlier today discussed with the patient. EF remains low around 20-25%.  Blood pressure improved compared to previous visit. I am doing to attempt re initiating some guideline directed heart failure therapy. Given his tenuous renal function and solitary kidney, I will try Bidil ahead of ARNI/ACE/ARB. Started Bidil 1/2 tab of 20-37./5 mg bid. Re emphasized importance of salt restriction diet.Refilled torsemide and potassium.   Given his comorbidities and poor  baseline functional status, I do not think he will be a candidate for advanced heart failure therapy. Also, QRS is just about 130 msec. Less likely to benefit from CRT-D.  CKD3: Continue follow up with nephrology  Anemia: Likely due to CKD. Iron is repleted. Follow up with nephrology.  Nigel Mormon, MD [facility]. [place] Pager: [phone] Office: [phone] If no answer Cell [phone]

## 2019-01-24 DIAGNOSIS — M79604 Pain in right leg: Secondary | ICD-10-CM | POA: Diagnosis not present

## 2019-01-24 DIAGNOSIS — R0989 Other specified symptoms and signs involving the circulatory and respiratory systems: Secondary | ICD-10-CM | POA: Diagnosis not present

## 2019-01-24 DIAGNOSIS — Z136 Encounter for screening for cardiovascular disorders: Secondary | ICD-10-CM | POA: Diagnosis not present

## 2019-01-24 DIAGNOSIS — M79605 Pain in left leg: Secondary | ICD-10-CM | POA: Diagnosis not present

## 2019-01-29 DIAGNOSIS — I82412 Acute embolism and thrombosis of left femoral vein: Secondary | ICD-10-CM | POA: Diagnosis not present

## 2019-01-29 DIAGNOSIS — Z7901 Long term (current) use of anticoagulants: Secondary | ICD-10-CM | POA: Diagnosis not present

## 2019-01-29 DIAGNOSIS — E7800 Pure hypercholesterolemia, unspecified: Secondary | ICD-10-CM | POA: Diagnosis not present

## 2019-01-29 DIAGNOSIS — I10 Essential (primary) hypertension: Secondary | ICD-10-CM | POA: Diagnosis not present

## 2019-01-29 DIAGNOSIS — M109 Gout, unspecified: Secondary | ICD-10-CM | POA: Diagnosis not present

## 2019-01-30 ENCOUNTER — Other Ambulatory Visit: Payer: Self-pay

## 2019-01-30 NOTE — Patient Outreach (Signed)
[facility]) Care Management  01/30/2019  Marnee Guarneri. [birth year] [id number]  Late entry. Case Closed on 01-11-2019 do to 3 unsuccessful outreaches. MD case closure letter sent. No other disciplines on Care Team.  [name], [place] Management  [facility] Management Coordinator  [phone] [email or web address]

## 2019-01-31 ENCOUNTER — Encounter: Payer: Self-pay | Admitting: Cardiology

## 2019-01-31 ENCOUNTER — Ambulatory Visit (INDEPENDENT_AMBULATORY_CARE_PROVIDER_SITE_OTHER): Payer: PPO | Admitting: Cardiology

## 2019-01-31 ENCOUNTER — Other Ambulatory Visit: Payer: Self-pay

## 2019-01-31 VITALS — BP 93/53 | HR 74 | Ht 71.0 in | Wt 172.4 lb

## 2019-01-31 DIAGNOSIS — I129 Hypertensive chronic kidney disease with stage 1 through stage 4 chronic kidney disease, or unspecified chronic kidney disease: Secondary | ICD-10-CM | POA: Diagnosis not present

## 2019-01-31 DIAGNOSIS — N183 Chronic kidney disease, stage 3 unspecified: Secondary | ICD-10-CM

## 2019-01-31 DIAGNOSIS — I5022 Chronic systolic (congestive) heart failure: Secondary | ICD-10-CM | POA: Diagnosis not present

## 2019-01-31 MED ORDER — TORSEMIDE 20 MG PO TABS: 20.0000 mg | ORAL_TABLET | Freq: Every day | ORAL | 2 refills | Status: DC

## 2019-01-31 MED ORDER — ISOSORB DINITRATE-HYDRALAZINE 20-37.5 MG PO TABS: 0.5000 | ORAL_TABLET | Freq: Two times a day (BID) | ORAL | 2 refills | Status: DC

## 2019-01-31 MED ORDER — POTASSIUM CHLORIDE CRYS ER 20 MEQ PO TBCR: 20.0000 meq | EXTENDED_RELEASE_TABLET | Freq: Every day | ORAL | 2 refills | Status: DC

## 2019-01-31 NOTE — Patient Instructions (Signed)
Heart Failure Eating Plan °Heart failure, also called congestive heart failure, occurs when your heart does not pump blood well enough to meet your body's needs for oxygen-rich blood. Heart failure is a long-term (chronic) condition. Living with heart failure can be challenging. However, following your health care provider's instructions about a healthy lifestyle and working with a diet and nutrition specialist (dietitian) to choose the right foods may help to improve your symptoms. °What are tips for following this plan? °General guidelines °· Do not eat more than 2,300 mg of salt (sodium) a day. The amount of sodium that is recommended for you may be lower, depending on your condition. °· Maintain a healthy body weight as directed. Ask your health care provider what a healthy weight is for you. °? Check your weight every day. °? Work with your health care provider and dietitian to make a plan that is right for you to lose weight or maintain your current weight. °· Limit how much fluid you drink. Ask your health care provider or dietitian how much fluid you can have each day. °· Limit or avoid alcohol as told by your health care provider or dietitian. °Reading food labels °· Check food labels for the amount of sodium per serving. Choose foods that have less than 140 mg (milligrams) of sodium in each serving. °· Check food labels for the number of calories per serving. This is important if you need to limit your daily calorie intake to lose weight. °· Check food labels for the serving size. If you eat more than one serving, you will be eating more sodium and calories than what is listed on the label. °· Look for foods that are labeled as "sodium-free," "very low sodium," or "low sodium." °? Foods labeled as "reduced sodium" or "lightly salted" may still have more sodium than what is recommended for you. °Cooking °· Avoid adding salt when cooking. Ask your health care provider or dietitian before using salt  substitutes. °· Season food with salt-free seasonings, spices, or herbs. Check the label of seasoning mixes to make sure they do not contain salt. °· Cook with heart-healthy oils, such as olive, canola, soybean, or sunflower oil. °· Do not fry foods. Cook foods using low-fat methods, such as baking, boiling, grilling, and broiling. °· Limit unhealthy fats when cooking by: °? Removing the skin from poultry, such as chicken. °? Removing all visible fats from meats. °? Skimming the fat off from stews, soups, and gravies before serving them. °Meal planning ° °· Limit your intake of: °? Processed, canned, or pre-packaged foods. °? Foods that are high in trans fat, such as fried foods. °? Sweets, desserts, sugary drinks, and other foods with added sugar. °? Full-fat dairy products, such as whole milk. °· Eat a balanced diet that includes: °? 4-5 servings of fruit each day and 4-5 servings of vegetables each day. At each meal, try to fill half of your plate with fruits and vegetables. °? Up to 6-8 servings of whole grains each day. °? Up to 2 servings of lean meat, poultry, or fish each day. One serving of meat is equal to 3 oz. This is about the same size as a deck of cards. °? 2 servings of low-fat dairy each day. °? Heart-healthy fats. Healthy fats called omega-3 fatty acids are found in foods such as flaxseed and cold-water fish like sardines, salmon, and mackerel. °· Aim to eat 25-35 g (grams) of fiber a day. Foods that are high in fiber include   apples, broccoli, carrots, beans, peas, and whole grains. °· Do not add salt or condiments that contain salt (such as soy sauce) to foods before eating. °· When eating at a restaurant, ask that your food be prepared with less salt or no salt, if possible. °· Try to eat 2 or more vegetarian meals each week. °· Eat more home-cooked food and eat less restaurant, buffet, and fast food. °Recommended foods °The items listed may not be a complete list. Talk with your dietitian about  what dietary choices are best for you. °Grains °Bread with less than 80 mg of sodium per slice. Whole-wheat pasta, quinoa, and brown rice. Oats and oatmeal. Barley. Millet. Grits and cream of wheat. Whole-grain and whole-wheat cold cereal. °Vegetables °All fresh vegetables. Vegetables that are frozen without sauce or added salt. Low-sodium or sodium-free canned vegetables. °Fruits °All fresh, frozen, and canned fruits. Dried fruits, such as raisins, prunes, and cranberries. °Meats and other protein foods °Lean cuts of meat. Skinless chicken and turkey. Fish with high omega-3 fatty acids, such as salmon, sardines, and other cold-water fishes. Eggs. Dried beans, peas, and edamame. Unsalted nuts and nut butters. °Dairy °Low-fat or nonfat (skim) milk and dried milk. Rice milk, soy milk, and almond milk. Low-fat or nonfat yogurt. Small amounts of reduced-sodium block cheese. Low-sodium cottage cheese. °Fats and oils °Olive, canola, soybean, flaxseed, or sunflower oil. Avocado. °Sweets and desserts °Apple sauce. Granola bars. Sugar-free pudding and gelatin. Frozen fruit bars. °Seasoning and other foods °Fresh and dried herbs. Lemon or lime juice. Vinegar. Low-sodium ketchup. Salt-free marinades, salad dressings, sauces, and seasonings. °Foods to avoid °The items listed may not be a complete list. Talk with your dietitian about what dietary choices are best for you. °Grains °Bread with more than 80 mg of sodium per slice. Hot or cold cereal with more than 140 mg sodium per serving. Salted pretzels and crackers. Pre-packaged breadcrumbs. Bagels, croissants, and biscuits. °Vegetables °Canned vegetables. Frozen vegetables with sauce or seasonings. Creamed vegetables. French fries. Onion rings. Pickled vegetables and sauerkraut. °Fruits °Fruits that are dried with sodium-containing preservatives. °Meats and other protein foods °Ribs and chicken wings. Bacon, ham, pepperoni, bologna, salami, and packaged luncheon meats. Hot  dogs, bratwurst, and sausage. Canned meat. Smoked meat and fish. Salted nuts and seeds. °Dairy °Whole milk, half-and-half, and cream. Buttermilk. Processed cheese, cheese spreads, and cheese curds. Regular cottage cheese. Feta cheese. Shredded cheese. String cheese. °Fats and oils °Butter, lard, shortening, ghee, and bacon fat. Canned and packaged gravies. °Seasoning and other foods °Onion salt, garlic salt, table salt, and sea salt. Marinades. Regular salad dressings. Relishes, pickles, and olives. Meat flavorings and tenderizers, and bouillon cubes. Horseradish, ketchup, and mustard. Worcestershire sauce. Teriyaki sauce, soy sauce (including reduced sodium). Hot sauce and Tabasco sauce. Steak sauce, fish sauce, oyster sauce, and cocktail sauce. Taco seasonings. Barbecue sauce. Tartar sauce. °Summary °· A heart failure eating plan includes changes that limit your intake of sodium and unhealthy fat, and it may help you lose weight or maintain a healthy weight. Your health care provider may also recommend limiting how much fluid you drink. °· Most people with heart failure should eat no more than 2,300 mg of salt (sodium) a day. The amount of sodium that is recommended for you may be lower, depending on your condition. °· Contact your health care provider or dietitian before making any major changes to your diet. °This information is not intended to replace advice given to you by your health care provider. Make sure   you discuss any questions you have with your health care provider. °Document Released: 01/06/2017 Document Revised: 01/06/2017 Document Reviewed: 01/06/2017 °Elsevier Interactive Patient Education © 2019 [employer] Inc. ° °

## 2019-01-31 NOTE — Progress Notes (Addendum)
Telephone visit note  Subjective:   Mark Guarneri., male    DOB: March 20, 1946, 73 y.o.   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  At last visit two weeks ago, his blood pressure improved compared to previous visit. I am doing to attempt re initiating some guideline directed heart failure therapy. Given his tenuous renal function and solitary kidney, I decided to try Bidil ahead of ARNI/ACE/ARB. Started Bidil 1/2 tab of 20-37./5 mg bid.  He is tolerating very low dose Bidil without lightheadedness, although his blood pressure remains low. He tells me that he recently underwent testing through his PCP and was found to have blood clots in his left leg, and was stated on warfarin.   Past Medical History:  Diagnosis Date  . Multiple myeloma ([place])   . Solitary kidney       Past Surgical History:  Procedure Laterality Date  . RIGHT/LEFT HEART CATH AND CORONARY ANGIOGRAPHY N/A 09/11/2017   Procedure: RIGHT/LEFT HEART CATH AND CORONARY ANGIOGRAPHY;  Surgeon: Nigel Mormon, MD;  Location: [place] [facility];  Service: Cardiovascular;  Laterality: N/A;      Social History   Socioeconomic History  . Marital status: Single    Spouse name: Not on file  . Number of children: 0  . Years of education: Not on file  . Highest education level: Not on file  Occupational History  . Not on file  Social Needs  . Financial resource strain: Not on file  . Food insecurity:    Worry: Not on file    Inability: Not on file  . Transportation needs:    Medical: Not on file    Non-medical: Not on file  Tobacco Use  . Smoking status: Former Smoker    Packs/day: 1.00    Types: Cigarettes  . Smokeless tobacco: Never Used  Substance and Sexual Activity  . Alcohol use: No  . Drug use: Not on file  .  Sexual activity: Not on file  Lifestyle  . Physical activity:    Days per week: Not on file    Minutes per session: Not on file  . Stress: Not on file  Relationships  . Social connections:    Talks on phone: Not on file    Gets together: Not on file    Attends religious service: Not on file    Active member of club or organization: Not on file    Attends meetings of clubs or organizations: Not on file    Relationship status: Not on file  . Intimate partner violence:    Fear of current or ex partner: Not on file    Emotionally abused: Not on file    Physically abused: Not on file    Forced sexual activity: Not on file  Other Topics Concern  . Not on file  Social History Narrative  . Not on file      Current Outpatient Medications on File Prior to Visit  Medication Sig Dispense Refill  . acetaminophen-codeine (TYLENOL #3) 300-30 MG tablet Take 1 tablet by mouth every 4 (four) hours as needed for moderate pain.    Marland Kitchen colchicine 0.6 MG tablet Take 0.6 mg by mouth daily.    . carvedilol (COREG) 12.5 MG tablet TAKE 1 TABLET BY MOUTH TWICE DAILY (Patient not taking: Reported on 12/31/2018) 180 tablet 1  .  pravastatin (PRAVACHOL) 20 MG tablet Take 20 mg by mouth daily.     No current facility-administered medications on file prior to visit.     Cardiovascular studies:    Echocardiogram 01/22/2019: Severely depressed LV systolic function with EF 20%. Left ventricle cavity is moderately dilated. Normal left ventricular wall thickness. Normal global wall motion. Doppler evidence of grade I (impaired) diastolic dysfunction, normal LAP. Calculated EF 20%. Left atrial cavity is moderately dilated at 4.7 cm. Moderate (Grade II) mitral regurgitation. Mild mitral valve leaflet thickening. IVC is dilated with respiratory variation. Estimated RA pressure 8 mmHg. Pulmonary hypertension seen on echocardiogram on 04/13/2017 not well appreciated on this study. No other significant change.       EKG 01/11/2019: Sinus rhythm  First degree AV block Left bundle branch block.   R/LHC 09/11/2017: No angiographically significant coronary artery disease Well compensated nonischemic cardiomyopathy  Echocardiogram 09/14/2017: 1. Left ventricle cavity is normal in size. Moderate concentric hypertrophy of the left ventricle. Visual EF is 25-30%. Abnormal septal wall motion due to left bundle branch block. All other walls are moderate to severely hypokinetic. Doppler evidence of grade III (restrictive) diastolic dysfunction. Calculated EF 22%. 2. Left atrial cavity is normal in size. 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 3. Mild (Grade I) aortic regurgitation. 4. Mild (Grade I) mitral regurgitation. 5. Mild tricuspid regurgitation. No evidence of pulmonary hypertension.  Recent labs: 12/18/2018: Results for VIET, KEMMERER (MRN [id number]) as of 01/08/2019 21:53  Ref. Range 01/04/2019 40:98  BASIC METABOLIC PANEL Unknown Rpt (A)  Sodium Latest Ref Range: 134 - 144 mmol/L 140  Potassium Latest Ref Range: 3.5 - 5.2 mmol/L 3.5  Chloride Latest Ref Range: 96 - 106 mmol/L 99  CO2 Latest Ref Range: 20 - 29 mmol/L 26  Glucose Latest Ref Range: 65 - 99 mg/dL 111 (H)  BUN Latest Ref Range: 8 - 27 mg/dL 23  Creatinine Latest Ref Range: 0.76 - 1.27 mg/dL 1.67 (H)  Calcium Latest Ref Range: 8.6 - 10.2 mg/dL 8.7  BUN/Creatinine Ratio Latest Ref Range: 10 - 24  14  GFR, Est Non African American Latest Ref Range: >59 mL/min/1.73 40 (L)  GFR, Est African American Latest Ref Range: >59 mL/min/1.73 46 (L)    Review of Systems  Constitution: Negative for decreased appetite, malaise/fatigue, weight gain and weight loss.  HENT: Negative for congestion.   Eyes: Negative for visual disturbance.  Cardiovascular: Positive for dyspnea on exertion and leg swelling. Negative for chest pain, palpitations and syncope.  Respiratory: Positive for  shortness of breath.   Endocrine: Negative for cold intolerance.  Hematologic/Lymphatic: Does not bruise/bleed easily.  Skin: Negative for itching and rash.  Musculoskeletal: Negative for myalgias.  Gastrointestinal: Negative for abdominal pain, nausea and vomiting.  Genitourinary: Negative for dysuria.  Neurological: Negative for dizziness and weakness.  Psychiatric/Behavioral: The patient is not nervous/anxious.   All other systems reviewed and are negative.        Vitals:   01/31/19 1337  BP: (!) 93/53  Pulse: 74  SpO2: 95%   Physical Exam  Constitutional: He is oriented to person, place, and time. He appears well-developed and well-nourished. No distress.  HENT:  Head: Normocephalic and atraumatic.  Eyes: Pupils are equal, round, and reactive to light. Conjunctivae are normal.  Neck: No JVD present.  Cardiovascular: Normal rate, regular rhythm and intact distal pulses.  Pulmonary/Chest: Effort normal and breath sounds normal. He has no wheezes. He has  no rales.  Abdominal: Soft. Bowel sounds are normal. There is no rebound.  Musculoskeletal:        General: Edema (b/l 1 +) present.  Lymphadenopathy:    He has no cervical adenopathy.  Neurological: He is alert and oriented to person, place, and time. No cranial nerve deficit.  Skin: Skin is warm and dry.  Psychiatric: He has a normal mood and affect.  Nursing note and vitals reviewed.          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   HFrEF: NYHA class II-III. Nonischemic cardiomyopathy. EF 20-25. He is unable to tolerate guideline directed medical therapy due to low blood pressure, likely autonomic dysfunction. Amyloidosis is in the differential. I will obtain PYP study, and hATTR gentic testing. Will also obtain urine and serum  immunofixation. Continue bidil 1/2 tab bid, torsemide 20 mg daily, and Kdur 20 mEq daily. If workup negative for amyloidosis, will refer to EP for CRT-D  CKD3: Continue follow up with nephrology  Anemia: Likely due to CKD. Iron is repleted. Follow up with nephrology.  ?DVT: Now on warfarin. F/u w/PCP.  Nigel Mormon, MD [facility] [place] Pager: [phone] Office: [phone] If no answer Cell [phone]

## 2019-01-31 NOTE — Addendum Note (Signed)
Addended by: Nigel Mormon on: 01/31/2019 03:02 PM   Modules accepted: Orders

## 2019-02-21 ENCOUNTER — Encounter: Payer: Self-pay | Admitting: Cardiology

## 2019-02-21 ENCOUNTER — Other Ambulatory Visit: Payer: Self-pay

## 2019-02-21 ENCOUNTER — Ambulatory Visit (INDEPENDENT_AMBULATORY_CARE_PROVIDER_SITE_OTHER): Payer: PPO | Admitting: Cardiology

## 2019-02-21 VITALS — Ht 71.0 in | Wt 169.0 lb

## 2019-02-21 DIAGNOSIS — I5022 Chronic systolic (congestive) heart failure: Secondary | ICD-10-CM

## 2019-02-21 NOTE — Progress Notes (Signed)
   Telephone visit note  Subjective:   Marnee Guarneri., male    DOB: [birth year], 73 y.o.   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  Pending workup for amyloidosis, including serum and urine immunofixation, PYP scan, and genetic testing. Genetic testing previously performed in our office rejected due to equipment issues. Will need to repeat this.  Follow up in office visit after the above tests, in 3 weeks.  Time spent: 11 min  [place], MD [facility] [place] Pager: [phone] Office: [phone] If no answer Cell [phone]

## 2019-02-27 DIAGNOSIS — I5022 Chronic systolic (congestive) heart failure: Secondary | ICD-10-CM | POA: Diagnosis not present

## 2019-02-27 DIAGNOSIS — N183 Chronic kidney disease, stage 3 (moderate): Secondary | ICD-10-CM | POA: Diagnosis not present

## 2019-02-28 DIAGNOSIS — Z7901 Long term (current) use of anticoagulants: Secondary | ICD-10-CM | POA: Diagnosis not present

## 2019-02-28 DIAGNOSIS — E559 Vitamin D deficiency, unspecified: Secondary | ICD-10-CM | POA: Diagnosis not present

## 2019-02-28 DIAGNOSIS — I10 Essential (primary) hypertension: Secondary | ICD-10-CM | POA: Diagnosis not present

## 2019-02-28 DIAGNOSIS — I82412 Acute embolism and thrombosis of left femoral vein: Secondary | ICD-10-CM | POA: Diagnosis not present

## 2019-02-28 DIAGNOSIS — R7303 Prediabetes: Secondary | ICD-10-CM | POA: Diagnosis not present

## 2019-02-28 LAB — IMMUNOFIXATION, SERUM
IgA/Immunoglobulin A, Serum: 1896 mg/dL — ABNORMAL HIGH (ref 61–437)
IgG (Immunoglobin G), Serum: 866 mg/dL (ref 603–1613)
IgM (Immunoglobulin M), Srm: 22 mg/dL (ref 15–143)

## 2019-02-28 LAB — IMMUNOFIXATION, URINE

## 2019-03-06 NOTE — Progress Notes (Signed)
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   I don't see PYP study scheduled looking for cardiac amyloidosis. Okay to cancel this.   I would like to see him in 04/2019 for f/u  Thanks MJP

## 2019-03-20 DIAGNOSIS — I129 Hypertensive chronic kidney disease with stage 1 through stage 4 chronic kidney disease, or unspecified chronic kidney disease: Secondary | ICD-10-CM | POA: Diagnosis not present

## 2019-03-20 DIAGNOSIS — Z8579 Personal history of other malignant neoplasms of lymphoid, hematopoietic and related tissues: Secondary | ICD-10-CM | POA: Diagnosis not present

## 2019-03-20 DIAGNOSIS — I509 Heart failure, unspecified: Secondary | ICD-10-CM | POA: Diagnosis not present

## 2019-03-20 DIAGNOSIS — N183 Chronic kidney disease, stage 3 (moderate): Secondary | ICD-10-CM | POA: Diagnosis not present

## 2019-04-21 NOTE — Progress Notes (Signed)
Subjective:   Mark Guarneri., male    DOB: 06-Feb-1946, 73 y.o.   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  PYP scan was ordered, but it does not appear that he had the test. Immune electrophoresis showed a faint band in IgA; light chain specificity could not be verified. Retesting in 4-6 months was recommended.  Symptomatically, patient is doing remarkably better compared to his last visit.  His leg swelling has nearly resolved.  He is able to walk more without significant shortness of breath.  He tells me that his nephrologist recently stopped his potassium.    Past Medical History:  Diagnosis Date  . Multiple myeloma ([place])   . Solitary kidney       Past Surgical History:  Procedure Laterality Date  . RIGHT/LEFT HEART CATH AND CORONARY ANGIOGRAPHY N/A 09/11/2017   Procedure: RIGHT/LEFT HEART CATH AND CORONARY ANGIOGRAPHY;  Surgeon: Nigel Mormon, MD;  Location: [facility];  Service: Cardiovascular;  Laterality: N/A;      Social History   Socioeconomic History  . Marital status: Single    Spouse name: Not on file  . Number of children: 0  . Years of education: Not on file  . Highest education level: Not on file  Occupational History  . Not on file  Social Needs  . Financial resource strain: Not on file  . Food insecurity    Worry: Not on file    Inability: Not on file  . Transportation needs    Medical: Not on file    Non-medical: Not on file  Tobacco Use  . Smoking status: Former Smoker    Packs/day: 1.00    Years: 30.00    Pack years: 30.00    Types: Cigarettes    Quit date: 02/20/1989    Years since quitting: 30.1  . Smokeless tobacco: Never Used  Substance and Sexual Activity  . Alcohol use: No  . Drug use: Not on file  . Sexual  activity: Not on file  Lifestyle  . Physical activity    Days per week: Not on file    Minutes per session: Not on file  . Stress: Not on file  Relationships  . Social Herbalist on phone: Not on file    Gets together: Not on file    Attends religious service: Not on file    Active member of club or organization: Not on file    Attends meetings of clubs or organizations: Not on file    Relationship status: Not on file  . Intimate partner violence    Fear of current or ex partner: Not on file    Emotionally abused: Not on file    Physically abused: Not on file    Forced sexual activity: Not on file  Other Topics Concern  . Not on file  Social History Narrative  . Not on file      Current Outpatient Medications on File Prior to Visit  Medication Sig Dispense Refill  . acetaminophen-codeine (TYLENOL #3) 300-30 MG tablet Take 1 tablet by mouth every 4 (four) hours as needed for moderate pain.    Marland Kitchen colchicine 0.6 MG tablet Take 0.6 mg by mouth daily.    . carvedilol (COREG) 12.5 MG tablet TAKE 1 TABLET BY MOUTH TWICE DAILY (Patient not taking:  Reported on 12/31/2018) 180 tablet 1  . pravastatin (PRAVACHOL) 20 MG tablet Take 20 mg by mouth daily.     No current facility-administered medications on file prior to visit.     Cardiovascular studies:  Echocardiogram 01/22/2019: Severely depressed LV systolic function with EF 20%. Left ventricle cavity is moderately dilated. Normal left ventricular wall thickness. Normal global wall motion. Doppler evidence of grade I (impaired) diastolic dysfunction, normal LAP. Calculated EF 20%. Left atrial cavity is moderately dilated at 4.7 cm. Moderate (Grade II) mitral regurgitation. Mild mitral valve leaflet thickening. IVC is dilated with respiratory variation. Estimated RA pressure 8 mmHg. Pulmonary hypertension seen on echocardiogram on 04/13/2017 not well appreciated on this study. No other significant change.    EKG  01/11/2019: Sinus rhythm  First degree AV block Left bundle branch block.   R/LHC 09/11/2017: No angiographically significant coronary artery disease Well compensated nonischemic cardiomyopathy  Echocardiogram 09/14/2017: 1. Left ventricle cavity is normal in size. Moderate concentric hypertrophy of the left ventricle. Visual EF is 25-30%. Abnormal septal wall motion due to left bundle branch block. All other walls are moderate to severely hypokinetic. Doppler evidence of grade III (restrictive) diastolic dysfunction. Calculated EF 22%. 2. Left atrial cavity is normal in size. 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 3. Mild (Grade I) aortic regurgitation. 4. Mild (Grade I) mitral regurgitation. 5. Mild tricuspid regurgitation. No evidence of pulmonary hypertension.  Recent labs: 12/18/2018: Results for DAMEIR, GENTZLER (MRN [id number]) as of 01/08/2019 21:53  Ref. Range 01/04/2019 09:98  BASIC METABOLIC PANEL Unknown Rpt (A)  Sodium Latest Ref Range: 134 - 144 mmol/L 140  Potassium Latest Ref Range: 3.5 - 5.2 mmol/L 3.5  Chloride Latest Ref Range: 96 - 106 mmol/L 99  CO2 Latest Ref Range: 20 - 29 mmol/L 26  Glucose Latest Ref Range: 65 - 99 mg/dL 111 (H)  BUN Latest Ref Range: 8 - 27 mg/dL 23  Creatinine Latest Ref Range: 0.76 - 1.27 mg/dL 1.67 (H)  Calcium Latest Ref Range: 8.6 - 10.2 mg/dL 8.7  BUN/Creatinine Ratio Latest Ref Range: 10 - 24  14  GFR, Est Non African American Latest Ref Range: >59 mL/min/1.73 40 (L)  GFR, Est African American Latest Ref Range: >59 mL/min/1.73 46 (L)    Review of Systems  Constitution: Negative for decreased appetite, malaise/fatigue, weight gain and weight loss.  HENT: Negative for congestion.   Eyes: Negative for visual disturbance.  Cardiovascular: Positive for dyspnea on exertion (Improved). Negative for chest pain, leg swelling, palpitations and syncope.  Respiratory: Positive for shortness  of breath (Improved).   Endocrine: Negative for cold intolerance.  Hematologic/Lymphatic: Does not bruise/bleed easily.  Skin: Negative for itching and rash.  Musculoskeletal: Negative for myalgias.  Gastrointestinal: Negative for abdominal pain, nausea and vomiting.  Genitourinary: Negative for dysuria.  Neurological: Negative for dizziness and weakness.  Psychiatric/Behavioral: The patient is not nervous/anxious.   All other systems reviewed and are negative.        Vitals:   04/22/19 1048  BP: 132/67  Pulse: 72  SpO2: 98%     Physical Exam  Constitutional: He is oriented to person, place, and time. He appears well-developed and well-nourished. No distress.  HENT:  Head: Normocephalic and atraumatic.  Eyes: Pupils are equal, round, and reactive to light. Conjunctivae are normal.  Neck: No JVD present.  Cardiovascular: Normal rate, regular rhythm and intact distal pulses.  Pulmonary/Chest: Effort normal and breath sounds normal.  He has no wheezes. He has no rales.  Abdominal: Soft. Bowel sounds are normal. There is no rebound.  Musculoskeletal:        General: Edema ( +Tace LLE) present.  Lymphadenopathy:    He has no cervical adenopathy.  Neurological: He is alert and oriented to person, place, and time. No cranial nerve deficit.  Skin: Skin is warm and dry.  Psychiatric: He has a normal mood and affect.  Nursing note and vitals reviewed.          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  HFrEF: NYHA class II-III. Nonischemic cardiomyopathy. Echocardiogram 01/2019-EF remains low around 20-25%.  Now that his blood pressure is improved, I will introduce beta-blocker in the form of metoprolol succinate 25 mg once daily. Given his tenuous renal function and solitary kidney, he is not on ARNI/ACE/ARB.  Started Continue Bidil 1/2 tab of 20-37./5 mg bid. Continue torsemide 20 mg daily.  Will obtain labs from nephrology.   H/o DVT: On warfarin, as per PCP  Elevated IgA: On immune electrophoresis performed looking for amyloidosis, he had  faint band in IgA; light chain specificity could not be verified. I will refer him to hematology for further evaluation.   CKD3: Continue follow up with nephrology  Anemia: Likely due to CKD. Iron is repleted. Follow up with nephrology.  F/u in 4 weeks  [place], MD [facility] Pager: [phone] Office: [phone] If no answer Cell [phone]

## 2019-04-22 ENCOUNTER — Ambulatory Visit: Payer: PPO | Admitting: Cardiology

## 2019-04-22 ENCOUNTER — Other Ambulatory Visit: Payer: Self-pay

## 2019-04-22 ENCOUNTER — Ambulatory Visit (INDEPENDENT_AMBULATORY_CARE_PROVIDER_SITE_OTHER): Payer: PPO | Admitting: Cardiology

## 2019-04-22 ENCOUNTER — Encounter: Payer: Self-pay | Admitting: Cardiology

## 2019-04-22 VITALS — BP 132/67 | HR 72 | Ht 71.0 in | Wt 183.0 lb

## 2019-04-22 DIAGNOSIS — I5022 Chronic systolic (congestive) heart failure: Secondary | ICD-10-CM | POA: Diagnosis not present

## 2019-04-22 DIAGNOSIS — N183 Chronic kidney disease, stage 3 unspecified: Secondary | ICD-10-CM

## 2019-04-22 MED ORDER — TORSEMIDE 20 MG PO TABS: 20.0000 mg | ORAL_TABLET | Freq: Every day | ORAL | 2 refills | Status: DC

## 2019-04-22 MED ORDER — METOPROLOL SUCCINATE ER 25 MG PO TB24: 25.0000 mg | ORAL_TABLET | Freq: Every day | ORAL | 3 refills | Status: DC

## 2019-04-22 MED ORDER — BIDIL 20-37.5 MG PO TABS: 0.5000 | ORAL_TABLET | Freq: Two times a day (BID) | ORAL | 2 refills | Status: DC

## 2019-04-24 ENCOUNTER — Telehealth: Payer: Self-pay

## 2019-04-24 NOTE — Telephone Encounter (Signed)
Mark Baird genetics called and stated that he was suppose to get a re blood draw 04/22/2019 and wanted a update because apparently the one odne in April the sample failed ?  [phone]

## 2019-04-24 NOTE — Telephone Encounter (Signed)
He hasnt had his appt yet. Not scheduled until 09/25

## 2019-05-07 ENCOUNTER — Other Ambulatory Visit: Payer: Self-pay

## 2019-05-07 MED ORDER — WARFARIN SODIUM 10 MG PO TABS: 10.0000 mg | ORAL_TABLET | Freq: Every day | ORAL | 0 refills | Status: DC

## 2019-05-31 ENCOUNTER — Encounter: Payer: Self-pay | Admitting: Cardiology

## 2019-05-31 ENCOUNTER — Other Ambulatory Visit: Payer: Self-pay

## 2019-05-31 ENCOUNTER — Ambulatory Visit (INDEPENDENT_AMBULATORY_CARE_PROVIDER_SITE_OTHER): Payer: PPO | Admitting: Cardiology

## 2019-05-31 VITALS — BP 134/68 | HR 76 | Temp 96.9°F | Ht 71.0 in | Wt 192.2 lb

## 2019-05-31 DIAGNOSIS — N183 Chronic kidney disease, stage 3 unspecified: Secondary | ICD-10-CM

## 2019-05-31 DIAGNOSIS — I129 Hypertensive chronic kidney disease with stage 1 through stage 4 chronic kidney disease, or unspecified chronic kidney disease: Secondary | ICD-10-CM | POA: Diagnosis not present

## 2019-05-31 DIAGNOSIS — I5022 Chronic systolic (congestive) heart failure: Secondary | ICD-10-CM

## 2019-05-31 MED ORDER — BIDIL 20-37.5 MG PO TABS: 1.0000 | ORAL_TABLET | Freq: Two times a day (BID) | ORAL | 3 refills | Status: DC

## 2019-05-31 NOTE — Progress Notes (Signed)
Subjective:   Mark Baird., male    DOB: September 22, 1945, 73 y.o.   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  PYP scan was ordered, but it does not appear that he had the test. Immune electrophoresis showed a faint band in IgA; light chain specificity could not be verified. Retesting in 4-6 months was recommended. I referred him to Hematology, but he is yet to see them.   Symptomatically, patient is doing well. He has minimal leg swelling. He is able to walk more without significant shortness of breath.    Past Medical History:  Diagnosis Date  . Multiple myeloma ([place])   . Solitary kidney       Past Surgical History:  Procedure Laterality Date  . RIGHT/LEFT HEART CATH AND CORONARY ANGIOGRAPHY N/A 09/11/2017   Procedure: RIGHT/LEFT HEART CATH AND CORONARY ANGIOGRAPHY;  Surgeon: Nigel Mormon, MD;  Location: [facility];  Service: Cardiovascular;  Laterality: N/A;      Social History   Socioeconomic History  . Marital status: Single    Spouse name: Not on file  . Number of children: 0  . Years of education: Not on file  . Highest education level: Not on file  Occupational History  . Not on file  Social Needs  . Financial resource strain: Not on file  . Food insecurity    Worry: Not on file    Inability: Not on file  . Transportation needs    Medical: Not on file    Non-medical: Not on file  Tobacco Use  . Smoking status: Former Smoker    Packs/day: 1.00    Years: 30.00    Pack years: 30.00    Types: Cigarettes    Quit date: 02/20/1989    Years since quitting: 30.2  . Smokeless tobacco: Never Used  Substance and Sexual Activity  . Alcohol use: No  . Drug use: Not on file  . Sexual activity: Not on file  Lifestyle  . Physical activity   Days per week: Not on file    Minutes per session: Not on file  . Stress: Not on file  Relationships  . Social Herbalist on phone: Not on file    Gets together: Not on file    Attends religious service: Not on file    Active member of club or organization: Not on file    Attends meetings of clubs or organizations: Not on file    Relationship status: Not on file  . Intimate partner violence    Fear of current or ex partner: Not on file    Emotionally abused: Not on file    Physically abused: Not on file    Forced sexual activity: Not on file  Other Topics Concern  . Not on file  Social History Narrative  . Not on file      Current Outpatient Medications on File Prior to Visit  Medication Sig Dispense Refill  . acetaminophen-codeine (TYLENOL #3) 300-30 MG tablet Take 1 tablet by mouth every 4 (four) hours as needed for moderate pain.    Marland Kitchen colchicine 0.6 MG tablet Take 0.6 mg by mouth daily.    . carvedilol (COREG) 12.5 MG tablet TAKE 1 TABLET BY MOUTH TWICE DAILY (Patient not taking: Reported on 12/31/2018) 180 tablet 1  . pravastatin (  PRAVACHOL) 20 MG tablet Take 20 mg by mouth daily.     No current facility-administered medications on file prior to visit.     Cardiovascular studies:  Echocardiogram 01/22/2019: Severely depressed LV systolic function with EF 20%. Left ventricle cavity is moderately dilated. Normal left ventricular wall thickness. Normal global wall motion. Doppler evidence of grade I (impaired) diastolic dysfunction, normal LAP. Calculated EF 20%. Left atrial cavity is moderately dilated at 4.7 cm. Moderate (Grade II) mitral regurgitation. Mild mitral valve leaflet thickening. IVC is dilated with respiratory variation. Estimated RA pressure 8 mmHg. Pulmonary hypertension seen on echocardiogram on 04/13/2017 not well appreciated on this study. No other significant change.    EKG 01/11/2019: Sinus rhythm  First degree AV block Left bundle branch  block.   R/LHC 09/11/2017: No angiographically significant coronary artery disease Well compensated nonischemic cardiomyopathy  Echocardiogram 09/14/2017: 1. Left ventricle cavity is normal in size. Moderate concentric hypertrophy of the left ventricle. Visual EF is 25-30%. Abnormal septal wall motion due to left bundle branch block. All other walls are moderate to severely hypokinetic. Doppler evidence of grade III (restrictive) diastolic dysfunction. Calculated EF 22%. 2. Left atrial cavity is normal in size. 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 3. Mild (Grade I) aortic regurgitation. 4. Mild (Grade I) mitral regurgitation. 5. Mild tricuspid regurgitation. No evidence of pulmonary hypertension.  Recent labs: 03/20/2019: Glucose 113. BUN/Cr 47/ 2.0. eGR 37. Na/K 139/5.1   12/18/2018: Results for REO, PORTELA (MRN [id number]) as of 01/08/2019 21:53  Ref. Range 01/04/2019 18:84  BASIC METABOLIC PANEL Unknown Rpt (A)  Sodium Latest Ref Range: 134 - 144 mmol/L 140  Potassium Latest Ref Range: 3.5 - 5.2 mmol/L 3.5  Chloride Latest Ref Range: 96 - 106 mmol/L 99  CO2 Latest Ref Range: 20 - 29 mmol/L 26  Glucose Latest Ref Range: 65 - 99 mg/dL 111 (H)  BUN Latest Ref Range: 8 - 27 mg/dL 23  Creatinine Latest Ref Range: 0.76 - 1.27 mg/dL 1.67 (H)  Calcium Latest Ref Range: 8.6 - 10.2 mg/dL 8.7  BUN/Creatinine Ratio Latest Ref Range: 10 - 24  14  GFR, Est Non African American Latest Ref Range: >59 mL/min/1.73 40 (L)  GFR, Est African American Latest Ref Range: >59 mL/min/1.73 46 (L)    Review of Systems  Constitution: Negative for decreased appetite, malaise/fatigue, weight gain and weight loss.  HENT: Negative for congestion.   Eyes: Negative for visual disturbance.  Cardiovascular: Positive for dyspnea on exertion (Improved). Negative for chest pain, leg swelling, palpitations and syncope.  Respiratory: Positive for shortness of  breath (Improved).   Endocrine: Negative for cold intolerance.  Hematologic/Lymphatic: Does not bruise/bleed easily.  Skin: Negative for itching and rash.  Musculoskeletal: Negative for myalgias.  Gastrointestinal: Negative for abdominal pain, nausea and vomiting.  Genitourinary: Negative for dysuria.  Neurological: Negative for dizziness and weakness.  Psychiatric/Behavioral: The patient is not nervous/anxious.   All other systems reviewed and are negative.       Vitals:   05/31/19 1111  BP: 134/68  Pulse: 76  Temp: (!) 96.9 F (36.1 C)  SpO2: 96%     Physical Exam  Constitutional: He is oriented to person, place, and time. He appears well-developed and well-nourished. No distress.  HENT:  Head: Normocephalic and atraumatic.  Eyes: Pupils are equal, round, and reactive to light. Conjunctivae are normal.  Neck: No JVD present.  Cardiovascular: Normal rate, regular rhythm and intact distal  pulses.  Pulmonary/Chest: Effort normal and breath sounds normal. He has no wheezes. He has no rales.  Abdominal: Soft. Bowel sounds are normal. There is no rebound.  Musculoskeletal:        General: Edema ( +Tace LLE) present.  Lymphadenopathy:    He has no cervical adenopathy.  Neurological: He is alert and oriented to person, place, and time. No cranial nerve deficit.  Skin: Skin is warm and dry.  Psychiatric: He has a normal mood and affect.  Nursing note and vitals reviewed.       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  HFrEF: NYHA class II-III. Nonischemic cardiomyopathy. Echocardiogram 01/2019-EF remains low around 20-25%.  Now that his blood pressure is improved, I will introduce beta-blocker in the form of metoprolol succinate 25 mg once daily. Given his tenuous renal function and solitary kidney, he is not  on ARNI/ACE/ARB. Started Increase Bidil to 1 tab of 20-37./5 mg bid. Continue torsemide 20 mg daily.  Cr is improved form 2.14 in June to 2.0 in July 2020.    H/o DVT: On warfarin, as per PCP Needs to follow up with them.  Elevated IgA: On immune electrophoresis performed looking for amyloidosis, he had  faint band in IgA; light chain specificity could not be verified. Given his h/o multilpe myeloma, as well as h//o DVT, I will refer him to hematology for further evaluation.   CKD3: Continue follow up with nephrology  Anemia: Likely due to CKD. Iron is repleted. Follow up with nephrology.  F/u in 3 months  [place], MD [facility] Pager: [phone] Office: [phone] If no answer Cell [phone]

## 2019-06-03 ENCOUNTER — Other Ambulatory Visit: Payer: Self-pay | Admitting: Cardiology

## 2019-06-07 ENCOUNTER — Telehealth: Payer: Self-pay | Admitting: Hematology

## 2019-06-07 NOTE — Telephone Encounter (Signed)
A new hem appt has been scheduled for Mark Baird to see Dr. Irene Limbo on 10/27 at 10am. A message has been sent to the referring office to notify the pt.

## 2019-07-02 ENCOUNTER — Inpatient Hospital Stay: Payer: PPO | Admitting: Hematology

## 2019-07-02 ENCOUNTER — Telehealth: Payer: Self-pay | Admitting: Hematology

## 2019-07-02 NOTE — Telephone Encounter (Signed)
Mr. Schiesser has been cld and r/s to see Dr. Irene Limbo on 11/12 at 1pm.

## 2019-07-18 ENCOUNTER — Telehealth: Payer: Self-pay | Admitting: Hematology

## 2019-07-18 ENCOUNTER — Inpatient Hospital Stay: Payer: PPO | Admitting: Hematology

## 2019-07-18 NOTE — Telephone Encounter (Signed)
Mr. Dash cld to reschedule appt w/Dr. Irene Limbo to 12/1 at 10am due to the weather.

## 2019-07-29 ENCOUNTER — Other Ambulatory Visit: Payer: Self-pay | Admitting: Cardiology

## 2019-07-29 DIAGNOSIS — I5022 Chronic systolic (congestive) heart failure: Secondary | ICD-10-CM

## 2019-08-03 ENCOUNTER — Other Ambulatory Visit: Payer: Self-pay | Admitting: Cardiology

## 2019-08-05 NOTE — Progress Notes (Signed)
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 is a wonderful 73 y.o. 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 The pt reports that he is doing well overall.  The pt reports that he has been seeing Dr Virgina Jock, Cardiologist, for a weakened heart muscle. 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 He has also concurrently been experiencing gout in both of his feet. 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 Pt states that he has been diagnosed with Multiple Myeloma but is unsure of what physician he saw or when the diagnosis was given. He believes the last time that he went to see a physician who was following his MM was about 6-7 years ago. He does remember being given chemotherapy and radiation to treat his Multiple Myeloma. There is some question about whether he had a bone marrow transplant.    Pt has a history of smoking and alcohol intake but he quit both about 30 years ago. He had no history of any heart rhythm issues. His current heart problems were first noticed about 2 years ago. Pt has not had any stents placed in his heart. Pt was born with one underdeveloped kidney and was never given any indication as to why the kidney was underdeveloped. He currently has CKD and sees Dr. Pearson Grippe, Nephrologist, at [facility].   Most recent lab results (03/20/2019) of BMP is as follows: all values are WNL except for Glucose at 113, BUN at 47, Creatinine at 2.00, GFR Est Non Afr Am at 32, Chloride at 107. 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   On review of systems, pt reports knee pain and denies new bone pain, back pain, fevers, chills, night sweats and any other symptoms.   On PMHx the pt reports Gout, Multiple Myeloma, solitary kidney, CKD. On Social Hx the pt reports that he is previous cigarette smoker and EtOH drinker but quit 30 years ago.   MEDICAL HISTORY:  Past Medical History:  Diagnosis Date  . Multiple myeloma ([place])   . Solitary kidney     SURGICAL HISTORY: Past Surgical History:  Procedure Laterality Date  . RIGHT/LEFT HEART CATH AND CORONARY ANGIOGRAPHY N/A 09/11/2017   Procedure: RIGHT/LEFT HEART CATH AND CORONARY ANGIOGRAPHY;  Surgeon: Nigel Mormon, MD;  Location: [facility];  Service: Cardiovascular;  Laterality: N/A;    SOCIAL HISTORY: Social History   Socioeconomic History  . Marital status: Single    Spouse name: Not on file  . Number of children: 0  . Years of education: Not on file  . Highest education level: Not on file  Occupational History  . Not on file  Social Needs  . Financial resource strain: Not on file  . Food insecurity    Worry: Not on file    Inability: Not on file  . Transportation needs    Medical: Not on file    Non-medical: Not on file  Tobacco Use  .  Smoking status: Former Smoker    Packs/day: 1.00    Years: 30.00    Pack years: 30.00    Types: Cigarettes    Quit date: 02/20/1989    Years since quitting: 30.4  . Smokeless tobacco: Never Used  Substance and Sexual Activity  . Alcohol use: No  . Drug use: Not on file  . Sexual activity: Not on file  Lifestyle  . Physical activity    Days per week: Not on file    Minutes per session: Not on file  . Stress: Not on file  Relationships  . Social Herbalist on phone: Not on file    Gets together: Not on file    Attends  religious service: Not on file    Active member of club or organization: Not on file    Attends meetings of clubs or organizations: Not on file    Relationship status: Not on file  . Intimate partner violence    Fear of current or ex partner: Not on file    Emotionally abused: Not on file    Physically abused: Not on file    Forced sexual activity: Not on file  Other Topics Concern  . Not on file  Social History Narrative  . Not on file    FAMILY HISTORY: Family History  Problem Relation Age of Onset  . Emphysema Father     ALLERGIES:  is allergic to molds & smuts and penicillins.  MEDICATIONS:  Current Outpatient Medications  Medication Sig Dispense Refill  . acetaminophen-codeine (TYLENOL #3) 300-30 MG tablet Take 1 tablet by mouth every 4 (four) hours as needed for moderate pain.    Marland Kitchen allopurinol (ZYLOPRIM) 300 MG tablet daily.    Marland Kitchen BIDIL 20-37.5 MG tablet TAKE 1/2 TABLET BY MOUTH TWICE DAILY 90 tablet 3  . metoprolol succinate (TOPROL-XL) 25 MG 24 hr tablet Take 1 tablet (25 mg total) by mouth daily. Take with or immediately following a meal. 30 tablet 3  . potassium chloride SA (K-DUR) 20 MEQ tablet TAKE 2 TABLETS BY MOUTH DAILY 90 tablet 2  . pravastatin (PRAVACHOL) 20 MG tablet Take 20 mg by mouth daily.    Marland Kitchen torsemide (DEMADEX) 20 MG tablet Take 1 tablet (20 mg total) by mouth daily. 60 tablet 2  . warfarin (COUMADIN) 10 MG tablet TAKE 1 TABLET(10 MG) BY MOUTH DAILY 90 tablet 0   No current facility-administered medications for this visit.     REVIEW OF SYSTEMS:    10 Point review of Systems was done is negative except as noted above.  PHYSICAL EXAMINATION: ECOG PERFORMANCE STATUS: 2 - Symptomatic, <50% confined to bed  . Vitals:   08/06/19 1130  BP: 137/73  Pulse: 64  Resp: 18  Temp: 98.7 F (37.1 C)  SpO2: 100%   Filed Weights   08/06/19 1130  Weight: 199 lb (90.3 kg)   .Body mass index is 27.75 kg/m.  GENERAL:alert, in no acute distress and  comfortable SKIN: no acute rashes, no significant lesions EYES: conjunctiva are pink and non-injected, sclera anicteric OROPHARYNX: MMM, no exudates, no oropharyngeal erythema or ulceration NECK: supple, no JVD LYMPH:  no palpable lymphadenopathy in the cervical, axillary or inguinal regions LUNGS: clear to auscultation b/l with normal respiratory effort HEART: regular rate & rhythm ABDOMEN:  normoactive bowel sounds , non tender, not distended. Extremity: no pedal edema PSYCH: alert & oriented x 3 with fluent speech NEURO: no focal motor/sensory deficits  LABORATORY DATA:  I have reviewed the data as listed  . CBC Latest Ref Rng & Units 08/06/2019 06/28/2018 10/11/2010  WBC 4.0 - 10.5 K/uL 3.7(L) 4.3 3.9(L)  Hemoglobin 13.0 - 17.0 g/dL 7.9(L) 10.9(L) 11.7(L)  Hematocrit 39.0 - 52.0 % 24.2(L) 34.6(L) 33.6(L)  Platelets 150 - 400 K/uL 116(L) 157 144    . CMP Latest Ref Rng & Units 08/06/2019 01/04/2019 07/01/2018  Glucose 70 - 99 mg/dL 106(H) 111(H) 106(H)  BUN 8 - 23 mg/dL 25(H) 23 30(H)  Creatinine 0.61 - 1.24 mg/dL 2.06(H) 1.67(H) 2.08(H)  Sodium 135 - 145 mmol/L 142 140 139  Potassium 3.5 - 5.1 mmol/L 4.5 3.5 3.7  Chloride 98 - 111 mmol/L 105 99 106  CO2 22 - 32 mmol/L 25 26 23   Calcium 8.9 - 10.3 mg/dL 9.5 8.7 9.0  Total Protein 6.5 - 8.1 g/dL 9.3(H) - -  Total Bilirubin 0.3 - 1.2 mg/dL 0.3 - -  Alkaline Phos 38 - 126 U/L 111 - -  AST 15 - 41 U/L 12(L) - -  ALT 0 - 44 U/L 9 - -       RADIOGRAPHIC STUDIES: I have personally reviewed the radiological images as listed and agreed with the findings in the report. No results found.  ASSESSMENT & PLAN:   PLAN: -Discussed patient's most recent labs from 03/20/2019, all values are WNL except for Glucose at 113, BUN at 47, Creatinine at 2.00, GFR Est Non Afr Am at 32, Chloride at 107. -Discussed 02/27/2019 IFE Serum is as follows: Immunofixation Serum result shows "Immunofixation shows IgA monoclonal protein with kappa light  chain specificity.", IgG at 866, IgA at 1896, IgM at 22. -Advised pt that we need to get a baseline to see where his Multiple Myeloma currently stands. There is a reasonable concern for relapse. -Currently awaiting previous medical records regarding his history of Multiple Myeloma -Recommended pt to f/u with Dr Virgina Jock as needed for his cardiac amyloidosis -Recommended pt to f/u with Dr Pearson Grippe as needed for CKD, solitary kidney -Will get additional labs today  -Will get a 24-hour UPEP -Will get a PET/CT scan in 5-7 days  -Will get a BM Bx in 5-7 days  -Will see back in 2 weeks   FOLLOW UP: Labs today PET/CT in 1 week CT bone marrow biopsy in 1 week RTC with Dr Irene Limbo in 2 weeks  All of the patients questions were answered with apparent satisfaction. The patient knows to call the clinic with any problems, questions or concerns.  I spent 20 mins counseling the patient face to face. The total time spent in the appointment was 30 minutes and more than 50% was on counseling and direct patient cares.    Sullivan Lone MD [place] AAHIVMS [facility] Hematology/Oncology Physician [facility]  (Office):       [phone] (Work cell):  [phone] (Fax):           [phone]  08/06/2019 4:57 PM  I, Yevette Edwards, am acting as a scribe for Dr. Sullivan Lone.   .I have reviewed the above documentation for accuracy and completeness, and I agree with the above. Brunetta Genera MD

## 2019-08-06 ENCOUNTER — Inpatient Hospital Stay: Payer: PPO

## 2019-08-06 ENCOUNTER — Inpatient Hospital Stay: Payer: PPO | Attending: Hematology | Admitting: Hematology

## 2019-08-06 ENCOUNTER — Other Ambulatory Visit: Payer: Self-pay

## 2019-08-06 ENCOUNTER — Telehealth: Payer: Self-pay | Admitting: Hematology

## 2019-08-06 VITALS — BP 137/73 | HR 64 | Temp 98.7°F | Resp 18 | Ht 71.0 in | Wt 199.0 lb

## 2019-08-06 DIAGNOSIS — C9002 Multiple myeloma in relapse: Secondary | ICD-10-CM

## 2019-08-06 DIAGNOSIS — Z9221 Personal history of antineoplastic chemotherapy: Secondary | ICD-10-CM | POA: Insufficient documentation

## 2019-08-06 DIAGNOSIS — C9000 Multiple myeloma not having achieved remission: Secondary | ICD-10-CM | POA: Insufficient documentation

## 2019-08-06 DIAGNOSIS — M25569 Pain in unspecified knee: Secondary | ICD-10-CM | POA: Insufficient documentation

## 2019-08-06 DIAGNOSIS — Z905 Acquired absence of kidney: Secondary | ICD-10-CM | POA: Diagnosis not present

## 2019-08-06 DIAGNOSIS — Z79899 Other long term (current) drug therapy: Secondary | ICD-10-CM | POA: Diagnosis not present

## 2019-08-06 DIAGNOSIS — N189 Chronic kidney disease, unspecified: Secondary | ICD-10-CM | POA: Insufficient documentation

## 2019-08-06 DIAGNOSIS — Z923 Personal history of irradiation: Secondary | ICD-10-CM | POA: Diagnosis not present

## 2019-08-06 DIAGNOSIS — Z87891 Personal history of nicotine dependence: Secondary | ICD-10-CM | POA: Diagnosis not present

## 2019-08-06 DIAGNOSIS — M109 Gout, unspecified: Secondary | ICD-10-CM | POA: Diagnosis not present

## 2019-08-06 LAB — CMP (CANCER CENTER ONLY)
ALT: 9 U/L (ref 0–44)
AST: 12 U/L — ABNORMAL LOW (ref 15–41)
Albumin: 3.3 g/dL — ABNORMAL LOW (ref 3.5–5.0)
Alkaline Phosphatase: 111 U/L (ref 38–126)
Anion gap: 12 (ref 5–15)
BUN: 25 mg/dL — ABNORMAL HIGH (ref 8–23)
CO2: 25 mmol/L (ref 22–32)
Calcium: 9.5 mg/dL (ref 8.9–10.3)
Chloride: 105 mmol/L (ref 98–111)
Creatinine: 2.06 mg/dL — ABNORMAL HIGH (ref 0.61–1.24)
GFR, Est AFR Am: 36 mL/min — ABNORMAL LOW (ref >60)
GFR, Estimated: 31 mL/min — ABNORMAL LOW (ref >60)
Glucose, Bld: 106 mg/dL — ABNORMAL HIGH (ref 70–99)
Potassium: 4.5 mmol/L (ref 3.5–5.1)
Sodium: 142 mmol/L (ref 135–145)
Total Bilirubin: 0.3 mg/dL (ref 0.3–1.2)
Total Protein: 9.3 g/dL — ABNORMAL HIGH (ref 6.5–8.1)

## 2019-08-06 LAB — CBC WITH DIFFERENTIAL/PLATELET
Abs Immature Granulocytes: 0 10*3/uL (ref 0.00–0.07)
Basophils Absolute: 0 10*3/uL (ref 0.0–0.1)
Basophils Relative: 1 %
Eosinophils Absolute: 0.1 10*3/uL (ref 0.0–0.5)
Eosinophils Relative: 1 %
HCT: 24.2 % — ABNORMAL LOW (ref 39.0–52.0)
Hemoglobin: 7.9 g/dL — ABNORMAL LOW (ref 13.0–17.0)
Immature Granulocytes: 0 %
Lymphocytes Relative: 27 %
Lymphs Abs: 1 10*3/uL (ref 0.7–4.0)
MCH: 32.6 pg (ref 26.0–34.0)
MCHC: 32.6 g/dL (ref 30.0–36.0)
MCV: 100 fL (ref 80.0–100.0)
Monocytes Absolute: 0.1 10*3/uL (ref 0.1–1.0)
Monocytes Relative: 4 %
Neutro Abs: 2.5 10*3/uL (ref 1.7–7.7)
Neutrophils Relative %: 67 %
Platelets: 116 10*3/uL — ABNORMAL LOW (ref 150–400)
RBC: 2.42 MIL/uL — ABNORMAL LOW (ref 4.22–5.81)
RDW: 13.6 % (ref 11.5–15.5)
WBC: 3.7 10*3/uL — ABNORMAL LOW (ref 4.0–10.5)
nRBC: 0 % (ref 0.0–0.2)

## 2019-08-06 LAB — LACTATE DEHYDROGENASE: LDH: 118 U/L (ref 98–192)

## 2019-08-06 NOTE — Telephone Encounter (Signed)
Gave avs and calendar he went to lab

## 2019-08-06 NOTE — Patient Instructions (Signed)
Thank you for choosing [facility] to provide your oncology and hematology care.   Should you have questions after your visit to the [facility] ([facility]), please contact this office at [phone] between 8:30 AM and 4:30 PM.  Voice mails left after 4:00 PM may not be returned until the following business day.  Calls received after 4:30 PM will be answered by an off-site Nurse Triage Line.    Prescription Refills:  Please have your pharmacy contact us directly for most prescription requests.  Contact the office directly for refills of narcotics (pain medications). Allow 48-72 hours for refills.  Appointments: Please contact the [facility] scheduling department [phone] for questions regarding [facility] appointment scheduling.  Contact the schedulers with any scheduling changes so that your appointment can be rescheduled in a timely manner.   Central Scheduling for [place] [phone] - Call to schedule procedures such as PET scans, CT scans, MRI, Ultrasound, etc.  To afford each patient quality time with our providers, please arrive 30 minutes before your scheduled appointment time.  If you arrive late for your appointment, you may be asked to reschedule.  We strive to give you quality time with our providers, and arriving late affects you and other patients whose appointments are after yours. If you are a no show for multiple scheduled visits, you may be dismissed from the clinic at the providers discretion.     Resources: [facility] Social Workers [phone] for additional information on assistance programs --Anne Cunningham/Abigail Elmore  [place] DSS  [phone]: Information regarding food stamps, Medicaid, and utility assistance [place]AT [phone]   [place] [employer]'s shared-ride transportation service for eligible riders who have a disability that prevents them from riding the fixed route bus.   Medicare Rights Center 800-333-4114 Helps people with  Medicare understand their rights and benefits, navigate the Medicare system, and secure the quality healthcare they deserve [employer] 800-227-2345 Assists patients locate various types of support and financial assistance Cancer Care: 1-800-813-HOPE (4673) Provides financial assistance, online support groups, medication/co-pay assistance.   Transportation Assistance for appointments at [facility]: Transportation Coordinator [phone]  Again, thank you for choosing [facility] for your care.       

## 2019-08-07 LAB — KAPPA/LAMBDA LIGHT CHAINS
Kappa free light chain: 43.9 mg/L — ABNORMAL HIGH (ref 3.3–19.4)
Kappa, lambda light chain ratio: 4.1 — ABNORMAL HIGH (ref 0.26–1.65)
Lambda free light chains: 10.7 mg/L (ref 5.7–26.3)

## 2019-08-07 LAB — MULTIPLE MYELOMA PANEL, SERUM
Albumin SerPl Elph-Mcnc: 3.7 g/dL (ref 2.9–4.4)
Albumin/Glob SerPl: 0.8 (ref 0.7–1.7)
Alpha 1: 0.3 g/dL (ref 0.0–0.4)
Alpha2 Glob SerPl Elph-Mcnc: 0.8 g/dL (ref 0.4–1.0)
B-Globulin SerPl Elph-Mcnc: 3.5 g/dL — ABNORMAL HIGH (ref 0.7–1.3)
Gamma Glob SerPl Elph-Mcnc: 0.5 g/dL (ref 0.4–1.8)
Globulin, Total: 5 g/dL — ABNORMAL HIGH (ref 2.2–3.9)
IgA: 2794 mg/dL — ABNORMAL HIGH (ref 61–437)
IgG (Immunoglobin G), Serum: 632 mg/dL (ref 603–1613)
IgM (Immunoglobulin M), Srm: 17 mg/dL (ref 15–143)
M Protein SerPl Elph-Mcnc: 2.7 g/dL — ABNORMAL HIGH
Total Protein ELP: 8.7 g/dL — ABNORMAL HIGH (ref 6.0–8.5)

## 2019-08-07 LAB — BETA 2 MICROGLOBULIN, SERUM: Beta-2 Microglobulin: 6.8 mg/L — ABNORMAL HIGH (ref 0.6–2.4)

## 2019-08-08 ENCOUNTER — Other Ambulatory Visit: Payer: Self-pay

## 2019-08-08 DIAGNOSIS — C9002 Multiple myeloma in relapse: Secondary | ICD-10-CM

## 2019-08-08 DIAGNOSIS — C9000 Multiple myeloma not having achieved remission: Secondary | ICD-10-CM | POA: Diagnosis not present

## 2019-08-09 LAB — UPEP/UIFE/LIGHT CHAINS/TP, 24-HR UR
% BETA, Urine: 43.6 %
ALPHA 1 URINE: 5.1 %
Albumin, U: 33.6 %
Alpha 2, Urine: 11.8 %
Free Kappa Lt Chains,Ur: 309.87 mg/L — ABNORMAL HIGH (ref 0.63–113.79)
Free Kappa/Lambda Ratio: 56.96 — ABNORMAL HIGH (ref 1.03–31.76)
Free Lambda Lt Chains,Ur: 5.44 mg/L (ref 0.47–11.77)
GAMMA GLOBULIN URINE: 6 %
M-SPIKE %, Urine: 21.1 % — ABNORMAL HIGH
M-Spike, Mg/24 Hr: 36 mg/24 hr — ABNORMAL HIGH
Total Protein, Urine-Ur/day: 170 mg/24 hr — ABNORMAL HIGH (ref 30–150)
Total Protein, Urine: 21.2 mg/dL
Total Volume: 800

## 2019-08-13 ENCOUNTER — Other Ambulatory Visit: Payer: Self-pay | Admitting: Radiology

## 2019-08-14 ENCOUNTER — Ambulatory Visit (HOSPITAL_COMMUNITY)
Admission: RE | Admit: 2019-08-14 | Discharge: 2019-08-14 | Disposition: A | Discharge status: Home / Self Care | Payer: PPO | Source: Ambulatory Visit | Attending: Hematology | Admitting: Hematology

## 2019-08-14 ENCOUNTER — Other Ambulatory Visit: Payer: Self-pay

## 2019-08-14 ENCOUNTER — Encounter (HOSPITAL_COMMUNITY): Payer: Self-pay | Admitting: Radiology

## 2019-08-14 ENCOUNTER — Telehealth: Payer: Self-pay

## 2019-08-14 DIAGNOSIS — N189 Chronic kidney disease, unspecified: Secondary | ICD-10-CM | POA: Insufficient documentation

## 2019-08-14 DIAGNOSIS — Z7901 Long term (current) use of anticoagulants: Secondary | ICD-10-CM | POA: Diagnosis not present

## 2019-08-14 DIAGNOSIS — Z880 Allergy status to penicillin: Secondary | ICD-10-CM | POA: Insufficient documentation

## 2019-08-14 DIAGNOSIS — D61818 Other pancytopenia: Secondary | ICD-10-CM | POA: Diagnosis not present

## 2019-08-14 DIAGNOSIS — Q600 Renal agenesis, unilateral: Secondary | ICD-10-CM | POA: Diagnosis not present

## 2019-08-14 DIAGNOSIS — Z87891 Personal history of nicotine dependence: Secondary | ICD-10-CM | POA: Insufficient documentation

## 2019-08-14 DIAGNOSIS — C9002 Multiple myeloma in relapse: Secondary | ICD-10-CM | POA: Insufficient documentation

## 2019-08-14 DIAGNOSIS — Z79899 Other long term (current) drug therapy: Secondary | ICD-10-CM | POA: Insufficient documentation

## 2019-08-14 DIAGNOSIS — C9000 Multiple myeloma not having achieved remission: Secondary | ICD-10-CM | POA: Diagnosis not present

## 2019-08-14 LAB — BASIC METABOLIC PANEL
Anion gap: 13 (ref 5–15)
BUN: 48 mg/dL — ABNORMAL HIGH (ref 8–23)
CO2: 22 mmol/L (ref 22–32)
Calcium: 9.4 mg/dL (ref 8.9–10.3)
Chloride: 106 mmol/L (ref 98–111)
Creatinine, Ser: 2.06 mg/dL — ABNORMAL HIGH (ref 0.61–1.24)
GFR calc Af Amer: 36 mL/min — ABNORMAL LOW (ref >60)
GFR calc non Af Amer: 31 mL/min — ABNORMAL LOW (ref >60)
Glucose, Bld: 117 mg/dL — ABNORMAL HIGH (ref 70–99)
Potassium: 4.3 mmol/L (ref 3.5–5.1)
Sodium: 141 mmol/L (ref 135–145)

## 2019-08-14 LAB — CBC WITH DIFFERENTIAL/PLATELET
Abs Immature Granulocytes: 0.02 10*3/uL (ref 0.00–0.07)
Basophils Absolute: 0 10*3/uL (ref 0.0–0.1)
Basophils Relative: 0 %
Eosinophils Absolute: 0.1 10*3/uL (ref 0.0–0.5)
Eosinophils Relative: 2 %
HCT: 30.3 % — ABNORMAL LOW (ref 39.0–52.0)
Hemoglobin: 9.8 g/dL — ABNORMAL LOW (ref 13.0–17.0)
Immature Granulocytes: 1 %
Lymphocytes Relative: 24 %
Lymphs Abs: 0.8 10*3/uL (ref 0.7–4.0)
MCH: 32.5 pg (ref 26.0–34.0)
MCHC: 32.3 g/dL (ref 30.0–36.0)
MCV: 100.3 fL — ABNORMAL HIGH (ref 80.0–100.0)
Monocytes Absolute: 0.2 10*3/uL (ref 0.1–1.0)
Monocytes Relative: 5 %
Neutro Abs: 2.2 10*3/uL (ref 1.7–7.7)
Neutrophils Relative %: 68 %
Platelets: 125 10*3/uL — ABNORMAL LOW (ref 150–400)
RBC: 3.02 MIL/uL — ABNORMAL LOW (ref 4.22–5.81)
RDW: 13.4 % (ref 11.5–15.5)
WBC: 3.2 10*3/uL — ABNORMAL LOW (ref 4.0–10.5)
nRBC: 0 % (ref 0.0–0.2)

## 2019-08-14 LAB — PROTIME-INR
INR: 1.5 — ABNORMAL HIGH (ref 0.8–1.2)
Prothrombin Time: 18.1 seconds — ABNORMAL HIGH (ref 11.4–15.2)

## 2019-08-14 MED ORDER — FLUMAZENIL 0.5 MG/5ML IV SOLN
INTRAVENOUS | Status: AC
  Filled 2019-08-14: qty 5

## 2019-08-14 MED ORDER — LIDOCAINE HCL (PF) 1 % IJ SOLN
INTRAMUSCULAR | Status: AC | PRN
  Administered 2019-08-14: 10 mL

## 2019-08-14 MED ORDER — SODIUM CHLORIDE 0.9 % IV SOLN
INTRAVENOUS | Status: DC
  Administered 2019-08-14: 10:00:00 via INTRAVENOUS

## 2019-08-14 MED ORDER — FENTANYL CITRATE (PF) 100 MCG/2ML IJ SOLN
INTRAMUSCULAR | Status: AC | PRN
  Administered 2019-08-14: 50 ug via INTRAVENOUS

## 2019-08-14 MED ORDER — MIDAZOLAM HCL 2 MG/2ML IJ SOLN
INTRAMUSCULAR | Status: AC | PRN
  Administered 2019-08-14: 1 mg via INTRAVENOUS
  Administered 2019-08-14: 0.5 mg via INTRAVENOUS

## 2019-08-14 MED ORDER — NALOXONE HCL 0.4 MG/ML IJ SOLN
INTRAMUSCULAR | Status: AC
  Filled 2019-08-14: qty 1

## 2019-08-14 MED ORDER — MIDAZOLAM HCL 2 MG/2ML IJ SOLN
INTRAMUSCULAR | Status: AC
  Filled 2019-08-14: qty 2

## 2019-08-14 MED ORDER — FENTANYL CITRATE (PF) 100 MCG/2ML IJ SOLN
INTRAMUSCULAR | Status: AC
  Filled 2019-08-14: qty 2

## 2019-08-14 NOTE — Consult Note (Signed)
Chief Complaint: Patient was seen in consultation today for CT-guided bone marrow biopsy  Referring Physician(s): Brunetta Genera  Supervising Physician: Markus Daft  Patient Status: [facility] - Out-pt  History of Present Illness: Mark Baird. is a 73 y.o. male with history of gout, solitary kidney/chronic kidney disease and multiple myeloma (diagnosed and treated approx 10 yrs ago per pt).  Recent laboratory evaluation reveals elevated free kappa light chains and free kappa lambda ratio along with M spike/IgA monoclonal protein with kappa light chain specificity.  He presents today for CT-guided bone marrow biopsy for further evaluation/rule out relapse.  Past Medical History:  Diagnosis Date   Multiple myeloma ([place])    Solitary kidney     Past Surgical History:  Procedure Laterality Date   RIGHT/LEFT HEART CATH AND CORONARY ANGIOGRAPHY N/A 09/11/2017   Procedure: RIGHT/LEFT HEART CATH AND CORONARY ANGIOGRAPHY;  Surgeon: Nigel Mormon, MD;  Location: [place] [facility];  Service: Cardiovascular;  Laterality: N/A;    Allergies: Molds & smuts and Penicillins  Medications: Prior to Admission medications   Medication Sig Start Date End Date Taking? Authorizing Provider  acetaminophen-codeine (TYLENOL #3) 300-30 MG tablet Take 1 tablet by mouth every 4 (four) hours as needed for moderate pain. 09/24/18   Lucianne Lei, MD  allopurinol (ZYLOPRIM) 300 MG tablet daily. 01/29/19   [provider]  BIDIL 20-37.5 MG tablet TAKE 1/2 TABLET BY MOUTH TWICE DAILY 07/29/19   Patwardhan, Manish J, MD  metoprolol succinate (TOPROL-XL) 25 MG 24 hr tablet Take 1 tablet (25 mg total) by mouth daily. Take with or immediately following a meal. 04/22/19 07/21/19  Patwardhan, Manish J, MD  potassium chloride SA (K-DUR) 20 MEQ tablet TAKE 2 TABLETS BY MOUTH DAILY 06/03/19   Patwardhan, Manish J, MD  pravastatin (PRAVACHOL) 20 MG tablet Take 20 mg by mouth daily.    [provider]  torsemide (DEMADEX) 20 MG tablet Take 1 tablet (20 mg total) by mouth daily. 04/22/19   Patwardhan, Reynold Bowen, MD  warfarin (COUMADIN) 10 MG tablet TAKE 1 TABLET(10 MG) BY MOUTH DAILY 08/05/19   Patwardhan, Reynold Bowen, MD     Family History  Problem Relation Age of Onset   Emphysema Father     Social History   Socioeconomic History   Marital status: Single    Spouse name: Not on file   Number of children: 0   Years of education: Not on file   Highest education level: Not on file  Occupational History   Not on file  Social Needs   Financial resource strain: Not on file   Food insecurity    Worry: Not on file    Inability: Not on file   Transportation needs    Medical: Not on file    Non-medical: Not on file  Tobacco Use   Smoking status: Former Smoker    Packs/day: 1.00    Years: 30.00    Pack years: 30.00    Types: Cigarettes    Quit date: 02/20/1989    Years since quitting: 30.4   Smokeless tobacco: Never Used  Substance and Sexual Activity   Alcohol use: No   Drug use: Not on file   Sexual activity: Not on file  Lifestyle   Physical activity    Days per week: Not on file    Minutes per session: Not on file   Stress: Not on file  Relationships   Social connections    Talks on phone: Not  on file    Gets together: Not on file    Attends religious service: Not on file    Active member of club or organization: Not on file    Attends meetings of clubs or organizations: Not on file    Relationship status: Not on file  Other Topics Concern   Not on file  Social History Narrative   Not on file      Review of Systems currently denies fever, headache, chest pain, dyspnea, cough, abdominal/back pain, nausea, vomiting or bleeding  Vital Signs: Blood pressure 135/78, heart rate 75, temperature 98, respirations 16, O2 sat 98% room air   Physical Exam awake, alert.  Chest clear to auscultation bilaterally.  Heart with regular rate and  rhythm.  Abdomen soft, positive bowel sounds, nontender.  No lower extremity edema.  Imaging: No results found.  Labs:  CBC: Recent Labs    08/06/19 1250  WBC 3.7*  HGB 7.9*  HCT 24.2*  PLT 116*    COAGS: No results for input(s): INR, APTT in the last 8760 hours.  BMP: Recent Labs    01/04/19 1157 08/06/19 1250  NA 140 142  K 3.5 4.5  CL 99 105  CO2 26 25  GLUCOSE 111* 106*  BUN 23 25*  CALCIUM 8.7 9.5  CREATININE 1.67* 2.06*  GFRNONAA 40* 31*  GFRAA 46* 36*    LIVER FUNCTION TESTS: Recent Labs    08/06/19 1250  BILITOT 0.3  AST 12*  ALT 9  ALKPHOS 111  PROT 9.3*  ALBUMIN 3.3*    TUMOR MARKERS: No results for input(s): AFPTM, CEA, CA199, CHROMGRNA in the last 8760 hours.  Assessment and Plan: 73 y.o. male with history of gout, solitary kidney/chronic kidney disease and multiple myeloma (diagnosed and treated approx 10 yrs ago per pt).  Recent laboratory evaluation reveals elevated free kappa light chains and free kappa lambda ratio along with M spike/IgA monoclonal protein with kappa light chain specificity.  He presents today for CT-guided bone marrow biopsy for further evaluation/rule out relapse.Risks and benefits of procedure was discussed with the patient  including, but not limited to bleeding, infection, damage to adjacent structures or low yield requiring additional tests.  All of the questions were answered and there is agreement to proceed.  Consent signed and in chart.  LABS PENDING   Thank you for this interesting consult.  I greatly enjoyed meeting Mark Baird. and look forward to participating in their care.  A copy of this report was sent to the requesting provider on this date.  Electronically Signed: D. Rowe Robert, PA-C 08/14/2019, 9:08 AM   I spent a total of  20 minutes   in face to face in clinical consultation, greater than 50% of which was counseling/coordinating care for CT-guided bone marrow biopsy

## 2019-08-14 NOTE — Discharge Instructions (Addendum)
Emergency IR MD on call contact [phone]  May remove dressing and shower in 24 hours post procedure.  Do not submerge in water until scab formed. Keep site clean and dry and replace dressing with clean bandaid as needed.  Bone Marrow Aspiration and Bone Marrow Biopsy, Adult, Care After This sheet gives you information about how to care for yourself after your procedure. Your health care provider may also give you more specific instructions. If you have problems or questions, contact your health care provider. What can I expect after the procedure? After the procedure, it is common to have:  Mild pain and tenderness.  Swelling.  Bruising. Follow these instructions at home: Puncture site care Follow instructions from your health care provider about how to take care of the puncture site. Make sure you: ? Wash your hands with soap and water before you change your bandage (dressing). If soap and water are not available, use hand sanitizer. ? Change your dressing as told by your health care provider.  Check your puncture siteevery day for signs of infection. Check for: ? More redness, swelling, or pain. ? More fluid or blood. ? Warmth. ? Pus or a bad smell. General instructions  Take over-the-counter and prescription medicines only as told by your health care provider.  Do not take baths, swim, or use a hot tub until your health care provider approves. Ask if you can take a shower or have a sponge bath.  Return to your normal activities as told by your health care provider. Ask your health care provider what activities are safe for you.  Do not drive for 24 hours if you were given a medicine to help you relax (sedative) during your procedure.  Keep all follow-up visits as told by your health care provider. This is important. Contact a health care provider if:  Your pain is not controlled with medicine. Get help right away if:  You have a fever.  You have more redness,  swelling, or pain around the puncture site.  You have more fluid or blood coming from the puncture site.  Your puncture site feels warm to the touch.  You have pus or a bad smell coming from the puncture site. These symptoms may represent a serious problem that is an emergency. Do not wait to see if the symptoms will go away. Get medical help right away. Call your local emergency services (911 in the [place] Do not drive yourself to the hospital. Summary  After the procedure, it is common to have mild pain, tenderness, swelling, and bruising.  Follow instructions from your health care provider about how to take care of the puncture site.  Get help right away if you have any symptoms of infection or if you have more blood or fluid coming from the puncture site. This information is not intended to replace advice given to you by your health care provider. Make sure you discuss any questions you have with your health care provider. Document Released: 03/11/2005 Document Revised: 12/05/2017 Document Reviewed: 02/03/2016 Elsevier Patient Education  [place]. Moderate Conscious Sedation, Adult, Care After These instructions provide you with information about caring for yourself after your procedure. Your health care provider may also give you more specific instructions. Your treatment has been planned according to current medical practices, but problems sometimes occur. Call your health care provider if you have any problems or questions after your procedure. What can I expect after the procedure? After your procedure, it is common:  To  feel sleepy for several hours.  To feel clumsy and have poor balance for several hours.  To have poor judgment for several hours.  To vomit if you eat too soon. Follow these instructions at home: For at least 24 hours after the procedure:   Do not: ? Participate in activities where you could fall or become injured. ? Drive. ? Use heavy  machinery. ? Drink alcohol. ? Take sleeping pills or medicines that cause drowsiness. ? Make important decisions or sign legal documents. ? Take care of children on your own.  Rest. Eating and drinking  Follow the diet recommended by your health care provider.  If you vomit: ? Drink water, juice, or soup when you can drink without vomiting. ? Make sure you have little or no nausea before eating solid foods. General instructions  Have a responsible adult stay with you until you are awake and alert.  Take over-the-counter and prescription medicines only as told by your health care provider.  If you smoke, do not smoke without supervision.  Keep all follow-up visits as told by your health care provider. This is important. Contact a health care provider if:  You keep feeling nauseous or you keep vomiting.  You feel light-headed.  You develop a rash.  You have a fever. Get help right away if:  You have trouble breathing. This information is not intended to replace advice given to you by your health care provider. Make sure you discuss any questions you have with your health care provider. Document Released: 06/12/2013 Document Revised: 08/04/2017 Document Reviewed: 12/12/2015 Elsevier Patient Education  2020 [employer].

## 2019-08-14 NOTE — Procedures (Signed)
Interventional Radiology Procedure:   Indications:   Procedure: CT guided bone marrow biopsy  Findings: 2 aspirates and 1 core from right ilium  Complications: None     EBL: Minimal, less than 10 ml  Plan: Discharge to home in one hour.   Zia Najera R. Anselm Pancoast, MD  Pager: [phone]  Interventional Radiology Procedure:   Indications: Concern for myeloma relapse  Procedure: CT guided bone marrow biopsy  Findings: 2 aspirates and 1 core from right ilium  Complications: None     EBL: Minimal, less than 10 ml  Plan: Discharge to home in one hour.   Salik Grewell R. Anselm Pancoast, MD  Pager: [phone]

## 2019-08-14 NOTE — Telephone Encounter (Signed)
RN returned call to patient, no answer, voicemail box full.

## 2019-08-15 ENCOUNTER — Other Ambulatory Visit: Payer: Self-pay | Admitting: Hematology

## 2019-08-15 ENCOUNTER — Other Ambulatory Visit: Payer: Self-pay

## 2019-08-15 DIAGNOSIS — C9002 Multiple myeloma in relapse: Secondary | ICD-10-CM

## 2019-08-16 ENCOUNTER — Ambulatory Visit (HOSPITAL_COMMUNITY)
Admission: RE | Admit: 2019-08-16 | Discharge: 2019-08-16 | Disposition: A | Discharge status: Home / Self Care | Payer: PPO | Source: Ambulatory Visit | Attending: Hematology | Admitting: Hematology

## 2019-08-16 ENCOUNTER — Encounter (HOSPITAL_COMMUNITY): Payer: Self-pay

## 2019-08-16 ENCOUNTER — Ambulatory Visit (HOSPITAL_COMMUNITY): Admission: RE | Admit: 2019-08-16 | Payer: PPO | Source: Ambulatory Visit

## 2019-08-16 ENCOUNTER — Other Ambulatory Visit: Payer: Self-pay

## 2019-08-16 DIAGNOSIS — C9002 Multiple myeloma in relapse: Secondary | ICD-10-CM | POA: Insufficient documentation

## 2019-08-16 LAB — SURGICAL PATHOLOGY

## 2019-08-20 ENCOUNTER — Inpatient Hospital Stay: Payer: PPO | Admitting: Hematology

## 2019-08-20 ENCOUNTER — Telehealth: Payer: Self-pay | Admitting: Hematology

## 2019-08-20 NOTE — Telephone Encounter (Signed)
Scheduled appt per 12/15 sch message - pt is aware of appt date and time   

## 2019-08-23 ENCOUNTER — Ambulatory Visit (HOSPITAL_COMMUNITY)
Admission: RE | Admit: 2019-08-23 | Discharge: 2019-08-23 | Disposition: A | Discharge status: Home / Self Care | Payer: PPO | Source: Ambulatory Visit | Attending: Hematology | Admitting: Hematology

## 2019-08-23 ENCOUNTER — Other Ambulatory Visit: Payer: Self-pay

## 2019-08-23 DIAGNOSIS — R911 Solitary pulmonary nodule: Secondary | ICD-10-CM | POA: Insufficient documentation

## 2019-08-23 DIAGNOSIS — C9000 Multiple myeloma not having achieved remission: Secondary | ICD-10-CM | POA: Diagnosis not present

## 2019-08-23 DIAGNOSIS — C9002 Multiple myeloma in relapse: Secondary | ICD-10-CM | POA: Diagnosis not present

## 2019-08-23 LAB — GLUCOSE, CAPILLARY: Glucose-Capillary: 106 mg/dL — ABNORMAL HIGH (ref 70–99)

## 2019-08-23 MED ORDER — FLUDEOXYGLUCOSE F - 18 (FDG) INJECTION
11.1000 | Freq: Once | INTRAVENOUS | Status: AC | PRN
  Administered 2019-08-23: 11.1 via INTRAVENOUS

## 2019-08-26 ENCOUNTER — Encounter (HOSPITAL_COMMUNITY): Payer: Self-pay | Admitting: Hematology

## 2019-08-26 ENCOUNTER — Telehealth: Payer: Self-pay | Admitting: *Deleted

## 2019-08-26 NOTE — Telephone Encounter (Signed)
Results of PET scan done 12/18 printed and gave to Dr. Irene Limbo for review.

## 2019-08-27 ENCOUNTER — Other Ambulatory Visit: Payer: Self-pay

## 2019-08-27 ENCOUNTER — Inpatient Hospital Stay (HOSPITAL_BASED_OUTPATIENT_CLINIC_OR_DEPARTMENT_OTHER): Payer: PPO | Admitting: Hematology

## 2019-08-27 VITALS — BP 144/68 | HR 86 | Temp 98.7°F | Resp 18 | Ht 71.0 in | Wt 200.1 lb

## 2019-08-27 DIAGNOSIS — C9002 Multiple myeloma in relapse: Secondary | ICD-10-CM | POA: Diagnosis not present

## 2019-08-27 DIAGNOSIS — G939 Disorder of brain, unspecified: Secondary | ICD-10-CM | POA: Diagnosis not present

## 2019-08-27 DIAGNOSIS — C9000 Multiple myeloma not having achieved remission: Secondary | ICD-10-CM | POA: Diagnosis not present

## 2019-08-27 NOTE — Progress Notes (Signed)
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 is a wonderful 73 y.o. 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 The pt reports that he is doing well overall.  The pt reports that he has been seeing Mark Baird, Cardiologist, for a weakened heart muscle. 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 He has also concurrently been experiencing gout in both of his feet. 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 Pt states that he has been diagnosed with Multiple Myeloma but is unsure of what physician he saw or when the diagnosis was given. He believes the last time that he went to see a physician who was following his MM was about 6-7 years ago. He does remember being given chemotherapy and radiation to treat his Multiple Myeloma. There is some question about whether he had a bone marrow transplant.    Pt has a history of smoking and alcohol intake but he quit both about 30 years ago. He had no history of any heart rhythm issues. His current heart problems were first noticed about 2 years ago. Pt has not had any stents placed in his heart. Pt was born with one underdeveloped kidney and was never given any indication as to why the kidney was underdeveloped. He currently has CKD and sees Mark. Pearson Baird, Nephrologist, at [facility].   Most recent lab results (03/20/2019) of BMP is as follows: all values are WNL except for Glucose at 113, BUN at 47, Creatinine at 2.00, GFR Est Non Afr Am at 32, Chloride at 107. 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   On review of systems, pt reports knee pain and denies new bone pain, back pain, fevers, chills, night sweats and any other symptoms.   On PMHx the pt reports Gout, Multiple Myeloma, solitary kidney, CKD. On Social Hx the pt reports that he is previous cigarette smoker and EtOH drinker but quit 30 years ago.   INTERVAL HISTORY:   Mark Maciolek. is a wonderful 73 y.o. male who is here for evaluation and management of Multiple Myeloma. The patient's last visit with [place] was on 08/06/2019. The pt reports that he is doing well overall.  The pt reports that he saw Mark. Jana Hakim previously but stopped following up because he felt better. Pt has been having some increased fatigue lately. Pt used to be very active and played a fair amount of tennis but he can't think of doing so now due to how tired he is. He has not had any strokes to his knowledge and has not had any brain imaging previously. He denies any previous or present neuropathy or new bone pains.  Pt has not been seeing his PCP, Mark. Nancy Baird, regularly since his gout improved. He has a friend who is involved in his care and is his POA but no family involvement.   Of note since the patient's last visit, pt has had PET/CT ([id number]) completed on 08/23/2019  with results revealing "1. No evidence of active multiple myeloma on whole-body FDG PET scan. 2. Lytic lesion with sclerotic margins in the L3 vertebral body may represent treated myeloma lesion. 3. No evidence of plasmacytoma. 4. Soft tissue nodule in the superior RIGHT lung pleural space does not have associated metabolic activity. 5. Focal hypometabolic activity and CT hypodensity in the RIGHT frontal lobe. Differential include right frontal cortical infarction versus BRAIN MASS LESION. Recommend brain MRI with contrast."  Pt has had  FISH panel completed on 08/14/2019 with results revealing "No mutations detected".  Pt has had Cytogenetics completed on 08/14/2019 with results revealing "Normal Male Karyotype".  Lab results (08/06/19) of CBC w/diff and CMP is as follows: all values are WNL except for WBC at 3.7K, RBC at 2.42, Hgb at 7.9, Hematocrit at 24.2, PLT at 116K, Glucose at 106, BUN at 25, Creatinine at 2.06, Total Protein at 9.3, Albumin at 3.3, AST at 12, GFR Est Afr Am at 36. 08/06/2019 MMP is as follows: IgA at 2794, Total Protein at 8.7, B-Globulin at 3.5, M Protein at 2.7, Total Gloulin at 5.0 08/06/2019 K/L light chains is as follows: Kappa free light chain at 43.9, Lamda free light chains at 10.7, K/L light chain ratio at 4.10 08/06/2019 Beta-2 Microglobulin at 6.8 08/06/2019 LDH at 118  On review of systems, pt reports fatigue and denies new bone pains, neuropathy, abdominal pain, leg swelling and any other symptoms.   MEDICAL HISTORY:  Past Medical History:  Diagnosis Date  . Multiple myeloma ([place])   . Solitary kidney     SURGICAL HISTORY: Past Surgical History:  Procedure Laterality Date  . RIGHT/LEFT HEART CATH AND CORONARY ANGIOGRAPHY N/A 09/11/2017   Procedure: RIGHT/LEFT HEART CATH AND CORONARY ANGIOGRAPHY;  Surgeon: Nigel Mormon, MD;  Location: [place] [facility];  Service: Cardiovascular;  Laterality: N/A;    SOCIAL HISTORY: Social History   Socioeconomic History  . Marital status: Single    Spouse name: Not on file  . Number of children: 0  . Years of education: Not on file  . Highest education level: Not on file  Occupational History  . Not on file  Tobacco Use  . Smoking status: Former Smoker    Packs/day: 1.00    Years: 30.00    Pack years: 30.00    Types: Cigarettes    Quit date: 02/20/1989    Years since quitting: 30.5  . Smokeless tobacco: Never Used  Substance and Sexual Activity  . Alcohol use: No  . Drug use: Not on file  . Sexual activity: Not on file  Other  Topics Concern  . Not on file  Social History Narrative  . Not on file   Social Determinants of Health   Financial Resource Strain:   . Difficulty of Paying Living Expenses: Not on file  Food Insecurity:   . Worried About Charity fundraiser in the Last Year: Not on file  . Ran Out of Food in the Last Year: Not on file  Transportation Needs:   . Lack of Transportation (Medical): Not on file  . Lack of Transportation (Non-Medical): Not on file  Physical Activity:   . Days of Exercise per Week: Not on file  . Minutes of Exercise per Session: Not on file  Stress:   . Feeling of Stress : Not on file  Social Connections:   . Frequency of Communication with Friends and Family: Not on file  . Frequency of Social Gatherings with Friends  and Family: Not on file  . Attends Religious Services: Not on file  . Active Member of Clubs or Organizations: Not on file  . Attends Archivist Meetings: Not on file  . Marital Status: Not on file  Intimate Partner Violence:   . Fear of Current or Ex-Partner: Not on file  . Emotionally Abused: Not on file  . Physically Abused: Not on file  . Sexually Abused: Not on file    FAMILY HISTORY: Family History  Problem Relation Age of Onset  . Emphysema Father     ALLERGIES:  is allergic to molds & smuts and penicillins.  MEDICATIONS:  Current Outpatient Medications  Medication Sig Dispense Refill  . acetaminophen-codeine (TYLENOL #3) 300-30 MG tablet Take 1 tablet by mouth every 4 (four) hours as needed for moderate pain.    Marland Kitchen allopurinol (ZYLOPRIM) 300 MG tablet daily.    Marland Kitchen BIDIL 20-37.5 MG tablet TAKE 1/2 TABLET BY MOUTH TWICE DAILY 90 tablet 3  . potassium chloride SA (K-DUR) 20 MEQ tablet TAKE 2 TABLETS BY MOUTH DAILY 90 tablet 2  . pravastatin (PRAVACHOL) 20 MG tablet Take 20 mg by mouth daily.    Marland Kitchen torsemide (DEMADEX) 20 MG tablet Take 1 tablet (20 mg total) by mouth daily. 60 tablet 2  . warfarin (COUMADIN) 10 MG tablet TAKE 1  TABLET(10 MG) BY MOUTH DAILY 90 tablet 0  . metoprolol succinate (TOPROL-XL) 25 MG 24 hr tablet Take 1 tablet (25 mg total) by mouth daily. Take with or immediately following a meal. 30 tablet 3   No current facility-administered medications for this visit.    REVIEW OF SYSTEMS:   A 10+ POINT REVIEW OF SYSTEMS WAS OBTAINED including neurology, dermatology, psychiatry, cardiac, respiratory, lymph, extremities, GI, GU, Musculoskeletal, constitutional, breasts, reproductive, HEENT.  All pertinent positives are noted in the HPI.  All others are negative.   PHYSICAL EXAMINATION: ECOG PERFORMANCE STATUS: 2 - Symptomatic, <50% confined to bed  . Vitals:   08/27/19 1646  BP: (!) 144/68  Pulse: 86  Resp: 18  Temp: 98.7 F (37.1 C)  SpO2: 100%   Filed Weights   08/27/19 1646  Weight: 200 lb 1.6 oz (90.8 kg)   .Body mass index is 27.91 kg/m.  Exam was given in a chair   GENERAL:alert, in no acute distress and comfortable SKIN: no acute rashes, no significant lesions EYES: conjunctiva are pink and non-injected, sclera anicteric OROPHARYNX: MMM, no exudates, no oropharyngeal erythema or ulceration NECK: supple, no JVD LYMPH:  no palpable lymphadenopathy in the cervical, axillary or inguinal regions LUNGS: clear to auscultation b/l with normal respiratory effort HEART: regular rate & rhythm ABDOMEN:  normoactive bowel sounds , non tender, not distended. No palpable hepatosplenomegaly.  Extremity: no pedal edema PSYCH: alert & oriented x 3 with fluent speech NEURO: no focal motor/sensory deficits  LABORATORY DATA:  I have reviewed the data as listed  . CBC Latest Ref Rng & Units 08/14/2019 08/06/2019 06/28/2018  WBC 4.0 - 10.5 K/uL 3.2(L) 3.7(L) 4.3  Hemoglobin 13.0 - 17.0 g/dL 9.8(L) 7.9(L) 10.9(L)  Hematocrit 39.0 - 52.0 % 30.3(L) 24.2(L) 34.6(L)  Platelets 150 - 400 K/uL 125(L) 116(L) 157    . CMP Latest Ref Rng & Units 08/14/2019 08/06/2019 01/04/2019  Glucose 70 - 99 mg/dL  117(H) 106(H) 111(H)  BUN 8 - 23 mg/dL 48(H) 25(H) 23  Creatinine 0.61 - 1.24 mg/dL 2.06(H) 2.06(H) 1.67(H)  Sodium 135 - 145 mmol/L 141 142 140  Potassium 3.5 -  5.1 mmol/L 4.3 4.5 3.5  Chloride 98 - 111 mmol/L 106 105 99  CO2 22 - 32 mmol/L _0 Calcium 8.9 - 10.3 mg/dL 9.4 9.5 8.7  Total Protein 6.5 - 8.1 g/dL - 9.3(H) -  Total Bilirubin 0.3 - 1.2 mg/dL - 0.3 -  Alkaline Phos 38 - 126 U/L - 111 -  AST 15 - 41 U/L - 12(L) -  ALT 0 - 44 U/L - 9 -   08/14/2019 Cytogenetics:   12//05/2019 FISH Panel:       RADIOGRAPHIC STUDIES: I have personally reviewed the radiological images as listed and agreed with the findings in the report. NM PET Image Initial (PI) Whole Body  Result Date: 08/23/2019 CLINICAL DATA:  Initial treatment strategy for multiple myeloma. EXAM: NUCLEAR MEDICINE PET WHOLE BODY TECHNIQUE: 11.1 mCi F-18 FDG was injected intravenously. Full-ring PET imaging was performed from the skull base to thigh after the radiotracer. CT data was obtained and used for attenuation correction and anatomic localization. Fasting blood glucose: 106 mg/dl COMPARISON:  None FINDINGS: Mediastinal blood pool activity: SUV max 1.88 HEAD/NECK: Within the RIGHT frontal lobe there is a 2.7 x 2.9 cm ill-defined region of hypo density which corresponds to hypometabolism on comparison PET data set. (Image 23 on the CT exam and 22 on the fused data set.) No hypermetabolic cervical lymph nodes Incidental CT findings: None CHEST: Within the posterior RIGHT upper lobe there is a pleural based rounded nodule measuring 1.5 cm (image 89/4). There is no metabolic activity associated with this pleural nodule. No hypermetabolic mediastinal lymph nodes.  Soft tissue in lesion. Incidental CT findings: none ABDOMEN/PELVIS: No abnormal metabolic activity in the abdomen pelvis. Incidental CT findings: Solitary LEFT kidney. SKELETON: No focal hypermetabolic activity within the skeleton to suggest active multiple  myeloma. Lytic lesion in the L3 vertebral body with sclerotic margins and no metabolic activity. Incidental CT findings: No pathologic fracture EXTREMITIES: No abnormal hypermetabolic activity in the lower extremities. Incidental CT findings: none IMPRESSION: 1. No evidence of active multiple myeloma on whole-body FDG PET scan. 2. Lytic lesion with sclerotic margins in the L3 vertebral body may represent treated myeloma lesion. 3. No evidence of plasmacytoma. 4. Soft tissue nodule in the superior RIGHT lung pleural space does not have associated metabolic activity. 5. Focal hypometabolic activity and CT hypodensity in the RIGHT frontal lobe. Differential include right frontal cortical infarction versus BRAIN MASS LESION. Recommend brain MRI with contrast. These results will be called to the ordering clinician or representative by the Radiologist Assistant, and communication documented in the PACS or zVision Dashboard. Electronically Signed   By: Suzy Bouchard M.D.   On: 08/23/2019 16:49   CT Biopsy  Result Date: 08/14/2019 INDICATION: Concern for multiple myeloma relapse. EXAM: CT GUIDED BONE MARROW ASPIRATES AND BIOPSY Physician: Stephan Minister. Anselm Pancoast, MD MEDICATIONS: None. ANESTHESIA/SEDATION: Fentanyl 50 mcg IV; Versed 1.5 mg IV Moderate Sedation Time:  12 minutes The patient was continuously monitored during the procedure by the interventional radiology nurse under my direct supervision. COMPLICATIONS: None immediate. PROCEDURE: The procedure was explained to the patient. The risks and benefits of the procedure were discussed and the patient's questions were addressed. Informed consent was obtained from the patient. The patient was placed prone on CT table. Images of the pelvis were obtained. The right side of back was prepped and draped in sterile fashion. The skin and right posterior ilium were anesthetized with 1% lidocaine. 11 gauge bone needle was directed into the right  ilium with CT guidance. Two aspirates  and one core biopsy were obtained. Bandage placed over the puncture site. IMPRESSION: CT guided bone marrow aspiration and core biopsy. Electronically Signed   By: Markus Daft M.D.   On: 08/14/2019 12:35   CT BONE MARROW BIOPSY & ASPIRATION  Result Date: 08/14/2019 INDICATION: Concern for multiple myeloma relapse. EXAM: CT GUIDED BONE MARROW ASPIRATES AND BIOPSY Physician: Stephan Minister. Anselm Pancoast, MD MEDICATIONS: None. ANESTHESIA/SEDATION: Fentanyl 50 mcg IV; Versed 1.5 mg IV Moderate Sedation Time:  12 minutes The patient was continuously monitored during the procedure by the interventional radiology nurse under my direct supervision. COMPLICATIONS: None immediate. PROCEDURE: The procedure was explained to the patient. The risks and benefits of the procedure were discussed and the patient's questions were addressed. Informed consent was obtained from the patient. The patient was placed prone on CT table. Images of the pelvis were obtained. The right side of back was prepped and draped in sterile fashion. The skin and right posterior ilium were anesthetized with 1% lidocaine. 11 gauge bone needle was directed into the right ilium with CT guidance. Two aspirates and one core biopsy were obtained. Bandage placed over the puncture site. IMPRESSION: CT guided bone marrow aspiration and core biopsy. Electronically Signed   By: Markus Daft M.D.   On: 08/14/2019 12:35    ASSESSMENT & PLAN:   73 yo with   1) Relapsed Multiple Myeloma 2) Concern for cardiac Amyloidosis  3) ?brain lesion noted on PET/CT PLAN: -Discussed pt labwork, 08/06/19; all values are WNL except for WBC at 3.7K, RBC at 2.42, Hgb at 7.9, Hematocrit at 24.2, PLT at 116K, Glucose at 106, BUN at 25, Creatinine at 2.06, Total Protein at 9.3, Albumin at 3.3, AST at 12, GFR Est Afr Am at 36. -Discussed 08/06/2019 MMP is as follows: IgA at 2794, Total Protein at 8.7, B-Globulin at 3.5, M Protein at 2.7, Total Gloulin at 5.0 -Discussed 08/06/2019 K/L light  chains is as follows: Kappa free light chain at 43.9, Lamda free light chains at 10.7, K/L light chain ratio at 4.10 -Discussed 08/06/2019 Beta-2 Microglobulin at 6.8 -Discussed 08/06/2019 LDH is WNL at 118 -Discussed 08/14/2019 FISH panel which revealed "No mutations detected". -Discussed 08/14/2019 Cytogenetics which revealed "Normal Male Karyotype". -Discussed 08/23/2019 PET/CT ([id number]) which revealed "1. No evidence of active multiple myeloma on whole-body FDG PET scan. 2. Lytic lesion with sclerotic margins in the L3 vertebral body may represent treated myeloma lesion. 3. No evidence of plasmacytoma. 4. Soft tissue nodule in the superior RIGHT lung pleural space does not have associated metabolic activity. 5. Focal hypometabolic activity and CT hypodensity in the RIGHT frontal lobe. Differential include right frontal cortical infarction versus BRAIN MASS LESION. Recommend brain MRI with contrast." -Pt has Active Myeloma based on CRAB criteria: no hypercalcemia, renal insufficiency that can be otherwise explained, pt has anemia, no bone lesions identified. -Discussed beginning treatment for Multiple Myeloma vs Palliative care -Pt advised of the need for a cautious approach and increased difficulty of treatment due to multiple comorbidities  -Recommended pt to f/u with Mark Baird to maintain primary care f/u. -Recommended pt to f/u with Mark Baird for his cardiac amyloidosis -Recommended pt to f/u with Mark Baird as needed for CKD, solitary kidney -Will begin [place] in 1-2 weeks  -Will get a Brain CT I in 1 week -Will give 1 unit of PRBC in 1-2 days  FOLLOW UP: Brain CT in 5-7 days RTC with Mark Irene Limbo in 2 weeks  with labs  The total time spent in the appt was 40 minutes and more than 50% was on counseling and direct patient cares.  All of the patient's questions were answered with apparent satisfaction. The patient knows to call the clinic with any problems, questions or concerns.      Sullivan Lone MD [place] AAHIVMS [facility] Hematology/Oncology Physician [facility]  (Office):       [phone] (Work cell):  [phone] (Fax):           [phone]  08/29/2019 5:50 AM  I, Yevette Edwards, am acting as a scribe for Mark. Sullivan Lone.   .I have reviewed the above documentation for accuracy and completeness, and I agree with the above. Brunetta Genera MD

## 2019-08-28 ENCOUNTER — Telehealth: Payer: Self-pay | Admitting: Hematology

## 2019-08-28 NOTE — Telephone Encounter (Signed)
No los per 12/22. 

## 2019-09-02 ENCOUNTER — Ambulatory Visit: Payer: PPO | Admitting: Cardiology

## 2019-09-02 ENCOUNTER — Encounter: Payer: Self-pay | Admitting: Cardiology

## 2019-09-02 ENCOUNTER — Ambulatory Visit (INDEPENDENT_AMBULATORY_CARE_PROVIDER_SITE_OTHER): Payer: PPO | Admitting: Cardiology

## 2019-09-02 ENCOUNTER — Other Ambulatory Visit: Payer: Self-pay

## 2019-09-02 VITALS — BP 99/65 | HR 80 | Temp 98.7°F | Ht 71.0 in | Wt 196.3 lb

## 2019-09-02 DIAGNOSIS — I5022 Chronic systolic (congestive) heart failure: Secondary | ICD-10-CM | POA: Diagnosis not present

## 2019-09-02 NOTE — Progress Notes (Signed)
Subjective:   Mark Baird., male    DOB: Nov 22, 1945, 73 y.o.   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  Suspecting cardiac amyloidosis in [place] American man with nonischemic cardiomyopathy, PYP scan was ordered, but it does not appear that he had the test. Immune electrophoresis showed a faint band in IgA; light chain specificity could not be verified. Retesting in 4-6 months was recommended. I referred him to Hematology.  After patient related delay, patient finally aw Dr. Irene Limbo in December. There is concerning for recurrence of multiple myeloma, based on workup including bone marrow biopsy. There was also concern for a brain lesion seen on PET scan. Brain MRI was thus recommended, not performed yet.   Patient tells me that he had a long conversation with Dr. Irene Limbo and that he is considering starting chemotherapy. He does not have overt shortness of breath. Leg edema has stayed stable, predominantly in left leg. He was diagnosed with LLE DVT by PCP Dr. Nancy Fetter and has been on warfain wince then. He is very unsure about the dose he is taking, He has not had INR checked in a long time.   Past Medical History:  Diagnosis Date  . Multiple myeloma ([place])   . Solitary kidney       Past Surgical History:  Procedure Laterality Date  . RIGHT/LEFT HEART CATH AND CORONARY ANGIOGRAPHY N/A 09/11/2017   Procedure: RIGHT/LEFT HEART CATH AND CORONARY ANGIOGRAPHY;  Surgeon: Nigel Mormon, MD;  Location: [facility];  Service: Cardiovascular;  Laterality: N/A;     Current Outpatient Medications on File Prior to Visit  Medication Sig Dispense Refill  . acetaminophen-codeine (TYLENOL #3) 300-30 MG tablet Take 1 tablet by mouth every 4 (four) hours as needed for moderate pain.    Marland Kitchen allopurinol  (ZYLOPRIM) 300 MG tablet daily.    Marland Kitchen BIDIL 20-37.5 MG tablet TAKE 1/2 TABLET BY MOUTH TWICE DAILY (Patient taking differently: 1 tablet. ) 90 tablet 3  . metoprolol succinate (TOPROL-XL) 25 MG 24 hr tablet Take 1 tablet (25 mg total) by mouth daily. Take with or immediately following a meal. 30 tablet 3  . potassium chloride SA (K-DUR) 20 MEQ tablet TAKE 2 TABLETS BY MOUTH DAILY 90 tablet 2  . pravastatin (PRAVACHOL) 20 MG tablet Take 20 mg by mouth daily.    Marland Kitchen torsemide (DEMADEX) 20 MG tablet Take 1 tablet (20 mg total) by mouth daily. 60 tablet 2  . warfarin (COUMADIN) 10 MG tablet TAKE 1 TABLET(10 MG) BY MOUTH DAILY 90 tablet 0   No current facility-administered medications on file prior to visit.      Cardiovascular studies:  EKG 09/02/2019: Sinus  Rhythm  Left bundle branch block.  Left ventricular hypertrophy.  Echocardiogram 01/22/2019: Severely depressed LV systolic function with EF 20%. Left ventricle cavity is moderately dilated. Normal left ventricular wall thickness. Normal global wall motion. Doppler evidence of grade I (impaired) diastolic dysfunction, normal LAP. Calculated EF 20%. Left atrial cavity is moderately dilated at 4.7 cm. Moderate (Grade II) mitral regurgitation. Mild mitral valve leaflet thickening. IVC is dilated with respiratory variation. Estimated RA pressure 8 mmHg. Pulmonary hypertension seen on echocardiogram on 04/13/2017 not well appreciated on this study. No other significant change.   R/LHC 09/11/2017: No angiographically significant coronary artery disease Well compensated nonischemic cardiomyopathy  Echocardiogram 09/14/2017: 1. Left ventricle  cavity is normal in size. Moderate concentric hypertrophy of the left ventricle. Visual EF is 25-30%. Abnormal septal wall motion due to left bundle branch block. All other walls are moderate to severely hypokinetic. Doppler evidence of grade III (restrictive) diastolic dysfunction. Calculated EF  22%. 2. Left atrial cavity is normal in size. 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 3. Mild (Grade I) aortic regurgitation. 4. Mild (Grade I) mitral regurgitation. 5. Mild tricuspid regurgitation. No evidence of pulmonary hypertension.  Recent labs: 08/14/2019: Glucose 117, BUN/Cr 48/2.06. EGFR 36. Na/K 141/4.3. Total protein 9.3, albumin 3.3 H/H 9.8/30.3. MCV 100. Platelets 125    Review of Systems  Constitution: Negative for decreased appetite, malaise/fatigue, weight gain and weight loss.  HENT: Negative for congestion.   Eyes: Negative for visual disturbance.  Cardiovascular: Negative for chest pain, dyspnea on exertion (Improved), leg swelling, palpitations and syncope.  Respiratory: Negative for shortness of breath (Improved).   Endocrine: Negative for cold intolerance.  Hematologic/Lymphatic: Does not bruise/bleed easily.  Skin: Negative for itching and rash.  Musculoskeletal: Negative for myalgias.  Gastrointestinal: Negative for abdominal pain, nausea and vomiting.  Genitourinary: Negative for dysuria.  Neurological: Negative for dizziness and weakness.  Psychiatric/Behavioral: The patient is not nervous/anxious.   All other systems reviewed and are negative.       Vitals:   09/02/19 1403  BP: 99/65  Pulse: 80  Temp: 98.7 F (37.1 C)  SpO2: 96%     Physical Exam  Constitutional: He is oriented to person, place, and time. He appears well-developed and well-nourished. No distress.  HENT:  Head: Normocephalic and atraumatic.  Eyes: Pupils are equal, round, and reactive to light. Conjunctivae are normal.  Neck: No JVD present.  Cardiovascular: Normal rate, regular rhythm and intact distal pulses.  Pulmonary/Chest: Effort normal and breath sounds normal. He has no wheezes. He has no rales.  Abdominal: Soft. Bowel sounds are normal. There is no rebound.  Musculoskeletal:        General: Edema (1+ LLE) present.   Lymphadenopathy:    He has no cervical adenopathy.  Neurological: He is alert and oriented to person, place, and time. No cranial nerve deficit.  Skin: Skin is warm and dry.  Psychiatric: He has a normal mood and affect.  Nursing note and vitals reviewed.       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  HFrEF: NYHA class II-III. Nonischemic cardiomyopathy. Echocardiogram 01/2019-EF remains low around 20-25%.  He is tolerating low dose metoprolol succinate and Bidil without overt symptomatic hypotension.  Given his tenuous renal function and solitary kidney, he is not on ARNI/ACE/ARB.  Continue torsemide 20 mg daily without K, in light of his advanced CKD and normal K level.  Question remains whether he is nonischemic cardiomyopathy is due to cardiac amyloidosis.  He has significant monoclonal gammopathy, making this less likely to be ATTR-CM.  He recently underwent bone marrow biopsy for multiple myeloma, but did not have [place] red staining to evaluate for amyloid.  Clinically, he has done better than what would be expected of an AL amyloidosis patient.  He has tolerated low-dose beta-blocker without overt hypotension.  It has been nearly 2 years since his initial diagnosis of cardiomyopathy in January 2019.  Overall, my suspicion for this being he has amyloidosis is low.  Although about 5% of patients with multiple myeloma can have concurrent  amyloidosis, my clinical suspicion for this patient having AL amyloidosis is low.  Should he have AL amyloidosis, his treatment will mostly revolve around chemotherapy.  I would recommend him to continue close follow-up with Dr. Irene Limbo, is currently discussing treatment for his multiple myeloma.   In terms of cardiomyopathy management, he is relatively euvolemic on low-dose torsemide, BiDil, and metoprolol.  His  EF has not increased on tolerated guideline directed medical therapy.  Mild ICD would be recommended in this situation, I would like to update further information regarding prognosis and treatment plan for his multiple myeloma.  If his 1 year prognosis is not favorable, would not recommend ICD. Discussed low salt diet.   H/o DVT: On warfarin, as per PCP.  Seemingly unprovoked DVT.  INR today is 3.0.  It does not appear that he has regular follow-up with his PCP for warfarin dose adjustment.  Since his INR has been in range, I have not made any change.  I encouraged him to follow-up regularly with his PCP.  CKD3: Continue follow up with nephrology  Total time spent with patient was 40 minutes and greater than 50% of that time was spent in counseling and coordination care with the patient regarding complex decision making and discussion as state above.   F/u in 3 months  [place], MD [facility]. [place] Pager: [phone] Office: [phone] If no answer Cell [phone]

## 2019-09-02 NOTE — Patient Instructions (Signed)
Heart Failure Eating Plan Heart failure, also called congestive heart failure, occurs when your heart does not pump blood well enough to meet your body's needs for oxygen-rich blood. Heart failure is a long-term (chronic) condition. Living with heart failure can be challenging. However, following your health care provider's instructions about a healthy lifestyle and working with a diet and nutrition specialist (dietitian) to choose the right foods may help to improve your symptoms. What are tips for following this plan? Reading food labels  Check food labels for the amount of sodium per serving. Choose foods that have less than 140 mg (milligrams) of sodium in each serving.  Check food labels for the number of calories per serving. This is important if you need to limit your daily calorie intake to lose weight.  Check food labels for the serving size. If you eat more than one serving, you will be eating more sodium and calories than what is listed on the label.  Look for foods that are labeled as "sodium-free," "very low sodium," or "low sodium." ? Foods labeled as "reduced sodium" or "lightly salted" may still have more sodium than what is recommended for you. Cooking  Avoid adding salt when cooking. Ask your health care provider or dietitian before using salt substitutes.  Season food with salt-free seasonings, spices, or herbs. Check the label of seasoning mixes to make sure they do not contain salt.  Cook with heart-healthy oils, such as olive, canola, soybean, or sunflower oil.  Do not fry foods. Cook foods using low-fat methods, such as baking, boiling, grilling, and broiling.  Limit unhealthy fats when cooking by: ? Removing the skin from poultry, such as chicken. ? Removing all visible fats from meats. ? Skimming the fat off from stews, soups, and gravies before serving them. Meal planning   Limit your intake of: ? Processed, canned, or pre-packaged foods. ? Foods that are  high in trans fat, such as fried foods. ? Sweets, desserts, sugary drinks, and other foods with added sugar. ? Full-fat dairy products, such as whole milk.  Eat a balanced diet that includes: ? 4-5 servings of fruit each day and 4-5 servings of vegetables each day. At each meal, try to fill half of your plate with fruits and vegetables. ? Up to 6-8 servings of whole grains each day. ? Up to 2 servings of lean meat, poultry, or fish each day. One serving of meat is equal to 3 oz. This is about the same size as a deck of cards. ? 2 servings of low-fat dairy each day. ? Heart-healthy fats. Healthy fats called omega-3 fatty acids are found in foods such as flaxseed and cold-water fish like sardines, salmon, and mackerel.  Aim to eat 25-35 g (grams) of fiber a day. Foods that are high in fiber include apples, broccoli, carrots, beans, peas, and whole grains.  Do not add salt or condiments that contain salt (such as soy sauce) to foods before eating.  When eating at a restaurant, ask that your food be prepared with less salt or no salt, if possible.  Try to eat 2 or more vegetarian meals each week.  Eat more home-cooked food and eat less restaurant, buffet, and fast food. General information  Do not eat more than 2,300 mg of salt (sodium) a day. The amount of sodium that is recommended for you may be lower, depending on your condition.  Maintain a healthy body weight as directed. Ask your health care provider what a healthy weight is  for you. ? Check your weight every day. ? Work with your health care provider and dietitian to make a plan that is right for you to lose weight or maintain your current weight.  Limit how much fluid you drink. Ask your health care provider or dietitian how much fluid you can have each day.  Limit or avoid alcohol as told by your health care provider or dietitian. Recommended foods The items listed may not be a complete list. Talk with your dietitian about what  dietary choices are best for you. Fruits All fresh, frozen, and canned fruits. Dried fruits, such as raisins, prunes, and cranberries. Vegetables All fresh vegetables. Vegetables that are frozen without sauce or added salt. Low-sodium or sodium-free canned vegetables. Grains Bread with less than 80 mg of sodium per slice. Whole-wheat pasta, quinoa, and brown rice. Oats and oatmeal. Barley. [place]. Grits and cream of wheat. Whole-grain and whole-wheat cold cereal. Meats and other protein foods Lean cuts of meat. Skinless chicken and [place]. Fish with high omega-3 fatty acids, such as salmon, sardines, and other cold-water fishes. Eggs. Dried beans, peas, and edamame. Unsalted nuts and nut butters. Dairy Low-fat or nonfat (skim) milk and dried milk. Rice milk, soy milk, and almond milk. Low-fat or nonfat yogurt. Small amounts of reduced-sodium block cheese. Low-sodium cottage cheese. Fats and oils Olive, canola, soybean, flaxseed, or sunflower oil. Avocado. Sweets and desserts Apple sauce. Granola bars. Sugar-free pudding and gelatin. Frozen fruit bars. Seasoning and other foods Fresh and dried herbs. Lemon or lime juice. Vinegar. Low-sodium ketchup. Salt-free marinades, salad dressings, sauces, and seasonings. The items listed above may not be a complete list of foods and beverages you can eat. Contact a dietitian for more information. Foods to avoid The items listed may not be a complete list. Talk with your dietitian about what dietary choices are best for you. Fruits Fruits that are dried with sodium-containing preservatives. Vegetables Canned vegetables. Frozen vegetables with sauce or seasonings. Creamed vegetables. [place] fries. Onion rings. Pickled vegetables and sauerkraut. Grains Bread with more than 80 mg of sodium per slice. Hot or cold cereal with more than 140 mg sodium per serving. Salted pretzels and crackers. Pre-packaged breadcrumbs. Bagels, croissants, and biscuits. Meats  and other protein foods Ribs and chicken wings. Bacon, ham, pepperoni, bologna, salami, and packaged luncheon meats. Hot dogs, bratwurst, and sausage. Canned meat. Smoked meat and fish. Salted nuts and seeds. Dairy Whole milk, half-and-half, and cream. Buttermilk. Processed cheese, cheese spreads, and cheese curds. Regular cottage cheese. Feta cheese. Shredded cheese. String cheese. Fats and oils Butter, lard, shortening, ghee, and bacon fat. Canned and packaged gravies. Seasoning and other foods Onion salt, garlic salt, table salt, and sea salt. Marinades. Regular salad dressings. Relishes, pickles, and olives. Meat flavorings and tenderizers, and bouillon cubes. Horseradish, ketchup, and mustard. Worcestershire sauce. Teriyaki sauce, soy sauce (including reduced sodium). Hot sauce and Tabasco sauce. Steak sauce, fish sauce, oyster sauce, and cocktail sauce. Taco seasonings. Barbecue sauce. Tartar sauce. The items listed above may not be a complete list of foods and beverages you should avoid. Contact a dietitian for more information. Summary  A heart failure eating plan includes changes that limit your intake of sodium and unhealthy fat, and it may help you lose weight or maintain a healthy weight. Your health care provider may also recommend limiting how much fluid you drink.  Most people with heart failure should eat no more than 2,300 mg of salt (sodium) a day. The amount of sodium  that is recommended for you may be lower, depending on your condition.  Contact your health care provider or dietitian before making any major changes to your diet. This information is not intended to replace advice given to you by your health care provider. Make sure you discuss any questions you have with your health care provider. Document Released: 01/06/2017 Document Revised: 10/18/2018 Document Reviewed: 01/06/2017 Elsevier Patient Education  [place].

## 2019-09-03 ENCOUNTER — Telehealth: Payer: Self-pay | Admitting: Pharmacist

## 2019-09-03 MED ORDER — DEXAMETHASONE 4 MG PO TABS: 20.0000 mg | ORAL_TABLET | ORAL | 1 refills | Status: DC

## 2019-09-03 MED ORDER — IXAZOMIB CITRATE 3 MG PO CAPS: ORAL_CAPSULE | ORAL | 2 refills | Status: DC

## 2019-09-03 MED ORDER — ONDANSETRON HCL 8 MG PO TABS: 8.0000 mg | ORAL_TABLET | Freq: Three times a day (TID) | ORAL | 3 refills | Status: DC | PRN

## 2019-09-03 MED ORDER — ACYCLOVIR 200 MG PO CAPS: 200.0000 mg | ORAL_CAPSULE | Freq: Two times a day (BID) | ORAL | 1 refills | Status: DC

## 2019-09-03 NOTE — Telephone Encounter (Signed)
Oral Oncology Pharmacist Encounter  Received new prescription for Ninlaro (ixazomib) for the treatment of relapsed multiple myeloma in conjunction with dexamethasone, planned duration until disease progression or unacceptable drug toxicity.  CMP from 08/06/2019 and BMP from 08/14/2019 assessed, SCr elevated, CrCl ~13m/min. CMP lab ordered to be obtained at future lab appt once scheduled. His dose is already reduced to 315m which would be the recommendation if his CrCl was <3091min. Prescription dose and frequency assessed.   Current medication list in Epic reviewed, no DDIs with ixazomib identified.  [facility]  Oral Oncology Clinic will continue to follow for insurance authorization, copayment issues, initial counseling and start date.  AlyDarl PikesharmD, BCPS, BCO[facility] Servicesmatology/Oncology Clinical Pharmacist [phone]/29/2020 12:30 PM

## 2019-09-03 NOTE — Addendum Note (Signed)
Addended by: Rolanda Jay on: 09/03/2019 03:54 PM   Modules accepted: Orders

## 2019-09-11 ENCOUNTER — Ambulatory Visit (HOSPITAL_COMMUNITY)
Admission: RE | Admit: 2019-09-11 | Discharge: 2019-09-11 | Disposition: A | Discharge status: Home / Self Care | Payer: PPO | Source: Ambulatory Visit | Attending: Hematology | Admitting: Hematology

## 2019-09-11 ENCOUNTER — Other Ambulatory Visit: Payer: Self-pay

## 2019-09-11 ENCOUNTER — Encounter (HOSPITAL_COMMUNITY): Payer: Self-pay

## 2019-09-11 DIAGNOSIS — C9002 Multiple myeloma in relapse: Secondary | ICD-10-CM | POA: Insufficient documentation

## 2019-09-11 DIAGNOSIS — G939 Disorder of brain, unspecified: Secondary | ICD-10-CM | POA: Insufficient documentation

## 2019-09-12 ENCOUNTER — Telehealth: Payer: Self-pay

## 2019-09-12 NOTE — Telephone Encounter (Signed)
Oral Oncology Patient Advocate Encounter  Prior Authorization and appeal for Mark Baird has been approved.    PA# [id number] Effective dates: 09/12/19 through 09/11/20  Patients co-pay is $9.20  Oral Oncology Clinic will continue to follow.   [name] Phone [phone] Fax [phone] 09/12/2019 4:21 PM

## 2019-09-16 NOTE — Telephone Encounter (Signed)
Oral Chemotherapy Pharmacist Encounter  Appeal was approved for Mr. Mark Baird. Medication is scheduled for delivery on 09/18/19 by [facility]. Patient plans on getting started on Thursday 09/19/19.  Patient Education I spoke with patient for overview of new oral chemotherapy medication: Ninlaro (ixazomib) for the treatment of relapsed multiple myeloma in conjunction with dexamethasone, planned duration until disease progression or unacceptable drug toxicity.   Counseled patient on administration, dosing, side effects, monitoring, drug-food interactions, safe handling, storage, and disposal. Patient will take 1 capsule (3 mg) by mouth weekly, 3 weeks on, 1 week off, repeat every 4 weeks. Take on an empty stomach 1hr before or 2hr after meals.  Side effects include but not limited to: decreased plt/wbc, constipation or diarrhea  Reviewed with patient importance of keeping a medication schedule and plan for any missed doses.  Mark Baird voiced understanding and appreciation. All questions answered. Medication handout placed in the mail.  Provided patient with [facility] phone number. Patient knows to call the office with questions or concerns. Oral Chemotherapy Navigation Clinic will continue to follow.  Darl Pikes, PharmD, BCPS, [facility] Hematology/Oncology Clinical Pharmacist [facility] [place] [facility] [phone]  09/16/2019 3:53 PM

## 2019-09-17 MED FILL — NINLARO 3 MG CAPS: 3 | 28 days supply | Qty: 3 | Fill #0

## 2019-09-20 ENCOUNTER — Telehealth: Payer: Self-pay | Admitting: Pharmacist

## 2019-09-20 ENCOUNTER — Telehealth: Payer: Self-pay | Admitting: Hematology

## 2019-09-20 NOTE — Telephone Encounter (Signed)
Oral Chemotherapy Pharmacist Encounter   Patient struggled with getting his first dose of Ninlaro out of the blister packaging and threw it away because he stated he damaged it getting it out of the package. He stopped by [facility] and the pharmacist there reviewed with him how to remove the medication from the blister packing. He returned home and attempted to remove a second dose from the packaging. He call me to let me know he threw the second dose away because he messed up the capsule getting out of the packaging again. He called me wanting to know what to do. I instructed him to hold off on attempting to take the third capsule.  Called Dr. Irene Limbo and let him know about the issues Mark Baird is having with taking his medication. Dr. Irene Limbo plans on bring him in for an appt and evaluating whether to continue to attempt the Ninlaro at home or switch to a clinic administered regimen.  Darl Pikes, PharmD, BCPS, [facility] Hematology/Oncology Clinical Pharmacist [facility] Oral [place] [facility] [phone]  09/20/2019 12:50 PM

## 2019-09-20 NOTE — Telephone Encounter (Signed)
Scheduled appt per 1/15 sch message - unable to reach pt and unable to leave message. Message sent to RN so she is aware

## 2019-09-20 NOTE — Telephone Encounter (Signed)
Send scheduling message to bring him back to clinic in 1 week with labs.  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 We will have to discuss goals of care again and treatment planning.

## 2019-09-23 DIAGNOSIS — Z79899 Other long term (current) drug therapy: Secondary | ICD-10-CM | POA: Diagnosis not present

## 2019-09-23 DIAGNOSIS — I82412 Acute embolism and thrombosis of left femoral vein: Secondary | ICD-10-CM | POA: Diagnosis not present

## 2019-09-23 DIAGNOSIS — E7800 Pure hypercholesterolemia, unspecified: Secondary | ICD-10-CM | POA: Diagnosis not present

## 2019-09-23 DIAGNOSIS — M109 Gout, unspecified: Secondary | ICD-10-CM | POA: Diagnosis not present

## 2019-09-23 DIAGNOSIS — R7303 Prediabetes: Secondary | ICD-10-CM | POA: Diagnosis not present

## 2019-09-23 DIAGNOSIS — M25562 Pain in left knee: Secondary | ICD-10-CM | POA: Diagnosis not present

## 2019-09-23 DIAGNOSIS — M129 Arthropathy, unspecified: Secondary | ICD-10-CM | POA: Diagnosis not present

## 2019-09-23 DIAGNOSIS — I10 Essential (primary) hypertension: Secondary | ICD-10-CM | POA: Diagnosis not present

## 2019-09-23 DIAGNOSIS — Z1159 Encounter for screening for other viral diseases: Secondary | ICD-10-CM | POA: Diagnosis not present

## 2019-09-25 ENCOUNTER — Telehealth: Payer: Self-pay | Admitting: Hematology

## 2019-09-25 ENCOUNTER — Telehealth: Payer: Self-pay | Admitting: *Deleted

## 2019-09-25 NOTE — Telephone Encounter (Signed)
Returned patient's phone call regarding cancelling 01/21 appointment, per patient's request appointment has been cancelled due to patient having gout. Patient will call back when ready to reschedule.

## 2019-09-25 NOTE — Telephone Encounter (Signed)
Contacted patient to confirm his appts on 1/21 at [facility] - lab 8:15 and Dr.Kale 8:40. Patient states understanding of times.

## 2019-09-26 ENCOUNTER — Inpatient Hospital Stay: Payer: PPO | Admitting: Hematology

## 2019-09-26 ENCOUNTER — Inpatient Hospital Stay: Payer: PPO

## 2019-10-02 ENCOUNTER — Emergency Department (HOSPITAL_COMMUNITY): Payer: PPO

## 2019-10-02 ENCOUNTER — Inpatient Hospital Stay (HOSPITAL_COMMUNITY)
Admission: EM | Admit: 2019-10-02 | Discharge: 2019-10-10 | DRG: 640 | Disposition: A | Discharge status: Hospice | Payer: PPO | Attending: Internal Medicine | Admitting: Internal Medicine

## 2019-10-02 ENCOUNTER — Encounter (HOSPITAL_COMMUNITY): Payer: Self-pay | Admitting: Neurology

## 2019-10-02 DIAGNOSIS — E870 Hyperosmolality and hypernatremia: Principal | ICD-10-CM | POA: Diagnosis present

## 2019-10-02 DIAGNOSIS — N183 Chronic kidney disease, stage 3 unspecified: Secondary | ICD-10-CM | POA: Diagnosis present

## 2019-10-02 DIAGNOSIS — S299XXA Unspecified injury of thorax, initial encounter: Secondary | ICD-10-CM | POA: Diagnosis not present

## 2019-10-02 DIAGNOSIS — Z20822 Contact with and (suspected) exposure to covid-19: Secondary | ICD-10-CM | POA: Diagnosis present

## 2019-10-02 DIAGNOSIS — N179 Acute kidney failure, unspecified: Secondary | ICD-10-CM | POA: Diagnosis not present

## 2019-10-02 DIAGNOSIS — G92 Toxic encephalopathy: Secondary | ICD-10-CM | POA: Diagnosis not present

## 2019-10-02 DIAGNOSIS — Z79899 Other long term (current) drug therapy: Secondary | ICD-10-CM

## 2019-10-02 DIAGNOSIS — D630 Anemia in neoplastic disease: Secondary | ICD-10-CM | POA: Diagnosis present

## 2019-10-02 DIAGNOSIS — R339 Retention of urine, unspecified: Secondary | ICD-10-CM | POA: Diagnosis present

## 2019-10-02 DIAGNOSIS — Z880 Allergy status to penicillin: Secondary | ICD-10-CM

## 2019-10-02 DIAGNOSIS — M25552 Pain in left hip: Secondary | ICD-10-CM | POA: Diagnosis not present

## 2019-10-02 DIAGNOSIS — R627 Adult failure to thrive: Secondary | ICD-10-CM | POA: Diagnosis not present

## 2019-10-02 DIAGNOSIS — R739 Hyperglycemia, unspecified: Secondary | ICD-10-CM | POA: Diagnosis present

## 2019-10-02 DIAGNOSIS — M109 Gout, unspecified: Secondary | ICD-10-CM | POA: Diagnosis present

## 2019-10-02 DIAGNOSIS — I491 Atrial premature depolarization: Secondary | ICD-10-CM | POA: Diagnosis not present

## 2019-10-02 DIAGNOSIS — D631 Anemia in chronic kidney disease: Secondary | ICD-10-CM | POA: Diagnosis not present

## 2019-10-02 DIAGNOSIS — Z87891 Personal history of nicotine dependence: Secondary | ICD-10-CM

## 2019-10-02 DIAGNOSIS — I4891 Unspecified atrial fibrillation: Secondary | ICD-10-CM | POA: Diagnosis not present

## 2019-10-02 DIAGNOSIS — E860 Dehydration: Secondary | ICD-10-CM | POA: Diagnosis not present

## 2019-10-02 DIAGNOSIS — C9002 Multiple myeloma in relapse: Secondary | ICD-10-CM | POA: Diagnosis present

## 2019-10-02 DIAGNOSIS — I130 Hypertensive heart and chronic kidney disease with heart failure and stage 1 through stage 4 chronic kidney disease, or unspecified chronic kidney disease: Secondary | ICD-10-CM | POA: Diagnosis not present

## 2019-10-02 DIAGNOSIS — S3993XA Unspecified injury of pelvis, initial encounter: Secondary | ICD-10-CM | POA: Diagnosis not present

## 2019-10-02 DIAGNOSIS — M25462 Effusion, left knee: Secondary | ICD-10-CM | POA: Diagnosis not present

## 2019-10-02 DIAGNOSIS — S4991XA Unspecified injury of right shoulder and upper arm, initial encounter: Secondary | ICD-10-CM | POA: Diagnosis not present

## 2019-10-02 DIAGNOSIS — I82402 Acute embolism and thrombosis of unspecified deep veins of left lower extremity: Secondary | ICD-10-CM | POA: Diagnosis present

## 2019-10-02 DIAGNOSIS — E872 Acidosis: Secondary | ICD-10-CM | POA: Diagnosis present

## 2019-10-02 DIAGNOSIS — R531 Weakness: Secondary | ICD-10-CM

## 2019-10-02 DIAGNOSIS — D696 Thrombocytopenia, unspecified: Secondary | ICD-10-CM | POA: Diagnosis not present

## 2019-10-02 DIAGNOSIS — R509 Fever, unspecified: Secondary | ICD-10-CM | POA: Diagnosis not present

## 2019-10-02 DIAGNOSIS — R404 Transient alteration of awareness: Secondary | ICD-10-CM | POA: Diagnosis not present

## 2019-10-02 DIAGNOSIS — Z515 Encounter for palliative care: Secondary | ICD-10-CM | POA: Diagnosis not present

## 2019-10-02 DIAGNOSIS — R569 Unspecified convulsions: Secondary | ICD-10-CM | POA: Diagnosis not present

## 2019-10-02 DIAGNOSIS — D61818 Other pancytopenia: Secondary | ICD-10-CM | POA: Diagnosis not present

## 2019-10-02 DIAGNOSIS — S79912A Unspecified injury of left hip, initial encounter: Secondary | ICD-10-CM | POA: Diagnosis not present

## 2019-10-02 DIAGNOSIS — J189 Pneumonia, unspecified organism: Secondary | ICD-10-CM | POA: Diagnosis present

## 2019-10-02 DIAGNOSIS — C9000 Multiple myeloma not having achieved remission: Secondary | ICD-10-CM | POA: Diagnosis not present

## 2019-10-02 DIAGNOSIS — G928 Other toxic encephalopathy: Secondary | ICD-10-CM

## 2019-10-02 DIAGNOSIS — I5042 Chronic combined systolic (congestive) and diastolic (congestive) heart failure: Secondary | ICD-10-CM | POA: Diagnosis present

## 2019-10-02 DIAGNOSIS — Z03818 Encounter for observation for suspected exposure to other biological agents ruled out: Secondary | ICD-10-CM | POA: Diagnosis not present

## 2019-10-02 DIAGNOSIS — N189 Chronic kidney disease, unspecified: Secondary | ICD-10-CM | POA: Diagnosis not present

## 2019-10-02 DIAGNOSIS — R4182 Altered mental status, unspecified: Secondary | ICD-10-CM

## 2019-10-02 DIAGNOSIS — R0902 Hypoxemia: Secondary | ICD-10-CM | POA: Diagnosis not present

## 2019-10-02 DIAGNOSIS — M25562 Pain in left knee: Secondary | ICD-10-CM | POA: Diagnosis not present

## 2019-10-02 DIAGNOSIS — N19 Unspecified kidney failure: Secondary | ICD-10-CM

## 2019-10-02 DIAGNOSIS — R410 Disorientation, unspecified: Secondary | ICD-10-CM | POA: Diagnosis not present

## 2019-10-02 DIAGNOSIS — Z66 Do not resuscitate: Secondary | ICD-10-CM | POA: Diagnosis not present

## 2019-10-02 DIAGNOSIS — I428 Other cardiomyopathies: Secondary | ICD-10-CM | POA: Diagnosis present

## 2019-10-02 DIAGNOSIS — I499 Cardiac arrhythmia, unspecified: Secondary | ICD-10-CM | POA: Diagnosis not present

## 2019-10-02 DIAGNOSIS — M79605 Pain in left leg: Secondary | ICD-10-CM | POA: Diagnosis not present

## 2019-10-02 DIAGNOSIS — Z7901 Long term (current) use of anticoagulants: Secondary | ICD-10-CM

## 2019-10-02 DIAGNOSIS — Z825 Family history of asthma and other chronic lower respiratory diseases: Secondary | ICD-10-CM

## 2019-10-02 DIAGNOSIS — Z8673 Personal history of transient ischemic attack (TIA), and cerebral infarction without residual deficits: Secondary | ICD-10-CM

## 2019-10-02 HISTORY — DX: Chronic kidney disease, unspecified: N18.9

## 2019-10-02 HISTORY — DX: Acute embolism and thrombosis of unspecified deep veins of unspecified lower extremity: I82.409

## 2019-10-02 LAB — URINALYSIS, ROUTINE W REFLEX MICROSCOPIC
Bilirubin Urine: NEGATIVE
Glucose, UA: NEGATIVE mg/dL
Ketones, ur: NEGATIVE mg/dL
Nitrite: NEGATIVE
Protein, ur: 30 mg/dL — AB
Specific Gravity, Urine: 1.016 (ref 1.005–1.030)
pH: 5 (ref 5.0–8.0)

## 2019-10-02 LAB — CBC WITH DIFFERENTIAL/PLATELET
Abs Immature Granulocytes: 0.15 10*3/uL — ABNORMAL HIGH (ref 0.00–0.07)
Basophils Absolute: 0 10*3/uL (ref 0.0–0.1)
Basophils Relative: 0 %
Eosinophils Absolute: 0 10*3/uL (ref 0.0–0.5)
Eosinophils Relative: 0 %
HCT: 31.1 % — ABNORMAL LOW (ref 39.0–52.0)
Hemoglobin: 9.4 g/dL — ABNORMAL LOW (ref 13.0–17.0)
Immature Granulocytes: 2 %
Lymphocytes Relative: 9 %
Lymphs Abs: 0.8 10*3/uL (ref 0.7–4.0)
MCH: 31.4 pg (ref 26.0–34.0)
MCHC: 30.2 g/dL (ref 30.0–36.0)
MCV: 104 fL — ABNORMAL HIGH (ref 80.0–100.0)
Monocytes Absolute: 0.5 10*3/uL (ref 0.1–1.0)
Monocytes Relative: 6 %
Neutro Abs: 7.3 10*3/uL (ref 1.7–7.7)
Neutrophils Relative %: 83 %
Platelets: 76 10*3/uL — ABNORMAL LOW (ref 150–400)
RBC: 2.99 MIL/uL — ABNORMAL LOW (ref 4.22–5.81)
RDW: 14.3 % (ref 11.5–15.5)
WBC: 8.8 10*3/uL (ref 4.0–10.5)
nRBC: 0 % (ref 0.0–0.2)

## 2019-10-02 LAB — COMPREHENSIVE METABOLIC PANEL
ALT: 43 U/L (ref 0–44)
AST: 30 U/L (ref 15–41)
Albumin: 2.6 g/dL — ABNORMAL LOW (ref 3.5–5.0)
Alkaline Phosphatase: 96 U/L (ref 38–126)
Anion gap: 14 (ref 5–15)
BUN: 136 mg/dL — ABNORMAL HIGH (ref 8–23)
CO2: 19 mmol/L — ABNORMAL LOW (ref 22–32)
Calcium: 9.4 mg/dL (ref 8.9–10.3)
Chloride: 129 mmol/L — ABNORMAL HIGH (ref 98–111)
Creatinine, Ser: 4.67 mg/dL — ABNORMAL HIGH (ref 0.61–1.24)
GFR calc Af Amer: 13 mL/min — ABNORMAL LOW (ref >60)
GFR calc non Af Amer: 12 mL/min — ABNORMAL LOW (ref >60)
Glucose, Bld: 218 mg/dL — ABNORMAL HIGH (ref 70–99)
Potassium: 4.4 mmol/L (ref 3.5–5.1)
Sodium: 162 mmol/L (ref 135–145)
Total Bilirubin: 1.5 mg/dL — ABNORMAL HIGH (ref 0.3–1.2)
Total Protein: 10.5 g/dL — ABNORMAL HIGH (ref 6.5–8.1)

## 2019-10-02 LAB — HEMOGLOBIN A1C
Hgb A1c MFr Bld: 6.3 % — ABNORMAL HIGH (ref 4.8–5.6)
Mean Plasma Glucose: 134.11 mg/dL

## 2019-10-02 LAB — BASIC METABOLIC PANEL
Anion gap: 15 (ref 5–15)
BUN: 140 mg/dL — ABNORMAL HIGH (ref 8–23)
CO2: 19 mmol/L — ABNORMAL LOW (ref 22–32)
Calcium: 9.6 mg/dL (ref 8.9–10.3)
Chloride: 130 mmol/L — ABNORMAL HIGH (ref 98–111)
Creatinine, Ser: 4.54 mg/dL — ABNORMAL HIGH (ref 0.61–1.24)
GFR calc Af Amer: 14 mL/min — ABNORMAL LOW (ref >60)
GFR calc non Af Amer: 12 mL/min — ABNORMAL LOW (ref >60)
Glucose, Bld: 208 mg/dL — ABNORMAL HIGH (ref 70–99)
Potassium: 4.6 mmol/L (ref 3.5–5.1)
Sodium: 164 mmol/L (ref 135–145)

## 2019-10-02 LAB — RESPIRATORY PANEL BY RT PCR (FLU A&B, COVID)
Influenza A by PCR: NEGATIVE
Influenza B by PCR: NEGATIVE
SARS Coronavirus 2 by RT PCR: NEGATIVE

## 2019-10-02 LAB — PROTIME-INR
INR: 1.6 — ABNORMAL HIGH (ref 0.8–1.2)
Prothrombin Time: 19.2 seconds — ABNORMAL HIGH (ref 11.4–15.2)

## 2019-10-02 LAB — AMMONIA: Ammonia: 11 umol/L (ref 9–35)

## 2019-10-02 LAB — VITAMIN B12: Vitamin B-12: 380 pg/mL (ref 180–914)

## 2019-10-02 LAB — OSMOLALITY: Osmolality: 406 mOsm/kg (ref 275–295)

## 2019-10-02 LAB — PROCALCITONIN: Procalcitonin: 0.71 ng/mL

## 2019-10-02 LAB — CBG MONITORING, ED
Glucose-Capillary: 193 mg/dL — ABNORMAL HIGH (ref 70–99)
Glucose-Capillary: 185 mg/dL — ABNORMAL HIGH (ref 70–99)

## 2019-10-02 LAB — CK: Total CK: 470 U/L — ABNORMAL HIGH (ref 49–397)

## 2019-10-02 LAB — OSMOLALITY, URINE: Osmolality, Ur: 566 mOsm/kg (ref 300–900)

## 2019-10-02 LAB — TSH: TSH: 0.32 u[IU]/mL — ABNORMAL LOW (ref 0.350–4.500)

## 2019-10-02 MED ORDER — ACETAMINOPHEN 325 MG PO TABS
650.0000 mg | ORAL_TABLET | Freq: Four times a day (QID) | ORAL | Status: DC | PRN
  Filled 2019-10-02: qty 2

## 2019-10-02 MED ORDER — WARFARIN - PHARMACIST DOSING INPATIENT: Freq: Every day | Status: DC

## 2019-10-02 MED ORDER — WARFARIN SODIUM 10 MG PO TABS
10.0000 mg | ORAL_TABLET | Freq: Once | ORAL | Status: DC
  Filled 2019-10-02: qty 1

## 2019-10-02 MED ORDER — ACETAMINOPHEN 650 MG RE SUPP
650.0000 mg | Freq: Four times a day (QID) | RECTAL | Status: DC | PRN
  Administered 2019-10-04 – 2019-10-06 (×5): 650 mg via RECTAL
  Filled 2019-10-02 (×6): qty 1

## 2019-10-02 MED ORDER — DEXTROSE 5 % IV SOLN: INTRAVENOUS | Status: DC

## 2019-10-02 MED ORDER — INSULIN ASPART 100 UNIT/ML ~~LOC~~ SOLN
0.0000 [IU] | SUBCUTANEOUS | Status: DC
  Administered 2019-10-02 – 2019-10-03 (×2): 1 [IU] via SUBCUTANEOUS
  Administered 2019-10-03 (×3): 2 [IU] via SUBCUTANEOUS
  Administered 2019-10-03: 1 [IU] via SUBCUTANEOUS
  Administered 2019-10-03 – 2019-10-04 (×3): 2 [IU] via SUBCUTANEOUS
  Administered 2019-10-04: 1 [IU] via SUBCUTANEOUS
  Administered 2019-10-04 (×2): 2 [IU] via SUBCUTANEOUS
  Administered 2019-10-04 – 2019-10-05 (×2): 1 [IU] via SUBCUTANEOUS
  Administered 2019-10-05: 2 [IU] via SUBCUTANEOUS
  Administered 2019-10-05 (×3): 1 [IU] via SUBCUTANEOUS
  Administered 2019-10-05: 2 [IU] via SUBCUTANEOUS
  Administered 2019-10-06: 1 [IU] via SUBCUTANEOUS
  Administered 2019-10-06: 2 [IU] via SUBCUTANEOUS
  Administered 2019-10-06: 1 [IU] via SUBCUTANEOUS
  Administered 2019-10-06 (×3): 2 [IU] via SUBCUTANEOUS
  Administered 2019-10-07: 05:00:00 1 [IU] via SUBCUTANEOUS
  Administered 2019-10-07 (×2): 2 [IU] via SUBCUTANEOUS

## 2019-10-02 NOTE — ED Provider Notes (Signed)
[place] EMERGENCY DEPARTMENT Provider Note   CSN: [id number] Arrival date & time: 10/02/19  1427     History Chief Complaint  Patient presents with  . Altered Mental Status    Mark Baird. is a 74 y.o. male.   Altered Mental Status  Level 5 caveat due to altered mental status. Patient presents with altered mental status.  Last known well was couple days ago.  Normally alert and oriented x4.  Found in wellness check and found to be laying half on half off the floor with decreased responsiveness.  Now cannot tell me his name.  Reportedly had shaking for EMS and was given 2.5 of Versed.  Reportedly tremors in mouth and hands.  Decreased responsiveness now.  Patient really cannot provide any history.     Past Medical History:  Diagnosis Date  . Chronic kidney disease (CKD)   . DVT (deep venous thrombosis) ([place])   . Multiple myeloma ([place])   . Solitary kidney     Patient Active Problem List   Diagnosis Date Noted  . CKD (chronic kidney disease) stage 3, GFR 30-59 ml/min 12/31/2018  . Acute exacerbation of CHF (congestive heart failure) ([place]) 06/30/2018  . Chronic renal insufficiency 06/29/2018  . Chest pain 06/29/2018  . Cardiac arrhythmia 06/29/2018  . Normocytic anemia 06/29/2018  . Chronic systolic heart failure ([place]) 09/10/2017  . Acute on chronic combined systolic and diastolic CHF (congestive heart failure) ([place]) 06/25/2017  . Shortness of breath 06/25/2017    Past Surgical History:  Procedure Laterality Date  . RIGHT/LEFT HEART CATH AND CORONARY ANGIOGRAPHY N/A 09/11/2017   Procedure: RIGHT/LEFT HEART CATH AND CORONARY ANGIOGRAPHY;  Surgeon: Nigel Mormon, MD;  Location: [facility];  Service: Cardiovascular;  Laterality: N/A;       Family History  Problem Relation Age of Onset  . Emphysema Father     Social History   Tobacco Use  . Smoking status: Former Smoker    Packs/day: 1.00    Years: 30.00    Pack years:  30.00    Types: Cigarettes    Quit date: 02/20/1989    Years since quitting: 30.6  . Smokeless tobacco: Never Used  Substance Use Topics  . Alcohol use: No  . Drug use: Not on file    Home Medications Prior to Admission medications   Medication Sig Start Date End Date Taking? Authorizing Provider  acetaminophen-codeine (TYLENOL #3) 300-30 MG tablet Take 1 tablet by mouth every 4 (four) hours as needed for moderate pain. 09/24/18   Lucianne Lei, MD  acyclovir (ZOVIRAX) 200 MG capsule Take 1 capsule (200 mg total) by mouth 2 (two) times daily. 09/03/19   Brunetta Genera, MD  allopurinol (ZYLOPRIM) 300 MG tablet Take 300 mg by mouth daily.  01/29/19   [provider]  BIDIL 20-37.5 MG tablet TAKE 1/2 TABLET BY MOUTH TWICE DAILY Patient taking differently: 1 tablet.  07/29/19   Patwardhan, Reynold Bowen, MD  dexamethasone (DECADRON) 4 MG tablet Take 5 tablets (20 mg total) by mouth once a week. 09/03/19   Brunetta Genera, MD  ixazomib citrate (NINLARO) 3 MG capsule Take 1 capsule (3 mg) by mouth weekly, 3 weeks on, 1 week off, repeat every 4 weeks. Take on an empty stomach 1hr before or 2hr after meals. 09/03/19   Brunetta Genera, MD  metoprolol succinate (TOPROL-XL) 25 MG 24 hr tablet Take 1 tablet (25 mg total) by mouth daily. Take with or  immediately following a meal. 04/22/19 09/02/19  Patwardhan, Manish J, MD  ondansetron (ZOFRAN) 8 MG tablet Take 1 tablet (8 mg total) by mouth every 8 (eight) hours as needed for nausea. 09/03/19   Brunetta Genera, MD  pravastatin (PRAVACHOL) 20 MG tablet Take 20 mg by mouth daily.    [provider]  torsemide (DEMADEX) 20 MG tablet Take 1 tablet (20 mg total) by mouth daily. 04/22/19   Patwardhan, Reynold Bowen, MD  warfarin (COUMADIN) 10 MG tablet TAKE 1 TABLET(10 MG) BY MOUTH DAILY 08/05/19   Patwardhan, Manish J, MD    Allergies    Molds & smuts and Penicillins  Review of Systems   Review of Systems  Unable to perform ROS:  Mental status change    Physical Exam Updated Vital Signs BP 139/62   Pulse (!) 54   Temp 98.7 F (37.1 C) (Axillary)   Resp (!) 24   SpO2 96%   Physical Exam Vitals reviewed.  HENT:     Head: Atraumatic.     Mouth/Throat:     Mouth: Mucous membranes are dry.  Eyes:     Comments: Eyes looking mostly straightahead.  Will look to left a little bit.  Will not look to right.  Cardiovascular:     Rate and Rhythm: Regular rhythm.  Pulmonary:     Comments: Tenderness over right sternoclavicular joint with swelling. Chest:     Chest wall: Tenderness present.  Abdominal:     Tenderness: There is no abdominal tenderness.  Musculoskeletal:     Comments: Right lower extremity externally rotated with tenderness over left hip.  Decreased range of motion in hip.  Skin:    Capillary Refill: Capillary refill takes less than 2 seconds.  Neurological:     Comments: Will possibly squeeze hands to command.  Eyes will not look to right but mostly looks straight ahead.  Nonverbal.  Withdraws from pain bilaterally.     ED Results / Procedures / Treatments   Labs (all labs ordered are listed, but only abnormal results are displayed) Labs Reviewed  PROTIME-INR - Abnormal; Notable for the following components:      Result Value   Prothrombin Time 19.2 (*)    INR 1.6 (*)    All other components within normal limits  CBC WITH DIFFERENTIAL/PLATELET - Abnormal; Notable for the following components:   RBC 2.99 (*)    Hemoglobin 9.4 (*)    HCT 31.1 (*)    MCV 104.0 (*)    Platelets 76 (*)    Abs Immature Granulocytes 0.15 (*)    All other components within normal limits  CBG MONITORING, ED - Abnormal; Notable for the following components:   Glucose-Capillary 193 (*)    All other components within normal limits  RESPIRATORY PANEL BY RT PCR (FLU A&B, COVID)  COMPREHENSIVE METABOLIC PANEL  URINALYSIS, ROUTINE W REFLEX MICROSCOPIC  CK    EKG None  Radiology DG Pelvis Portable  Result  Date: 10/02/2019 CLINICAL DATA:  Fall with left hip pain. EXAM: PORTABLE PELVIS 1-2 VIEWS COMPARISON:  None. FINDINGS: Left hip in internal rotation. No fracture visible given this projection. Suggest additional imaging if concern persists. IMPRESSION: Internally rotated left hip. No fracture visible. Additional images suggested if concern persists. Electronically Signed   By: Nelson Chimes M.D.   On: 10/02/2019 15:08   DG Chest Portable 1 View  Result Date: 10/02/2019 CLINICAL DATA:  [place].  Altered mental status. EXAM: PORTABLE CHEST 1 VIEW COMPARISON:  06/28/2018 FINDINGS: Cardiomegaly. Aortic atherosclerosis. Right lung is clear. Minimal atelectasis or infiltrate at the left base. No evidence of heart failure or effusion. No regional traumatic bone finding. IMPRESSION: Mild left base atelectasis or infiltrate.  Otherwise negative. Electronically Signed   By: Nelson Chimes M.D.   On: 10/02/2019 15:09    Procedures Procedures (including critical care time)  Medications Ordered in ED Medications - No data to display  ED Course  I have reviewed the triage vital signs and the nursing notes.  Pertinent labs & imaging results that were available during my care of the patient were reviewed by me and considered in my medical decision making (see chart for details).    MDM Rules/Calculators/A&P                      Patient wife mental status change.  Found down.  Likely hip fracture on left.  However eyes seem to be deviated to the left, or not cross to the right.  Will move all extremities pain however.  Head CT done.  On Coumadin.  Neurology consulted.  Care turned over to Dr. Tyrone Nine. Final Clinical Impression(s) / ED Diagnoses Final diagnoses:  Altered mental status, unspecified altered mental status type    Rx / DC Orders ED Discharge Orders    None       Davonna Belling, MD 10/02/19 1549

## 2019-10-02 NOTE — Progress Notes (Signed)
EEG complete - results pending 

## 2019-10-02 NOTE — ED Provider Notes (Signed)
I received the patient in signout from Dr. Alvino Chapel.  Briefly the patient is a 73 year old male who was found today altered from his baseline.  Brought into the hospital.  Likely has a left hip fracture clinically.  Plain film without obvious fracture.  Will obtain a dedicated left hip view.  Patient's eyes are now tracking completely to the right, neurology was consulted and recommended a stat EEG.  Awaiting CT scan of the head.  Likely admission post imaging.  CT of the head without acute finding.  Patient's come back at 162.  Likely the cause of his altered mental status.  We will send off and osmolality.  Patient still having pain of the left leg which is not identified with imaging of the hip.  Plain film of the upper and lower leg were performed.  There is possible hardware loosening of the prior patella fracture.  He does have some tenderness there though his pain seems diffuse.  Due to his mental status changes difficult to obtain a great history for this.  I discussed the case with Dr. Doreatha Martin, orthopedics.  Recommended leaving as is.  They will try and evaluate the patient at bedside tomorrow.   Deno Etienne, DO 10/02/19 2122

## 2019-10-02 NOTE — H&P (Addendum)
History and Physical    Mark Guarneri. JJH:[id number] DOB: [birth year] DOA: 10/02/2019  PCP: Sandi Mariscal, MD Patient coming from: Home  Chief Complaint: Altered mental status  HPI: Mark Partch. is a 74 y.o. 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  Patient was found laying on the couch by his neighbor who called 911.  He was covered in urine and feces.  Last known normal a couple of days ago. EMS reported full body tremors in route and patient was given Versed.  No history could be obtained from the patient due to his altered mental status.  ED Course: Afebrile.  Slightly tachycardic and tachypneic.  Not hypotensive.  Not hypoxic.  Labs showing no leukocytosis.  Hemoglobin 9.4, no significant change compared to labs done a month ago.  Platelet count 76,000, mildly low on previous labs as well but now worse.  Corrected sodium 164.  Serum osmolality pending.  Chloride 129.  Bicarb 19, anion gap 14.  Blood glucose 218.  BUN 136, creatinine 4.6.  Baseline creatinine 1.6-2.0.  T bili 1.5, remainder of LFTs normal.  INR 1.6.  CK 470.  UA pending.  SARS-CoV-2 PCR test negative.  Influenza panel negative. Chest x-ray showing minimal atelectasis or infiltrate at the left lung base.  No evidence of heart failure or effusion. Head CT negative for acute intracranial abnormality. CT C-spine negative for acute osseous abnormality. Pelvic x-ray without evidence of fracture. X-ray of left hip/pelvis negative for fracture X-ray of right clavicle negative for fracture or dislocation. X-ray of left femur showing small left knee joint effusion and no acute osseous abnormality. X-ray of left tibia/fibula negative for fracture.  Showing postop changes about the patella with signs of  hardware failure of unknown chronicity associated with soft tissue swelling.  Marked tricompartmental osteoarthritic change. Neurology consulted.  No medications or fluid given.  Review of Systems:  All systems reviewed and apart from history of presenting illness, are negative.  Past Medical History:  Diagnosis Date  . Chronic kidney disease (CKD)   . DVT (deep venous thrombosis) ([place])   . Multiple myeloma ([place])   . Solitary kidney     Past Surgical History:  Procedure Laterality Date  . RIGHT/LEFT HEART CATH AND CORONARY ANGIOGRAPHY N/A 09/11/2017   Procedure: RIGHT/LEFT HEART CATH AND CORONARY ANGIOGRAPHY;  Surgeon: Nigel Mormon, MD;  Location: [facility];  Service: Cardiovascular;  Laterality: N/A;     reports that he quit smoking about 30 years ago. His smoking use included cigarettes. He has a 30.00 pack-year smoking history. He has never used smokeless tobacco. He reports that he does not drink alcohol. No history on file for drug.  Allergies  Allergen Reactions  . Penicillins Swelling    Has patient had a PCN reaction causing immediate rash, facial/tongue/throat swelling, SOB or lightheadedness with hypotension: Yes Has patient had a PCN reaction causing severe rash involving mucus membranes or skin necrosis: No Has patient had a PCN reaction that required hospitalization:No Has patient had a PCN reaction occurring within the last 10 years: No If all of the above answers are "NO", then may proceed with Cephalosporin use.   . Molds & Smuts Other (See Comments)    Due to allergy testing.    Family History  Problem Relation Age of Onset  .  Emphysema Father     Prior to Admission medications   Medication Sig Start Date End Date Taking? Authorizing Provider  acetaminophen-codeine (TYLENOL #3) 300-30 MG tablet Take 1 tablet by mouth every 4 (four) hours as needed for moderate pain. 09/24/18   Lucianne Lei, MD  acyclovir (ZOVIRAX) 200 MG capsule Take 1 capsule  (200 mg total) by mouth 2 (two) times daily. 09/03/19   Brunetta Genera, MD  allopurinol (ZYLOPRIM) 300 MG tablet Take 300 mg by mouth daily.  01/29/19   [provider]  BIDIL 20-37.5 MG tablet TAKE 1/2 TABLET BY MOUTH TWICE DAILY Patient taking differently: 1 tablet.  07/29/19   Patwardhan, Reynold Bowen, MD  dexamethasone (DECADRON) 4 MG tablet Take 5 tablets (20 mg total) by mouth once a week. 09/03/19   Brunetta Genera, MD  ixazomib citrate (NINLARO) 3 MG capsule Take 1 capsule (3 mg) by mouth weekly, 3 weeks on, 1 week off, repeat every 4 weeks. Take on an empty stomach 1hr before or 2hr after meals. 09/03/19   Brunetta Genera, MD  metoprolol succinate (TOPROL-XL) 25 MG 24 hr tablet Take 1 tablet (25 mg total) by mouth daily. Take with or immediately following a meal. 04/22/19 09/02/19  Patwardhan, Manish J, MD  ondansetron (ZOFRAN) 8 MG tablet Take 1 tablet (8 mg total) by mouth every 8 (eight) hours as needed for nausea. 09/03/19   Brunetta Genera, MD  pravastatin (PRAVACHOL) 20 MG tablet Take 20 mg by mouth daily.    [provider]  torsemide (DEMADEX) 20 MG tablet Take 1 tablet (20 mg total) by mouth daily. 04/22/19   Patwardhan, Reynold Bowen, MD  warfarin (COUMADIN) 10 MG tablet TAKE 1 TABLET(10 MG) BY MOUTH DAILY 08/05/19   Nigel Mormon, MD    Physical Exam: Vitals:   10/02/19 2000 10/02/19 2050 10/02/19 2100 10/02/19 2115  BP: (!) 134/99 (!) 148/74 (!) 140/57 (!) 146/89  Pulse: (!) 107 (!) 51 (!) 54 (!) 49  Resp: (!) 26 (!) 29 (!) 29 (!) 30  Temp:      TempSrc:      SpO2: 100% 100% 100% 100%    Physical Exam  Constitutional: No distress.  HENT:  Head: Normocephalic.  Very dry mucous membranes  Eyes: Right eye exhibits no discharge. Left eye exhibits no discharge.  Cardiovascular: Normal rate, regular rhythm and intact distal pulses.  Pulmonary/Chest: Effort normal and breath sounds normal. No respiratory distress. He has no wheezes. He  has no rales.  Abdominal: Soft. Bowel sounds are normal. He exhibits no distension. There is no abdominal tenderness. There is no guarding.  Musculoskeletal:        General: Edema present.     Cervical back: Neck supple.     Comments: Left lower extremity erythematous and significantly larger in size compared to the contralateral side  Neurological:  Awake Not alert Nonverbal and not following commands Bilateral hand tremors noted Moving upper extremities spontaneously  Skin: Skin is warm and dry. He is not diaphoretic.     Labs on Admission: I have personally reviewed following labs and imaging studies  CBC: Recent Labs  Lab 10/02/19 1440  WBC 8.8  NEUTROABS 7.3  HGB 9.4*  HCT 31.1*  MCV 104.0*  PLT 76*   Basic Metabolic Panel: Recent Labs  Lab 10/02/19 1440 10/02/19 2101  NA 162* 164*  K 4.4 4.6  CL 129* 130*  CO2 19* 19*  GLUCOSE 218* 208*  BUN 136* 140*  CREATININE 4.67* 4.54*  CALCIUM 9.4 9.6   GFR: CrCl cannot be calculated (Unknown ideal weight.). Liver Function Tests: Recent Labs  Lab 10/02/19 1440  AST 30  ALT 43  ALKPHOS 96  BILITOT 1.5*  PROT 10.5*  ALBUMIN 2.6*   No results for input(s): LIPASE, AMYLASE in the last 168 hours. No results for input(s): AMMONIA in the last 168 hours. Coagulation Profile: Recent Labs  Lab 10/02/19 1440  INR 1.6*   Cardiac Enzymes: Recent Labs  Lab 10/02/19 1440  CKTOTAL 470*   BNP (last 3 results) No results for input(s): PROBNP in the last 8760 hours. HbA1C: No results for input(s): HGBA1C in the last 72 hours. CBG: Recent Labs  Lab 10/02/19 1455 10/02/19 2047  GLUCAP 193* 185*   Lipid Profile: No results for input(s): CHOL, HDL, LDLCALC, TRIG, CHOLHDL, LDLDIRECT in the last 72 hours. Thyroid Function Tests: No results for input(s): TSH, T4TOTAL, FREET4, T3FREE, THYROIDAB in the last 72 hours. Anemia Panel: No results for input(s): VITAMINB12, FOLATE, FERRITIN, TIBC, IRON, RETICCTPCT in  the last 72 hours. Urine analysis:    Component Value Date/Time   COLORURINE AMBER (A) 10/02/2019 1955   APPEARANCEUR HAZY (A) 10/02/2019 1955   LABSPEC 1.016 10/02/2019 1955   PHURINE 5.0 10/02/2019 1955   GLUCOSEU NEGATIVE 10/02/2019 1955   HGBUR MODERATE (A) 10/02/2019 [place] NEGATIVE 10/02/2019 [place] NEGATIVE 10/02/2019 1955   PROTEINUR 30 (A) 10/02/2019 1955   NITRITE NEGATIVE 10/02/2019 1955   LEUKOCYTESUR LARGE (A) 10/02/2019 1955    Radiological Exams on Admission: EEG  Result Date: 10/02/2019 Lora Havens, MD     10/02/2019  4:56 PM Patient Name: Mark Guarneri. MRN: [id number] Epilepsy Attending: Lora Havens Referring Physician/Provider: Etta Quill, PA Date: 10/02/2019 Duration: 23.11 mins Patient history: 74yo M with right frontal infarct, found at house altered and covered in urine , feces. Per EMS he had full body tremors concerning for seizure. EEG to evaluate for seizure Level of alertness: lethargic AEDs during EEG study: None Technical aspects: This EEG study was done with scalp electrodes positioned according to the 10-20 International system of electrode placement. Electrical activity was acquired at a sampling rate of 500Hz and reviewed with a high frequency filter of 70Hz and a low frequency filter of 1Hz. EEG data were recorded continuously and digitally stored. DESCRIPTION: EEG showed continuous generalized and lateralized right hemisphere 3-5Hz theta-delta slowing. EEG was reactive to tactile stimulation. Hyperventilation and photic stimulation were not performed. ABNORMALITY - Continuous slow, generalized and lateralized right hemisphere IMPRESSION: This study is suggestive of cortical dysfunction in right hemisphere likely secondary to underlying stroke as well as moderate diffuse encephalopathy, non specific to etiology. No seizures or epileptiform discharges were seen throughout the recording. Lora Havens   DG Clavicle  Right  Result Date: 10/02/2019 CLINICAL DATA:  Fall. Pt was nonverbal for imaging. Pt did display sounds of pain when rolling for images of his left hip. Pt did not display any signs of pain or acknowledgement when imaging his right clavicle. EXAM: RIGHT CLAVICLE - 2+ VIEWS COMPARISON:  None. FINDINGS: No fracture or bone lesion. AC joint normally spaced and aligned. Small marginal spurs at the [facility] joint. Glenohumeral joint is normally aligned. Soft tissues are unremarkable. IMPRESSION: 1. No fracture or dislocation. 2. Mild AC joint osteoarthritis. Electronically Signed   By: Lajean Manes M.D.   On: 10/02/2019 16:08   DG Tibia/Fibula Left  Result Date: 10/02/2019 CLINICAL  DATA:  Left leg pain. EXAM: LEFT TIBIA AND FIBULA - 2 VIEW COMPARISON:  Left hip evaluation from January 27th 2021 FINDINGS: Osteopenia. Signs of previous surgery about the left knee with a screw spanning the patella. There are wires which are fractured seen throughout the soft tissues about the knee. Soft tissue swelling is noted in the suprapatellar region partially imaged. Marked tricompartmental osteoarthritic changes are present. No signs of acute fracture or discrete bone abnormality aside from findings above. IMPRESSION: 1. No signs of acute fracture. 2. Postoperative changes about the patella with signs of hardware failure of unknown chronicity but associated with soft tissue swelling correlate with acute pain in this area. Comparison with prior imaging could be helpful. 3. Dedicated assessment of the knee may be warranted if there is focal pain in this area. 4. Marked tricompartmental osteoarthritic change. Electronically Signed   By: Zetta Bills M.D.   On: 10/02/2019 18:10   CT Head Wo Contrast  Result Date: 10/02/2019 CLINICAL DATA:  Altered mental status. EXAM: CT HEAD WITHOUT CONTRAST TECHNIQUE: Contiguous axial images were obtained from the base of the skull through the vertex without intravenous contrast. COMPARISON:   September 11, 2019 FINDINGS: Brain: There is moderate severity cerebral atrophy with widening of the extra-axial spaces and ventricular dilatation. There are areas of decreased attenuation within the white matter tracts of the supratentorial brain, consistent with microvascular disease changes. A small to moderate sized area of cortical encephalomalacia, with adjacent chronic white matter low attenuation, is again seen within the right frontal lobe. Vascular: No hyperdense vessel or unexpected calcification. Skull: Normal. Negative for fracture or focal lesion. Sinuses/Orbits: No acute finding. Other: None. IMPRESSION: 1. Generalized cerebral atrophy. 2. Chronic right frontal lobe infarct. 3. No acute intracranial abnormality. Electronically Signed   By: Virgina Norfolk M.D.   On: 10/02/2019 16:15   CT Cervical Spine Wo Contrast  Result Date: 10/02/2019 CLINICAL DATA:  Altered mental status with possible fall. EXAM: CT CERVICAL SPINE WITHOUT CONTRAST TECHNIQUE: Multidetector CT imaging of the cervical spine was performed without intravenous contrast. Multiplanar CT image reconstructions were also generated. COMPARISON:  None. FINDINGS: Alignment: Normal. Skull base and vertebrae: No acute fracture. No primary bone lesion or focal pathologic process. Soft tissues and spinal canal: No prevertebral fluid or swelling. No visible canal hematoma. Disc levels: C2-3: There is mild end plate spondylosis. Mild disc space narrowing is seen. Bilateral facet hypertrophy is noted. Normal central canal and intervertebral neuroforamina. C3-4: There is moderate to marked severity end plate spondylosis. Marked severity disc space narrowing is seen. Bilateral facet hypertrophy is noted. Normal central canal and intervertebral neuroforamina. C4-5: There is moderate to marked severity end plate spondylosis. Marked severity disc space narrowing is seen. Bilateral facet hypertrophy is noted. Normal central canal and intervertebral  neuroforamina. C5-6: There is moderate to marked severity end plate spondylosis. Marked severity disc space narrowing is seen. Bilateral facet hypertrophy is noted. Normal central canal and intervertebral neuroforamina. C6-7: There is mild to moderate severity end plate spondylosis. Mild to moderate severity disc space narrowing is seen. Bilateral facet hypertrophy is noted. Normal central canal and intervertebral neuroforamina. C7-T1: There is mild end plate spondylosis. Mild disc space narrowing is seen. Bilateral facet hypertrophy is noted. Normal central canal and intervertebral neuroforamina. Upper chest: Negative. Other: None. IMPRESSION: 1. Marked severity multilevel degenerative changes without evidence of acute osseous abnormality. Electronically Signed   By: Virgina Norfolk M.D.   On: 10/02/2019 16:20   DG Pelvis Portable  Result Date: 10/02/2019 CLINICAL DATA:  Fall with left hip pain. EXAM: PORTABLE PELVIS 1-2 VIEWS COMPARISON:  None. FINDINGS: Left hip in internal rotation. No fracture visible given this projection. Suggest additional imaging if concern persists. IMPRESSION: Internally rotated left hip. No fracture visible. Additional images suggested if concern persists. Electronically Signed   By: Nelson Chimes M.D.   On: 10/02/2019 15:08   DG Chest Portable 1 View  Result Date: 10/02/2019 CLINICAL DATA:  [place].  Altered mental status. EXAM: PORTABLE CHEST 1 VIEW COMPARISON:  06/28/2018 FINDINGS: Cardiomegaly. Aortic atherosclerosis. Right lung is clear. Minimal atelectasis or infiltrate at the left base. No evidence of heart failure or effusion. No regional traumatic bone finding. IMPRESSION: Mild left base atelectasis or infiltrate.  Otherwise negative. Electronically Signed   By: Nelson Chimes M.D.   On: 10/02/2019 15:09   DG Hip Unilat W or Wo Pelvis 2-3 Views Left  Result Date: 10/02/2019 CLINICAL DATA:  Fall. Pt was nonverbal for imaging. Pt did display sounds of pain when rolling for  images of his left hip. Pt did not display any signs of pain or acknowledgement when imaging his right clavicle. EXAM: DG HIP (WITH OR WITHOUT PELVIS) 2-3V LEFT COMPARISON:  None. FINDINGS: No fracture or bone lesion. Left hip joint normally spaced and aligned. No arthropathic changes. Soft tissues are unremarkable. IMPRESSION: Negative. Electronically Signed   By: Lajean Manes M.D.   On: 10/02/2019 16:07   DG Femur Min 2 Views Left  Result Date: 10/02/2019 CLINICAL DATA:  Left leg pain. EXAM: LEFT FEMUR 2 VIEWS COMPARISON:  None. FINDINGS: There is no evidence of fracture or other focal bone lesions. A radiopaque surgical screw and thin curvilinear surgical sutures are seen within the left patella. Surgical sutures are also seen within the soft tissues superior to the left patella. There is a small joint effusion involving the left knee. IMPRESSION: 1. Postoperative changes involving the left knee, as described above. 2. No acute osseous abnormality. 3. Small left knee joint effusion. Electronically Signed   By: Virgina Norfolk M.D.   On: 10/02/2019 18:07    EKG: Pending at this time.  Assessment/Plan Principal Problem:   Hypernatremia Active Problems:   Chronic renal insufficiency   AKI (acute kidney injury) ([place])   Toxic metabolic encephalopathy   Thrombocytopenia ([facility])   Severe hypernatremia Suspect related to severe dehydration.  Corrected sodium 164.  Patient was found down laying on his couch by his neighbor covered in urine and feces.  Unclear how long he has been in this condition.  Appears very dehydrated on exam. -D5W _0  cc/hr -Monitor sodium level every 4 hours and adjust rate of fluids accordingly.  Goal rate of correction 10-12 meq in 24 hours. -Check serum osmolarity and urine osmolarity  AKI on CKD stage 3 Suspect prerenal due to severe dehydration. BUN 136, creatinine 4.6.  Baseline creatinine 1.6-2.0. -IV fluid hydration -Monitor renal function and urine  output -Avoid nephrotoxic agents/contrast  Toxic metabolic encephalopathy Patient was seen by neurology and his presentation is thought to be due to toxic metabolic encephalopathy likely secondary to hyponatremia and AKI on CKD.  EEG was done in the ED and did not show evidence of seizure or status epilepticus.  Head CT negative for acute intracranial abnormality.  His presentation is not thought to be due to a CNS infection and neurology not recommending LP at this time. -Management of hypernatremia and AKI as mentioned above -Continue to monitor -Frequent neurochecks -Check ammonia, TSH, B12  levels  Left lower extremity pain Patient currently appears comfortable, however, upon assessment done by ED provider, it was felt that he was experiencing pain in his left hip/lower extremity.  Pelvic x-ray without evidence of fracture. X-ray of left hip/pelvis negative for fracture. X-ray of left femur showing small left knee joint effusion and no acute osseous abnormality. X-ray of left tibia/fibula negative for fracture.  Showing postop changes about the patella with signs of hardware failure of unknown chronicity associated with soft tissue swelling.  Marked tricompartmental osteoarthritic change. -ED provider discussed the case with Dr. Doreatha Martin from orthopedics, will consult in a.m.  Chronic combined systolic and diastolic congestive heart failure Echo done May 2020 with LVEF 20% and grade 1 diastolic dysfunction.  Suspect left lower extremity edema is related to history of DVT.  Otherwise, does not appear clinically volume overloaded on exam.  Chest x-ray without evidence of volume overload.  Appears very dehydrated. -Hold diuretic at this time  History of LLE DVT in May 2020 Patient is on chronic anticoagulation with Coumadin but INR currently subtherapeutic at 1.6. -Coumadin dosing per pharmacy -Monitor PT/INR -Left lower extremity Doppler to reassess clot burden as this may also be contributing  to left lower extremity pain  Thrombocytopenia Platelet count 76,000, mildly low on previous labs as well but now worse.  No signs of active bleeding. -Continue to monitor  Normal anion gap metabolic acidosis Bicarb 19, anion gap 14.  Suspect related to AKI.   -IV fluid hydration -Continue to monitor  Hyperglycemia Blood glucose 218.  No documented history of diabetes. -Check A1c.  Sliding scale insulin very sensitive every 4 hours as patient is on D5W for hypernatremia.  ?PNA Chest x-ray showing minimal atelectasis or infiltrate at the left lung base.  Pneumonia less likely given no fever or leukocytosis. -Check procalcitonin level Addendum: Procalcitonin level significantly elevated at 0.71.  Start Levaquin.  Pharmacy med rec pending.  DVT prophylaxis: Coumadin Code Status: Full code Family Communication: No family available at this time. Disposition Plan: Anticipate discharge after clinical improvement. Consults called: Neurology, orthopedics Admission status: It is my clinical opinion that admission to INPATIENT is reasonable and necessary in this 74 y.o. male . presenting with symptoms of altered mental status/toxic metabolic encephalopathy secondary to severe hypernatremia and AKI on CKD stage III.  Patient will need IV fluid hydration for gradual correction of serum sodium level.  High risk of decompensation.  Given the aforementioned, the predictability of an adverse outcome is felt to be significant. I expect that the patient will require at least 2 midnights in the hospital to treat this condition.   The medical decision making on this patient was of high complexity and the patient is at high risk for clinical deterioration, therefore this is a level 3 visit.  Shela Leff MD [facility]  If 7PM-7AM, please contact night-coverage [email or web address] Password [facility]  10/02/2019, 9:35 PM

## 2019-10-02 NOTE — Procedures (Signed)
Patient Name: Mark Baird.  MRN: [id number]  Epilepsy Attending: Lora Havens  Referring Physician/Provider: Etta Quill, PA Date: 10/02/2019 Duration: 23.11 mins  Patient history: 74yo M with right frontal infarct, found at house altered and covered in urine , feces. Per EMS he had full body tremors concerning for seizure. EEG to evaluate for seizure  Level of alertness: lethargic  AEDs during EEG study: None  Technical aspects: This EEG study was done with scalp electrodes positioned according to the 10-20 International system of electrode placement. Electrical activity was acquired at a sampling rate of 500Hz  and reviewed with a high frequency filter of 70Hz  and a low frequency filter of 1Hz . EEG data were recorded continuously and digitally stored.   DESCRIPTION: EEG showed continuous generalized and lateralized right hemisphere 3-5Hz  theta-delta slowing. EEG was reactive to tactile stimulation. Hyperventilation and photic stimulation were not performed.  ABNORMALITY - Continuous slow, generalized and lateralized right hemisphere  IMPRESSION: This study is suggestive of cortical dysfunction in right hemisphere likely secondary to underlying stroke as well as moderate diffuse encephalopathy, non specific to etiology. No seizures or epileptiform discharges were seen throughout the recording.  Nara Paternoster Barbra Sarks

## 2019-10-02 NOTE — Consult Note (Addendum)
Neurology Consultation  Reason for Consult: Altered mental status and possible seizure Referring Physician: Davonna Belling  CC: Altered mental status/questionable seizure  History is obtained from: Notes  HPI: Mark Baird. is a 74 y.o. 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  He was brought in by EMS after being found down lying on couch by a neighbor who called 911.  His last known normal was couple days ago.  Patient was found in urine and feces.  EMS found him with full body tremors in route not present at this time.  There was question of patient possibly was having seizure thus neurology was consulted. Also possibly has a left hip fracture due to the leg being extremely painful and externally rotated.  On evaluation patient is unable to follow commands.  No tremors noted at this time other than tremulous movements around the face. patient does respond to pain in all 4 extremities.   Of note patient is on chronic Coumadin for DVT with INR of 1.6.  Attempted to get in contact with son but was unable to reach  ED course  Relevant labs include -INR 1.6, NA 162, BUN 136, Cr 4.67, CK 470 CT head shows-pending  Past Medical History:  Diagnosis Date  . Chronic kidney disease (CKD)   . DVT (deep venous thrombosis) ([place])   . Multiple myeloma ([place])   . Solitary kidney      Family History  Problem Relation Age of Onset  . Emphysema Father    Social History:   reports that he quit smoking about 30 years ago. His smoking use included cigarettes. He has a 30.00 pack-year smoking history. He has never used smokeless tobacco. He reports that he does not drink alcohol. No history on file for drug.  Medications No current facility-administered medications for this  encounter.  Current Outpatient Medications:  .  acetaminophen-codeine (TYLENOL #3) 300-30 MG tablet, Take 1 tablet by mouth every 4 (four) hours as needed for moderate pain., Disp: , Rfl:  .  acyclovir (ZOVIRAX) 200 MG capsule, Take 1 capsule (200 mg total) by mouth 2 (two) times daily., Disp: 60 capsule, Rfl: 1 .  allopurinol (ZYLOPRIM) 300 MG tablet, Take 300 mg by mouth daily. , Disp: , Rfl:  .  BIDIL 20-37.5 MG tablet, TAKE 1/2 TABLET BY MOUTH TWICE DAILY (Patient taking differently: 1 tablet. ), Disp: 90 tablet, Rfl: 3 .  dexamethasone (DECADRON) 4 MG tablet, Take 5 tablets (20 mg total) by mouth once a week., Disp: 30 tablet, Rfl: 1 .  ixazomib citrate (NINLARO) 3 MG capsule, Take 1 capsule (3 mg) by mouth weekly, 3 weeks on, 1 week off, repeat every 4 weeks. Take on an empty stomach 1hr before or 2hr after meals., Disp: 3 capsule, Rfl: 2 .  metoprolol succinate (TOPROL-XL) 25 MG 24 hr tablet, Take 1 tablet (25 mg total) by mouth daily. Take with or immediately following a meal., Disp: 30 tablet, Rfl: 3 .  ondansetron (ZOFRAN) 8 MG tablet, Take 1 tablet (8 mg total) by mouth every 8 (eight) hours as needed for nausea., Disp: 30 tablet, Rfl: 3 .  pravastatin (PRAVACHOL) 20 MG tablet, Take 20 mg by mouth daily., Disp: , Rfl:  .  torsemide (DEMADEX) 20 MG tablet, Take 1 tablet (20 mg total) by mouth daily., Disp: 60  tablet, Rfl: 2 .  warfarin (COUMADIN) 10 MG tablet, TAKE 1 TABLET(10 MG) BY MOUTH DAILY, Disp: 90 tablet, Rfl: 0  ROS:   Unable to obtain due to altered mental status.    Exam: Current vital signs: BP (!) 151/58   Pulse (!) 54   Temp 98.7 F (37.1 C) (Axillary)   Resp (!) 23   SpO2 96%  Vital signs in last 24 hours: Temp:  [98.7 F (37.1 C)] 98.7 F (37.1 C) (01/27 1439) Pulse Rate:  [54] 54 (01/27 1436) Resp:  [23-27] 23 (01/27 1615) BP: (138-151)/(58-99) 151/58 (01/27 1530) SpO2:  [96 %] 96 % (01/27 1436) Constitutional: Appears well-developed and well-nourished.   Eyes: No scleral injection HENT: No OP obstrucion Head: Normocephalic.  Cardiovascular: Tachycardic and regular rhythm.  Respiratory: Effort normal, non-labored breathing GI: Soft.  No distension. There is no tenderness.  Skin: WDI  Neuro: Mental Status: Patient does not respond to verbal stimuli.  Moans to deep sternal rub.  Does not follow commands.  No verbalizations noted.  Cranial Nerves: II: patient does not respond confrontation bilaterally,  III,IV,VI: Disconjugate gaze at rest with doll's response showing that eyes do not cross midline to the right. pupils right 2 mm, left 2 mm,and reactive bilaterally V,VII: corneal reflex present bilaterally  VIII: patient does not respond to verbal stimuli Motor: Withdraws from noxious stimuli throughout Sensory: Moans to noxious stimuli in all 4 extremities Deep Tendon Reflexes:  No ankle jerk, bilateral knee jerk present Plantars: Downgoing bilaterally  Labs I have reviewed labs in epic and the results pertinent to this consultation are:   CBC    Component Value Date/Time   WBC 8.8 10/02/2019 1440   RBC 2.99 (L) 10/02/2019 1440   HGB 9.4 (L) 10/02/2019 1440   HGB 11.7 (L) 10/11/2010 0846   HCT 31.1 (L) 10/02/2019 1440   HCT 33.6 (L) 10/11/2010 0846   PLT 76 (L) 10/02/2019 1440   PLT 144 10/11/2010 0846   MCV 104.0 (H) 10/02/2019 1440   MCV 91.6 10/11/2010 0846   MCH 31.4 10/02/2019 1440   MCHC 30.2 10/02/2019 1440   RDW 14.3 10/02/2019 1440   RDW 12.7 10/11/2010 0846   LYMPHSABS 0.8 10/02/2019 1440   LYMPHSABS 0.7 (L) 10/11/2010 0846   MONOABS 0.5 10/02/2019 1440   MONOABS 0.3 10/11/2010 0846   EOSABS 0.0 10/02/2019 1440   EOSABS 0.1 10/11/2010 0846   BASOSABS 0.0 10/02/2019 1440   BASOSABS 0.0 10/11/2010 0846    CMP     Component Value Date/Time   NA 162 (HH) 10/02/2019 1440   NA 140 01/04/2019 1157   K 4.4 10/02/2019 1440   CL 129 (H) 10/02/2019 1440   CO2 19 (L) 10/02/2019 1440   GLUCOSE 218 (H)  10/02/2019 1440   BUN 136 (H) 10/02/2019 1440   BUN 23 01/04/2019 1157   CREATININE 4.67 (H) 10/02/2019 1440   CREATININE 2.06 (H) 08/06/2019 1250   CALCIUM 9.4 10/02/2019 1440   PROT 10.5 (H) 10/02/2019 1440   ALBUMIN 2.6 (L) 10/02/2019 1440   AST 30 10/02/2019 1440   AST 12 (L) 08/06/2019 1250   ALT 43 10/02/2019 1440   ALT 9 08/06/2019 1250   ALKPHOS 96 10/02/2019 1440   BILITOT 1.5 (H) 10/02/2019 1440   BILITOT 0.3 08/06/2019 1250   GFRNONAA 12 (L) 10/02/2019 1440   GFRNONAA 31 (L) 08/06/2019 1250   GFRAA 13 (L) 10/02/2019 1440   GFRAA 36 (L) 08/06/2019 1250    Imaging  CT head- no acute infarct  EEG- shows no epileptiform activity  Etta Quill PA-C [facility] [phone]  M-F  (9:00 am- 5:00 PM)  10/02/2019, 4:47 PM   Attending addendum Patient seen in the emergency room for altered mental status, concern for seizure after being found down. Agree with history physical documented below I have personally reviewed imaging-CT head with no acute process.  Assessment:  74 year old male with history of multiple myeloma, encephalomalacia from prior anterior right MCA territory infarct, DVT on chronic Coumadin presenting to the hospital secondary to altered mental status.  At this point time patient is only responsive to pain.  Eyes do not cross midline to the right with  doll's response and disconjugate at rest.  CMP shows patients sodium 162, BUN 136, and Cr 4.67-markedly abnormal and definitely elevated from baseline. Current presentation likely secondary to the multiple electrolyte/renal function derangement Due to severe hypernatremia, concern for underlying seizures for which a stat EEG was obtained.  No evidence of ongoing seizure activity. Tremulous movements likely asterixis from uremia.  Impression: -Toxic metabolic encepahlopathy likely secondary to hypernatremia and AKI on CKD. -EEG unremarkable for any lateralizing abnormalities.  Brain imaging  also unremarkable for acute process.  Less likely to be a stroke, and also is not status epilepticus.  Seizure cannot be ruled out because of the derangements in the sodium levels but currently not in status.  Recommendations: -Stat EEG recommended-reviewed after completion -no evidence of seizure or status epilepticus.  Generalized slowing and right-sided slowing-likely secondary to known right MCA stroke.  Suggestive of generalized encephalopathy. -Stat head CT recommended-reviewed after completion -no acute process. -No need for antiepileptics -Aggressive treatment of the hypernatremia and AKI on CKD. -No evidence to suggest CNS infection-no indication for LP.  Plan relayed to Dr. Deno Etienne, [place]  Neurology service will be available as needed.  Please call with questions.  -- Amie Portland, MD [facility] Pager: [phone] If 7pm to 7am, please call on call as listed on AMION.  CRITICAL CARE ATTESTATION Performed by: Amie Portland, MD Total critical care time: 45 minutes Critical care time was exclusive of separately billable procedures and treating other patients and/or supervising APPs/Residents/Students Critical care was necessary to treat or prevent imminent or life-threatening deterioration due to multifactorial toxic metabolic encephalopathy, severe hypernatremia, AKI on CKD. This patient is critically ill and at significant risk for neurological worsening and/or death and care requires constant monitoring. Critical care was time spent personally by me on the following activities: development of treatment plan with patient and/or surrogate as well as nursing, discussions with consultants, evaluation of patient's response to treatment, examination of patient, obtaining history from patient or surrogate, ordering and performing treatments and interventions, ordering and review of laboratory studies, ordering and review of radiographic studies, pulse oximetry, re-evaluation  of patient's condition, participation in multidisciplinary rounds and medical decision making of high complexity in the care of this patient.

## 2019-10-02 NOTE — Progress Notes (Addendum)
ANTICOAGULATION CONSULT NOTE - Initial Consult  Pharmacy Consult for Warfarin Indication: VTE treatment   Allergies  Allergen Reactions  . Penicillins Swelling    Has patient had a PCN reaction causing immediate rash, facial/tongue/throat swelling, SOB or lightheadedness with hypotension: Yes Has patient had a PCN reaction causing severe rash involving mucus membranes or skin necrosis: No Has patient had a PCN reaction that required hospitalization:No Has patient had a PCN reaction occurring within the last 10 years: No If all of the above answers are "NO", then may proceed with Cephalosporin use.   . M[employer] Other (See Comments)    Due to allergy testing.    Patient Measurements:   Heparin Dosing Weight:   Vital Signs: Temp: 98.7 F (37.1 C) (01/27 1439) Temp Source: Axillary (01/27 1439) BP: 134/99 (01/27 2000) Pulse Rate: 107 (01/27 2000)  Labs: Recent Labs    10/02/19 1440  HGB 9.4*  HCT 31.1*  PLT 76*  LABPROT 19.2*  INR 1.6*  CREATININE 4.67*  CKTOTAL 470*    CrCl cannot be calculated (Unknown ideal weight.).   Medical History: Past Medical History:  Diagnosis Date  . Chronic kidney disease (CKD)   . DVT (deep venous thrombosis) ([place])   . Multiple myeloma ([place])   . Solitary kidney     Medications:  Scheduled:  . insulin aspart  0-6 Units Subcutaneous Q4H  . warfarin  10 mg Oral ONCE-1800  . [START ON 10/03/2019] Warfarin - Pharmacist Dosing Inpatient   Does not apply q1800    Assessment: Patient is a 79 yom that was found on the floor with decreases responsiveness. The patient's last known normal time was a couple of days ago. It is difficult to determine last doses of warfarin and schedule. Pharmacy has been asked to continue this patients home warfarin.   PTA regimen appears to be Warfarin 10 mg Daily    Goal of Therapy:  INR 2-3 Monitor platelets by anticoagulation protocol: Yes   Plan:  - Patient is currently subtherapeutic at this  time  - Will give Warfarin 10 mg PO x 1 dose tonight - If INR is subtherapeutic and Hgb improves tomorrow may need to consider bridging - Monitor patient for s/s of bleeding and CBC closely as Hgb 9.4 and Platelets are 76  - Likely from Multiple myeloma however will monitor   Duanne Limerick PharmD.BCPS  10/02/2019,8:41 PM

## 2019-10-02 NOTE — ED Triage Notes (Signed)
BIB EMS from home. Unknown LKW. Pt found laying on couch by neighbor who called 911. Covered in urine/feces. Normally A/OX4, only oriented to self today. Pt had full body tremors en route was given 2.5 Versed. VSS.

## 2019-10-02 NOTE — ED Notes (Signed)
ED TO INPATIENT HANDOFF REPORT  ED Nurse Name and Phone #:  Stephen [phone]  S Name/Age/Gender Mark A Ensminger Jr. 73 y.o. male Room/Bed: 027C/027C  Code Status   Code Status: Full Code  Home/SNF/Other Home Disoriented x 4 Is this baseline? No   Triage Complete: Triage complete  Chief Complaint Hypernatremia [E87.0]  Triage Note BIB EMS from home. Unknown LKW. Pt found laying on couch by neighbor who called 911. Covered in urine/feces. Normally A/OX4, only oriented to self today. Pt had full body tremors en route was given 2.5 Versed. VSS.     Allergies Allergies  Allergen Reactions  . Penicillins Swelling    Has patient had a PCN reaction causing immediate rash, facial/tongue/throat swelling, SOB or lightheadedness with hypotension: Yes Has patient had a PCN reaction causing severe rash involving mucus membranes or skin necrosis: No Has patient had a PCN reaction that required hospitalization:No Has patient had a PCN reaction occurring within the last 10 years: No If all of the above answers are "NO", then may proceed with Cephalosporin use.   . Molds & Smuts Other (See Comments)    Due to allergy testing.    Level of Care/Admitting Diagnosis ED Disposition    ED Disposition Condition Comment   Admit  Hospital Area: [facility] [[id number]]  Level of Care: Progressive [102]  Admit to Progressive based on following criteria: MULTISYSTEM THREATS such as stable sepsis, metabolic/electrolyte imbalance with or without encephalopathy that is responding to early treatment.  Covid Evaluation: Confirmed COVID Negative  Date Laboratory Confirmed COVID Negative: 10/02/2019  Diagnosis: Hypernatremia [198349]  Admitting Physician: RATHORE, VASUNDHRA [[id number]]  Attending Physician: RATHORE, VASUNDHRA [[id number]]  Estimated length of stay: past midnight tomorrow  Certification:: I certify this patient will need inpatient services for at least 2 midnights        B Medical/Surgery History Past Medical History:  Diagnosis Date  . Chronic kidney disease (CKD)   . DVT (deep venous thrombosis) (HCC)   . Multiple myeloma (HCC)   . Solitary kidney    Past Surgical History:  Procedure Laterality Date  . RIGHT/LEFT HEART CATH AND CORONARY ANGIOGRAPHY N/A 09/11/2017   Procedure: RIGHT/LEFT HEART CATH AND CORONARY ANGIOGRAPHY;  Surgeon: Patwardhan, Manish J, MD;  Location: [facility] INVASIVE [facility];  Service: Cardiovascular;  Laterality: N/A;     A IV Location/Drains/Wounds Patient Lines/Drains/Airways Status   Active Line/Drains/Airways    Name:   Placement date:   Placement time:   Site:   Days:   Peripheral IV 06/29/18 Left Antecubital   06/29/18    0031    Antecubital   460          Intake/Output Last 24 hours No intake or output data in the 24 hours ending 10/02/19 2117  Labs/Imaging Results for orders placed or performed during the hospital encounter of 10/02/19 (from the past 48 hour(s))  Protime-INR     Status: Abnormal   Collection Time: 10/02/19  2:40 PM  Result Value Ref Range   Prothrombin Time 19.2 (H) 11.4 - 15.2 seconds   INR 1.6 (H) 0.8 - 1.2    Comment: (NOTE) INR goal varies based on device and disease states. Performed at [facility], [address] [place], [place] [address]   Comprehensive metabolic panel     Status: Abnormal   Collection Time: 10/02/19  2:40 PM  Result Value Ref Range   Sodium 162 (HH) 135 - 145 mmol/L    Comment: CRITICAL RESULT   CALLED TO, READ BACK BY AND VERIFIED WITH: BAIN.C RN 1606 10/02/19 LANKFORDSANCHEZ.A     Potassium 4.4 3.5 - 5.1 mmol/L   Chloride 129 (H) 98 - 111 mmol/L   CO2 19 (L) 22 - 32 mmol/L   Glucose, Bld 218 (H) 70 - 99 mg/dL   BUN 136 (H) 8 - 23 mg/dL   Creatinine, Ser 4.67 (H) 0.61 - 1.24 mg/dL   Calcium 9.4 8.9 - 10.3 mg/dL   Total Protein 10.5 (H) 6.5 - 8.1 g/dL   Albumin 2.6 (L) 3.5 - 5.0 g/dL   AST 30 15 - 41 U/L   ALT 43 0 - 44 U/L   Alkaline Phosphatase 96  38 - 126 U/L   Total Bilirubin 1.5 (H) 0.3 - 1.2 mg/dL   GFR calc non Af Amer 12 (L) >60 mL/min   GFR calc Af Amer 13 (L) >60 mL/min   Anion gap 14 5 - 15    Comment: Performed at [facility], [address] [place], [place] [address]  CBC with Differential     Status: Abnormal   Collection Time: 10/02/19  2:40 PM  Result Value Ref Range   WBC 8.8 4.0 - 10.5 K/uL   RBC 2.99 (L) 4.22 - 5.81 MIL/uL   Hemoglobin 9.4 (L) 13.0 - 17.0 g/dL   HCT 31.1 (L) 39.0 - 52.0 %   MCV 104.0 (H) 80.0 - 100.0 fL   MCH 31.4 26.0 - 34.0 pg   MCHC 30.2 30.0 - 36.0 g/dL   RDW 14.3 11.5 - 15.5 %   Platelets 76 (L) 150 - 400 K/uL    Comment: REPEATED TO VERIFY PLATELET COUNT CONFIRMED BY SMEAR SPECIMEN CHECKED FOR CLOTS Immature Platelet Fraction may be clinically indicated, consider ordering this additional test [id number]    nRBC 0.0 0.0 - 0.2 %   Neutrophils Relative % 83 %   Neutro Abs 7.3 1.7 - 7.7 K/uL   Lymphocytes Relative 9 %   Lymphs Abs 0.8 0.7 - 4.0 K/uL   Monocytes Relative 6 %   Monocytes Absolute 0.5 0.1 - 1.0 K/uL   Eosinophils Relative 0 %   Eosinophils Absolute 0.0 0.0 - 0.5 K/uL   Basophils Relative 0 %   Basophils Absolute 0.0 0.0 - 0.1 K/uL   Immature Granulocytes 2 %   Abs Immature Granulocytes 0.15 (H) 0.00 - 0.07 K/uL    Comment: Performed at [facility], [address] [place], [place] [address]  CK     Status: Abnormal   Collection Time: 10/02/19  2:40 PM  Result Value Ref Range   Total CK 470 (H) 49 - 397 U/L    Comment: Performed at [facility], [address] [place], [place] [address]  CBG monitoring, ED     Status: Abnormal   Collection Time: 10/02/19  2:55 PM  Result Value Ref Range   Glucose-Capillary 193 (H) 70 - 99 mg/dL  Respiratory Panel by RT PCR (Flu A&B, Covid) - Nasopharyngeal Swab     Status: None   Collection Time: 10/02/19  3:03 PM   Specimen: Nasopharyngeal Swab  Result Value Ref Range   SARS Coronavirus 2 by RT PCR  NEGATIVE NEGATIVE    Comment: (NOTE) SARS-CoV-2 target nucleic acids are NOT DETECTED. The SARS-CoV-2 RNA is generally detectable in upper respiratoy specimens during the acute phase of infection. The lowest concentration of SARS-CoV-2 viral copies this assay can detect is 131 copies/mL. A negative result does   not preclude SARS-Cov-2 infection and should not be used as the sole basis for treatment or other patient management decisions. A negative result may occur with  improper specimen collection/handling, submission of specimen other than nasopharyngeal swab, presence of viral mutation(s) within the areas targeted by this assay, and inadequate number of viral copies (<131 copies/mL). A negative result must be combined with clinical observations, patient history, and epidemiological information. The expected result is Negative. Fact Sheet for Patients:  https://www.fda.gov/media/142436/download Fact Sheet for Healthcare Providers:  https://www.fda.gov/media/142435/download This test is not yet ap proved or cleared by the [place] FDA and  has been authorized for detection and/or diagnosis of SARS-CoV-2 by FDA under an Emergency Use Authorization (EUA). This EUA will remain  in effect (meaning this test can be used) for the duration of the COVID-19 declaration under Section 564(b)(1) of the Act, 21 U.S.C. section 360bbb-3(b)(1), unless the authorization is terminated or revoked sooner.    Influenza A by PCR NEGATIVE NEGATIVE   Influenza B by PCR NEGATIVE NEGATIVE    Comment: (NOTE) The Xpert Xpress SARS-CoV-2/FLU/RSV assay is intended as an aid in  the diagnosis of influenza from Nasopharyngeal swab specimens and  should not be used as a sole basis for treatment. Nasal washings and  aspirates are unacceptable for Xpert Xpress SARS-CoV-2/FLU/RSV  testing. Fact Sheet for Patients: https://www.fda.gov/media/142436/download Fact Sheet for Healthcare  Providers: https://www.fda.gov/media/142435/download This test is not yet approved or cleared by the [place] FDA and  has been authorized for detection and/or diagnosis of SARS-CoV-2 by  FDA under an Emergency Use Authorization (EUA). This EUA will remain  in effect (meaning this test can be used) for the duration of the  Covid-19 declaration under Section 564(b)(1) of the Act, 21  U.S.C. section 360bbb-3(b)(1), unless the authorization is  terminated or revoked. Performed at [facility], [address] [place], [place] [address]   Urinalysis, Routine w reflex microscopic     Status: Abnormal   Collection Time: 10/02/19  7:55 PM  Result Value Ref Range   Color, Urine AMBER (A) YELLOW    Comment: BIOCHEMICALS MAY BE AFFECTED BY COLOR   APPearance HAZY (A) CLEAR   Specific Gravity, Urine 1.016 1.005 - 1.030   pH 5.0 5.0 - 8.0   Glucose, UA NEGATIVE NEGATIVE mg/dL   Hgb urine dipstick MODERATE (A) NEGATIVE   Bilirubin Urine NEGATIVE NEGATIVE   Ketones, ur NEGATIVE NEGATIVE mg/dL   Protein, ur 30 (A) NEGATIVE mg/dL   Nitrite NEGATIVE NEGATIVE   Leukocytes,Ua LARGE (A) NEGATIVE   RBC / HPF 0-5 0 - 5 RBC/hpf   WBC, UA 11-20 0 - 5 WBC/hpf   Bacteria, UA MANY (A) NONE SEEN   Squamous Epithelial / LPF 0-5 0 - 5    Comment: Performed at [facility], [address] [place], [place] [address]  Osmolality     Status: Abnormal   Collection Time: 10/02/19  7:59 PM  Result Value Ref Range   Osmolality 406 (HH) 275 - 295 mOsm/kg    Comment: REPEATED TO VERIFY CRITICAL RESULT CALLED TO, READ BACK BY AND VERIFIED WITH: S TOWNES,RN 2031 10/02/2019 D BRADLEY Performed at [facility], [address] [place], [place] [address]   Osmolality, urine     Status: None   Collection Time: 10/02/19  8:16 PM  Result Value Ref Range   Osmolality, Ur 566 300 - 900 mOsm/kg    Comment: Performed at [facility], [address] [place], [place] [address]  CBG monitoring,  ED     Status: Abnormal   Collection Time: 10/02/19  8:47 PM  Result Value Ref Range   Glucose-Capillary 185 (H) 70 - 99 mg/dL   EEG  Result Date: 10/02/2019 Yadav, Priyanka O, MD     10/02/2019  4:56 PM Patient Name: Mark A Tassin Jr. MRN: [id number] Epilepsy Attending: Priyanka O Yadav Referring Physician/Provider: David Smith, PA Date: 10/02/2019 Duration: 23.11 mins Patient history: 73yo M with right frontal infarct, found at house altered and covered in urine , feces. Per EMS he had full body tremors concerning for seizure. EEG to evaluate for seizure Level of alertness: lethargic AEDs during EEG study: None Technical aspects: This EEG study was done with scalp electrodes positioned according to the 10-20 International system of electrode placement. Electrical activity was acquired at a sampling rate of 500Hz and reviewed with a high frequency filter of 70Hz and a low frequency filter of 1Hz. EEG data were recorded continuously and digitally stored. DESCRIPTION: EEG showed continuous generalized and lateralized right hemisphere 3-5Hz theta-delta slowing. EEG was reactive to tactile stimulation. Hyperventilation and photic stimulation were not performed. ABNORMALITY - Continuous slow, generalized and lateralized right hemisphere IMPRESSION: This study is suggestive of cortical dysfunction in right hemisphere likely secondary to underlying stroke as well as moderate diffuse encephalopathy, non specific to etiology. No seizures or epileptiform discharges were seen throughout the recording. Priyanka O Yadav   DG Clavicle Right  Result Date: 10/02/2019 CLINICAL DATA:  Fall. Pt was nonverbal for imaging. Pt did display sounds of pain when rolling for images of his left hip. Pt did not display any signs of pain or acknowledgement when imaging his right clavicle. EXAM: RIGHT CLAVICLE - 2+ VIEWS COMPARISON:  None. FINDINGS: No fracture or bone lesion. AC joint normally spaced and aligned. Small marginal spurs at  the AC joint. Glenohumeral joint is normally aligned. Soft tissues are unremarkable. IMPRESSION: 1. No fracture or dislocation. 2. Mild AC joint osteoarthritis. Electronically Signed   By: David  Ormond M.D.   On: 10/02/2019 16:08   DG Tibia/Fibula Left  Result Date: 10/02/2019 CLINICAL DATA:  Left leg pain. EXAM: LEFT TIBIA AND FIBULA - 2 VIEW COMPARISON:  Left hip evaluation from January 27th 2021 FINDINGS: Osteopenia. Signs of previous surgery about the left knee with a screw spanning the patella. There are wires which are fractured seen throughout the soft tissues about the knee. Soft tissue swelling is noted in the suprapatellar region partially imaged. Marked tricompartmental osteoarthritic changes are present. No signs of acute fracture or discrete bone abnormality aside from findings above. IMPRESSION: 1. No signs of acute fracture. 2. Postoperative changes about the patella with signs of hardware failure of unknown chronicity but associated with soft tissue swelling correlate with acute pain in this area. Comparison with prior imaging could be helpful. 3. Dedicated assessment of the knee may be warranted if there is focal pain in this area. 4. Marked tricompartmental osteoarthritic change. Electronically Signed   By: Geoffrey  Wile M.D.   On: 10/02/2019 18:10   CT Head Wo Contrast  Result Date: 10/02/2019 CLINICAL DATA:  Altered mental status. EXAM: CT HEAD WITHOUT CONTRAST TECHNIQUE: Contiguous axial images were obtained from the base of the skull through the vertex without intravenous contrast. COMPARISON:  September 11, 2019 FINDINGS: Brain: There is moderate severity cerebral atrophy with widening of the extra-axial spaces and ventricular dilatation. There are areas of decreased attenuation within the white matter tracts   of the supratentorial brain, consistent with microvascular disease changes. A small to moderate sized area of cortical encephalomalacia, with adjacent chronic white matter low  attenuation, is again seen within the right frontal lobe. Vascular: No hyperdense vessel or unexpected calcification. Skull: Normal. Negative for fracture or focal lesion. Sinuses/Orbits: No acute finding. Other: None. IMPRESSION: 1. Generalized cerebral atrophy. 2. Chronic right frontal lobe infarct. 3. No acute intracranial abnormality. Electronically Signed   By: Thaddeus  Houston M.D.   On: 10/02/2019 16:15   CT Cervical Spine Wo Contrast  Result Date: 10/02/2019 CLINICAL DATA:  Altered mental status with possible fall. EXAM: CT CERVICAL SPINE WITHOUT CONTRAST TECHNIQUE: Multidetector CT imaging of the cervical spine was performed without intravenous contrast. Multiplanar CT image reconstructions were also generated. COMPARISON:  None. FINDINGS: Alignment: Normal. Skull base and vertebrae: No acute fracture. No primary bone lesion or focal pathologic process. Soft tissues and spinal canal: No prevertebral fluid or swelling. No visible canal hematoma. Disc levels: C2-3: There is mild end plate spondylosis. Mild disc space narrowing is seen. Bilateral facet hypertrophy is noted. Normal central canal and intervertebral neuroforamina. C3-4: There is moderate to marked severity end plate spondylosis. Marked severity disc space narrowing is seen. Bilateral facet hypertrophy is noted. Normal central canal and intervertebral neuroforamina. C4-5: There is moderate to marked severity end plate spondylosis. Marked severity disc space narrowing is seen. Bilateral facet hypertrophy is noted. Normal central canal and intervertebral neuroforamina. C5-6: There is moderate to marked severity end plate spondylosis. Marked severity disc space narrowing is seen. Bilateral facet hypertrophy is noted. Normal central canal and intervertebral neuroforamina. C6-7: There is mild to moderate severity end plate spondylosis. Mild to moderate severity disc space narrowing is seen. Bilateral facet hypertrophy is noted. Normal central  canal and intervertebral neuroforamina. C7-T1: There is mild end plate spondylosis. Mild disc space narrowing is seen. Bilateral facet hypertrophy is noted. Normal central canal and intervertebral neuroforamina. Upper chest: Negative. Other: None. IMPRESSION: 1. Marked severity multilevel degenerative changes without evidence of acute osseous abnormality. Electronically Signed   By: Thaddeus  Houston M.D.   On: 10/02/2019 16:20   DG Pelvis Portable  Result Date: 10/02/2019 CLINICAL DATA:  Fall with left hip pain. EXAM: PORTABLE PELVIS 1-2 VIEWS COMPARISON:  None. FINDINGS: Left hip in internal rotation. No fracture visible given this projection. Suggest additional imaging if concern persists. IMPRESSION: Internally rotated left hip. No fracture visible. Additional images suggested if concern persists. Electronically Signed   By: Mark  Shogry M.D.   On: 10/02/2019 15:08   DG Chest Portable 1 View  Result Date: 10/02/2019 CLINICAL DATA:  Fell.  Altered mental status. EXAM: PORTABLE CHEST 1 VIEW COMPARISON:  06/28/2018 FINDINGS: Cardiomegaly. Aortic atherosclerosis. Right lung is clear. Minimal atelectasis or infiltrate at the left base. No evidence of heart failure or effusion. No regional traumatic bone finding. IMPRESSION: Mild left base atelectasis or infiltrate.  Otherwise negative. Electronically Signed   By: Mark  Shogry M.D.   On: 10/02/2019 15:09   DG Hip Unilat W or Wo Pelvis 2-3 Views Left  Result Date: 10/02/2019 CLINICAL DATA:  Fall. Pt was nonverbal for imaging. Pt did display sounds of pain when rolling for images of his left hip. Pt did not display any signs of pain or acknowledgement when imaging his right clavicle. EXAM: DG HIP (WITH OR WITHOUT PELVIS) 2-3V LEFT COMPARISON:  None. FINDINGS: No fracture or bone lesion. Left hip joint normally spaced and aligned. No arthropathic changes. Soft tissues are unremarkable. IMPRESSION:   Negative. Electronically Signed   By: David  Ormond M.D.   On:  10/02/2019 16:07   DG Femur Min 2 Views Left  Result Date: 10/02/2019 CLINICAL DATA:  Left leg pain. EXAM: LEFT FEMUR 2 VIEWS COMPARISON:  None. FINDINGS: There is no evidence of fracture or other focal bone lesions. A radiopaque surgical screw and thin curvilinear surgical sutures are seen within the left patella. Surgical sutures are also seen within the soft tissues superior to the left patella. There is a small joint effusion involving the left knee. IMPRESSION: 1. Postoperative changes involving the left knee, as described above. 2. No acute osseous abnormality. 3. Small left knee joint effusion. Electronically Signed   By: Thaddeus  Houston M.D.   On: 10/02/2019 18:07    Pending Labs Unresulted Labs (From admission, onward)    Start     Ordered   10/03/19 0500  CBC  Tomorrow morning,   R     10/02/19 2017   10/02/19 2200  Vitamin B12  Once,   R     10/02/19 2200   10/02/19 2030  Basic metabolic panel  Now then every 4 hours,   R (with STAT occurrences)     10/02/19 2017   10/02/19 2019  Hemoglobin A1c  Once,   STAT    Comments: To assess prior glycemic control    10/02/19 2018   10/02/19 2016  Procalcitonin - Baseline  Once,   STAT     10/02/19 2017   10/02/19 2015  Ammonia  Once,   STAT     10/02/19 2017   10/02/19 2015  TSH  Once,   STAT     10/02/19 2017          Vitals/Pain Today's Vitals   10/02/19 1900 10/02/19 2000 10/02/19 2050 10/02/19 2100  BP: (!) 152/80 (!) 134/99 (!) 148/74 (!) 140/57  Pulse:  (!) 107 (!) 51 (!) 54  Resp:  (!) 26 (!) 29 (!) 29  Temp:      TempSrc:      SpO2:  100% 100% 100%    Isolation Precautions No active isolations  Medications Medications  acetaminophen (TYLENOL) tablet 650 mg (has no administration in time range)    Or  acetaminophen (TYLENOL) suppository 650 mg (has no administration in time range)  dextrose 5 % solution ( Intravenous New Bag/Given 10/02/19 2049)  insulin aspart (novoLOG) injection 0-6 Units (1 Units  Subcutaneous Given 10/02/19 2054)  warfarin (COUMADIN) tablet 10 mg (0 mg Oral Hold 10/02/19 2105)  Warfarin - Pharmacist Dosing Inpatient (has no administration in time range)    Mobility non-ambulatory High fall risk   Focused Assessments Neuro Assessment Handoff:  Swallow screen pass? No    NIH Stroke Scale ( + Modified Stroke Scale Criteria)  Interval: Initial Level of Consciousness (1a.)   : Not alert, but arousable by minor stimulation to obey, answer, or respond LOC Questions (1b. )   +: Answers neither question correctly LOC Commands (1c. )   + : Performs one task correctly Best Gaze (2. )  +: Forced deviation Visual (3. )  +: Complete hemianopia Facial Palsy (4. )    : Normal symmetrical movements Motor Arm, Left (5a. )   +: No effort against gravity Motor Arm, Right (5b. )   +: No effort against gravity Motor Leg, Left (6a. )   +: Amputation or joint fusion(pt with ? fx to hip or leg) Motor Leg, Right (6b. )   +: No   effort against gravity Limb Ataxia (7. ): Absent Sensory (8. )   +: Normal, no sensory loss Best Language (9. )   +: Severe aphasia Dysarthria (10. ): Normal Extinction/Inattention (11.)   +: Visual/tactile/auditory/spatial/personal inattention Modified SS Total  +: 19 Complete NIHSS TOTAL: 20     Neuro Assessment: Exceptions to WDL Neuro Checks:   Initial (10/02/19 1439)  Last Documented NIHSS Modified Score: 19 (10/02/19 1439) Has TPA been given? No If patient is a Neuro Trauma and patient is going to OR before floor call report to [place] nurse: [phone] or [phone]     R Recommendations: See Admitting Provider Note  Report given to:   Additional Notes:

## 2019-10-03 ENCOUNTER — Inpatient Hospital Stay (HOSPITAL_COMMUNITY): Payer: PPO

## 2019-10-03 DIAGNOSIS — I82402 Acute embolism and thrombosis of unspecified deep veins of left lower extremity: Secondary | ICD-10-CM

## 2019-10-03 DIAGNOSIS — M25562 Pain in left knee: Secondary | ICD-10-CM

## 2019-10-03 LAB — GLUCOSE, CAPILLARY
Glucose-Capillary: 211 mg/dL — ABNORMAL HIGH (ref 70–99)
Glucose-Capillary: 202 mg/dL — ABNORMAL HIGH (ref 70–99)
Glucose-Capillary: 174 mg/dL — ABNORMAL HIGH (ref 70–99)
Glucose-Capillary: 211 mg/dL — ABNORMAL HIGH (ref 70–99)
Glucose-Capillary: 200 mg/dL — ABNORMAL HIGH (ref 70–99)
Glucose-Capillary: 204 mg/dL — ABNORMAL HIGH (ref 70–99)

## 2019-10-03 LAB — BASIC METABOLIC PANEL
Anion gap: 16 — ABNORMAL HIGH (ref 5–15)
Anion gap: 17 — ABNORMAL HIGH (ref 5–15)
Anion gap: 12 (ref 5–15)
Anion gap: 12 (ref 5–15)
BUN: 137 mg/dL — ABNORMAL HIGH (ref 8–23)
BUN: 135 mg/dL — ABNORMAL HIGH (ref 8–23)
BUN: 138 mg/dL — ABNORMAL HIGH (ref 8–23)
BUN: 136 mg/dL — ABNORMAL HIGH (ref 8–23)
CO2: 20 mmol/L — ABNORMAL LOW (ref 22–32)
CO2: 20 mmol/L — ABNORMAL LOW (ref 22–32)
CO2: 21 mmol/L — ABNORMAL LOW (ref 22–32)
CO2: 21 mmol/L — ABNORMAL LOW (ref 22–32)
Calcium: 9.6 mg/dL (ref 8.9–10.3)
Calcium: 9.6 mg/dL (ref 8.9–10.3)
Calcium: 9.2 mg/dL (ref 8.9–10.3)
Calcium: 9.2 mg/dL (ref 8.9–10.3)
Chloride: 128 mmol/L — ABNORMAL HIGH (ref 98–111)
Chloride: 128 mmol/L — ABNORMAL HIGH (ref 98–111)
Chloride: 129 mmol/L — ABNORMAL HIGH (ref 98–111)
Chloride: 128 mmol/L — ABNORMAL HIGH (ref 98–111)
Creatinine, Ser: 4.49 mg/dL — ABNORMAL HIGH (ref 0.61–1.24)
Creatinine, Ser: 4.34 mg/dL — ABNORMAL HIGH (ref 0.61–1.24)
Creatinine, Ser: 4.41 mg/dL — ABNORMAL HIGH (ref 0.61–1.24)
Creatinine, Ser: 4.56 mg/dL — ABNORMAL HIGH (ref 0.61–1.24)
GFR calc Af Amer: 14 mL/min — ABNORMAL LOW (ref >60)
GFR calc Af Amer: 15 mL/min — ABNORMAL LOW (ref >60)
GFR calc Af Amer: 14 mL/min — ABNORMAL LOW (ref >60)
GFR calc Af Amer: 14 mL/min — ABNORMAL LOW (ref >60)
GFR calc non Af Amer: 12 mL/min — ABNORMAL LOW (ref >60)
GFR calc non Af Amer: 13 mL/min — ABNORMAL LOW (ref >60)
GFR calc non Af Amer: 12 mL/min — ABNORMAL LOW (ref >60)
GFR calc non Af Amer: 12 mL/min — ABNORMAL LOW (ref >60)
Glucose, Bld: 267 mg/dL — ABNORMAL HIGH (ref 70–99)
Glucose, Bld: 223 mg/dL — ABNORMAL HIGH (ref 70–99)
Glucose, Bld: 205 mg/dL — ABNORMAL HIGH (ref 70–99)
Glucose, Bld: 243 mg/dL — ABNORMAL HIGH (ref 70–99)
Potassium: 4.6 mmol/L (ref 3.5–5.1)
Potassium: 4.4 mmol/L (ref 3.5–5.1)
Potassium: 4.2 mmol/L (ref 3.5–5.1)
Potassium: 4.4 mmol/L (ref 3.5–5.1)
Sodium: 164 mmol/L (ref 135–145)
Sodium: 165 mmol/L (ref 135–145)
Sodium: 162 mmol/L (ref 135–145)
Sodium: 161 mmol/L (ref 135–145)

## 2019-10-03 LAB — PROTIME-INR
INR: 2 — ABNORMAL HIGH (ref 0.8–1.2)
Prothrombin Time: 22.8 seconds — ABNORMAL HIGH (ref 11.4–15.2)

## 2019-10-03 LAB — CBC
HCT: 28.5 % — ABNORMAL LOW (ref 39.0–52.0)
Hemoglobin: 9.1 g/dL — ABNORMAL LOW (ref 13.0–17.0)
MCH: 32 pg (ref 26.0–34.0)
MCHC: 31.9 g/dL (ref 30.0–36.0)
MCV: 100.4 fL — ABNORMAL HIGH (ref 80.0–100.0)
Platelets: 75 10*3/uL — ABNORMAL LOW (ref 150–400)
RBC: 2.84 MIL/uL — ABNORMAL LOW (ref 4.22–5.81)
RDW: 14.3 % (ref 11.5–15.5)
WBC: 8.1 10*3/uL (ref 4.0–10.5)
nRBC: 0 % (ref 0.0–0.2)

## 2019-10-03 LAB — HEPARIN LEVEL (UNFRACTIONATED): Heparin Unfractionated: 0.21 IU/mL — ABNORMAL LOW (ref 0.30–0.70)

## 2019-10-03 MED ORDER — LEVOFLOXACIN IN D5W 750 MG/150ML IV SOLN
750.0000 mg | Freq: Once | INTRAVENOUS | Status: AC
  Administered 2019-10-03: 750 mg via INTRAVENOUS
  Filled 2019-10-03: qty 150

## 2019-10-03 MED ORDER — DEXTROSE 5 % IV BOLUS
250.0000 mL | Freq: Once | INTRAVENOUS | Status: AC
  Administered 2019-10-03: 250 mL via INTRAVENOUS

## 2019-10-03 MED ORDER — HEPARIN BOLUS VIA INFUSION
3000.0000 [IU] | Freq: Once | INTRAVENOUS | Status: AC
  Administered 2019-10-03: 3000 [IU] via INTRAVENOUS
  Filled 2019-10-03: qty 3000

## 2019-10-03 MED ORDER — LEVOFLOXACIN IN D5W 500 MG/100ML IV SOLN
500.0000 mg | INTRAVENOUS | Status: DC
  Administered 2019-10-05 – 2019-10-07 (×2): 500 mg via INTRAVENOUS
  Filled 2019-10-03 (×2): qty 100

## 2019-10-03 MED ORDER — HEPARIN (PORCINE) 25000 UT/250ML-% IV SOLN
1450.0000 [IU]/h | INTRAVENOUS | Status: DC
  Administered 2019-10-03: 1100 [IU]/h via INTRAVENOUS
  Administered 2019-10-04: 1300 [IU]/h via INTRAVENOUS
  Administered 2019-10-05: 1450 [IU]/h via INTRAVENOUS
  Administered 2019-10-05: 1300 [IU]/h via INTRAVENOUS
  Filled 2019-10-03 (×4): qty 250

## 2019-10-03 MED ORDER — ORAL CARE MOUTH RINSE
15.0000 mL | Freq: Two times a day (BID) | OROMUCOSAL | Status: DC
  Administered 2019-10-04 – 2019-10-10 (×11): 15 mL via OROMUCOSAL

## 2019-10-03 MED ORDER — CHLORHEXIDINE GLUCONATE 0.12 % MT SOLN
15.0000 mL | Freq: Two times a day (BID) | OROMUCOSAL | Status: DC
  Administered 2019-10-03 – 2019-10-10 (×13): 15 mL via OROMUCOSAL
  Filled 2019-10-03 (×10): qty 15

## 2019-10-03 MED ORDER — WARFARIN SODIUM 10 MG PO TABS: 10.0000 mg | ORAL_TABLET | Freq: Once | ORAL | Status: DC

## 2019-10-03 NOTE — Progress Notes (Addendum)
[place] for Warfarin Indication: VTE treatment   Allergies  Allergen Reactions  . Penicillins Swelling    Has patient had a PCN reaction causing immediate rash, facial/tongue/throat swelling, SOB or lightheadedness with hypotension: Yes Has patient had a PCN reaction causing severe rash involving mucus membranes or skin necrosis: No Has patient had a PCN reaction that required hospitalization:No Has patient had a PCN reaction occurring within the last 10 years: No If all of the above answers are "NO", then may proceed with Cephalosporin use.   . Molds & Smuts Other (See Comments)    Due to allergy testing.    Patient Measurements: Weight: 173 lb 15.1 oz (78.9 kg) Heparin Dosing Weight:   Vital Signs: Temp: 99.2 F (37.3 C) (01/28 0817) Temp Source: Oral (01/28 0817) BP: 120/68 (01/28 0817) Pulse Rate: 108 (01/28 0817)  Labs: Recent Labs    10/02/19 1440 10/02/19 1440 10/02/19 2101 10/03/19 0024 10/03/19 0403  HGB 9.4*  --   --   --  9.1*  HCT 31.1*  --   --   --  28.5*  PLT 76*  --   --   --  75*  LABPROT 19.2*  --   --   --   --   INR 1.6*  --   --   --   --   CREATININE 4.67*   < > 4.54* 4.49* 4.34*  CKTOTAL 470*  --   --   --   --    < > = values in this interval not displayed.    Estimated Creatinine Clearance: 16.1 mL/min (A) (by C-G formula based on SCr of 4.34 mg/dL (H)).   Medical History: Past Medical History:  Diagnosis Date  . Chronic kidney disease (CKD)   . DVT (deep venous thrombosis) ([place])   . Multiple myeloma ([place])   . Solitary kidney     Medications:  Scheduled:  . insulin aspart  0-6 Units Subcutaneous Q4H  . warfarin  10 mg Oral ONCE-1800  . Warfarin - Pharmacist Dosing Inpatient   Does not apply q1800    Assessment: Patient is a 40 yom that was found on the floor with decreases responsiveness. The patient's last known normal time was a couple of days ago. It is difficult to determine last  doses of warfarin and schedule. Pharmacy has been asked to continue this patients home warfarin.   PTA regimen appears to be Warfarin 10 mg Daily.  No INR done this am, will check now. Warfarin not given last night stated due to patient's inability to take po's. New orders received for heparin bridge. Ortho has seen and is considering taking to surgery once mental status improves. Will hold off on any further warfarin given need for surgery.    Goal of Therapy:  INR 2-3 Monitor platelets by anticoagulation protocol: Yes   Plan:  Start heparin bridge - 1100 units/hr Coags tonight  Erin Hearing PharmD., BCPS Clinical Pharmacist 10/03/2019 9:14 AM

## 2019-10-03 NOTE — Progress Notes (Signed)
Left lower ext venous study  has been completed. Refer to [facility] under chart review to view preliminary results.   10/03/2019  12:06 PM Jaskarn Schweer, Bonnye Fava

## 2019-10-03 NOTE — Consult Note (Signed)
Reason for Consult:Left knee pain Referring Physician: Robby Sermon. is an 74 y.o. male.  HPI: Mark Baird was admitted to the hospital with severe dehydration yesterday. X-rays were done of his lower leg which showed a hardware failure from a quad tendon repair in 2002 and orthopedic surgery was consulted. The patient continues to have AMS and cannot contribute to history.  Past Medical History:  Diagnosis Date  . Chronic kidney disease (CKD)   . DVT (deep venous thrombosis) ([place])   . Multiple myeloma ([place])   . Solitary kidney     Past Surgical History:  Procedure Laterality Date  . RIGHT/LEFT HEART CATH AND CORONARY ANGIOGRAPHY N/A 09/11/2017   Procedure: RIGHT/LEFT HEART CATH AND CORONARY ANGIOGRAPHY;  Surgeon: Nigel Mormon, MD;  Location: [facility];  Service: Cardiovascular;  Laterality: N/A;    Family History  Problem Relation Age of Onset  . Emphysema Father     Social History:  reports that he quit smoking about 30 years ago. His smoking use included cigarettes. He has a 30.00 pack-year smoking history. He has never used smokeless tobacco. He reports that he does not drink alcohol. No history on file for drug.  Allergies:  Allergies  Allergen Reactions  . Penicillins Swelling    Has patient had a PCN reaction causing immediate rash, facial/tongue/throat swelling, SOB or lightheadedness with hypotension: Yes Has patient had a PCN reaction causing severe rash involving mucus membranes or skin necrosis: No Has patient had a PCN reaction that required hospitalization:No Has patient had a PCN reaction occurring within the last 10 years: No If all of the above answers are "NO", then may proceed with Cephalosporin use.   . Molds & Smuts Other (See Comments)    Due to allergy testing.    Medications: I have reviewed the patient's current medications.  Results for orders placed or performed during the hospital encounter of 10/02/19 (from the past 48  hour(s))  Protime-INR     Status: Abnormal   Collection Time: 10/02/19  2:40 PM  Result Value Ref Range   Prothrombin Time 19.2 (H) 11.4 - 15.2 seconds   INR 1.6 (H) 0.8 - 1.2    Comment: (NOTE) INR goal varies based on device and disease states. Performed at [facility], [place] [address], [place], [place] [address]   Comprehensive metabolic panel     Status: Abnormal   Collection Time: 10/02/19  2:40 PM  Result Value Ref Range   Sodium 162 (HH) 135 - 145 mmol/L    Comment: CRITICAL RESULT CALLED TO, READ BACK BY AND VERIFIED WITH: BAIN.C RN 2947 10/02/19 [email or web address]     Potassium 4.4 3.5 - 5.1 mmol/L   Chloride 129 (H) 98 - 111 mmol/L   CO2 19 (L) 22 - 32 mmol/L   Glucose, Bld 218 (H) 70 - 99 mg/dL   BUN 136 (H) 8 - 23 mg/dL   Creatinine, Ser 4.67 (H) 0.61 - 1.24 mg/dL   Calcium 9.4 8.9 - 10.3 mg/dL   Total Protein 10.5 (H) 6.5 - 8.1 g/dL   Albumin 2.6 (L) 3.5 - 5.0 g/dL   AST 30 15 - 41 U/L   ALT 43 0 - 44 U/L   Alkaline Phosphatase 96 38 - 126 U/L   Total Bilirubin 1.5 (H) 0.3 - 1.2 mg/dL   GFR calc non Af Amer 12 (L) >60 mL/min   GFR calc Af Amer 13 (L) >60 mL/min   Anion gap  14 5 - 15    Comment: Performed at [facility], [place] [address] [place], [place] [address]  CBC with Differential     Status: Abnormal   Collection Time: 10/02/19  2:40 PM  Result Value Ref Range   WBC 8.8 4.0 - 10.5 K/uL   RBC 2.99 (L) 4.22 - 5.81 MIL/uL   Hemoglobin 9.4 (L) 13.0 - 17.0 g/dL   HCT 31.1 (L) 39.0 - 52.0 %   MCV 104.0 (H) 80.0 - 100.0 fL   MCH 31.4 26.0 - 34.0 pg   MCHC 30.2 30.0 - 36.0 g/dL   RDW 14.3 11.5 - 15.5 %   Platelets 76 (L) 150 - 400 K/uL    Comment: REPEATED TO VERIFY PLATELET COUNT CONFIRMED BY SMEAR SPECIMEN CHECKED FOR CLOTS Immature Platelet Fraction may be clinically indicated, consider ordering this additional test [id number]    nRBC 0.0 0.0 - 0.2 %   Neutrophils Relative % 83 %   Neutro Abs 7.3 1.7 - 7.7 K/uL   Lymphocytes Relative 9  %   Lymphs Abs 0.8 0.7 - 4.0 K/uL   Monocytes Relative 6 %   Monocytes Absolute 0.5 0.1 - 1.0 K/uL   Eosinophils Relative 0 %   Eosinophils Absolute 0.0 0.0 - 0.5 K/uL   Basophils Relative 0 %   Basophils Absolute 0.0 0.0 - 0.1 K/uL   Immature Granulocytes 2 %   Abs Immature Granulocytes 0.15 (H) 0.00 - 0.07 K/uL    Comment: Performed at [facility], [place] [address], [place], [place] [address]  CK     Status: Abnormal   Collection Time: 10/02/19  2:40 PM  Result Value Ref Range   Total CK 470 (H) 49 - 397 U/L    Comment: Performed at [facility], [place] [address], [place], [place] [address]  CBG monitoring, ED     Status: Abnormal   Collection Time: 10/02/19  2:55 PM  Result Value Ref Range   Glucose-Capillary 193 (H) 70 - 99 mg/dL  Respiratory Panel by RT PCR (Flu A&B, Covid) - Nasopharyngeal Swab     Status: None   Collection Time: 10/02/19  3:03 PM   Specimen: Nasopharyngeal Swab  Result Value Ref Range   SARS Coronavirus 2 by RT PCR NEGATIVE NEGATIVE    Comment: (NOTE) SARS-CoV-2 target nucleic acids are NOT DETECTED. The SARS-CoV-2 RNA is generally detectable in upper respiratoy specimens during the acute phase of infection. The lowest concentration of SARS-CoV-2 viral copies this assay can detect is 131 copies/mL. A negative result does not preclude SARS-Cov-2 infection and should not be used as the sole basis for treatment or other patient management decisions. A negative result may occur with  improper specimen collection/handling, submission of specimen other than nasopharyngeal swab, presence of viral mutation(s) within the areas targeted by this assay, and inadequate number of viral copies (<131 copies/mL). A negative result must be combined with clinical observations, patient history, and epidemiological information. The expected result is Negative. Fact Sheet for Patients:  PinkCheek.be Fact Sheet for Healthcare Providers:   GravelBags.it This test is not yet ap proved or cleared by the [place] FDA and  has been authorized for detection and/or diagnosis of SARS-CoV-2 by FDA under an Emergency Use Authorization (EUA). This EUA will remain  in effect (meaning this test can be used) for the duration of the COVID-19 declaration under Section 564(b)(1) of the Act, 21 U.S.C. section 360bbb-3(b)(1), unless the authorization is terminated or  revoked sooner.    Influenza A by PCR NEGATIVE NEGATIVE   Influenza B by PCR NEGATIVE NEGATIVE    Comment: (NOTE) The Xpert Xpress SARS-CoV-2/FLU/RSV assay is intended as an aid in  the diagnosis of influenza from Nasopharyngeal swab specimens and  should not be used as a sole basis for treatment. Nasal washings and  aspirates are unacceptable for Xpert Xpress SARS-CoV-2/FLU/RSV  testing. Fact Sheet for Patients: PinkCheek.be Fact Sheet for Healthcare Providers: GravelBags.it This test is not yet approved or cleared by the [place] FDA and  has been authorized for detection and/or diagnosis of SARS-CoV-2 by  FDA under an Emergency Use Authorization (EUA). This EUA will remain  in effect (meaning this test can be used) for the duration of the  Covid-19 declaration under Section 564(b)(1) of the Act, 21  U.S.C. section 360bbb-3(b)(1), unless the authorization is  terminated or revoked. Performed at [facility], [place] [address] [place], [place] [address]   Urinalysis, Routine w reflex microscopic     Status: Abnormal   Collection Time: 10/02/19  7:55 PM  Result Value Ref Range   Color, Urine AMBER (A) YELLOW    Comment: BIOCHEMICALS MAY BE AFFECTED BY COLOR   APPearance HAZY (A) CLEAR   Specific Gravity, Urine 1.016 1.005 - 1.030   pH 5.0 5.0 - 8.0   Glucose, UA NEGATIVE NEGATIVE mg/dL   Hgb urine dipstick MODERATE (A) NEGATIVE   Bilirubin Urine NEGATIVE NEGATIVE    Ketones, ur NEGATIVE NEGATIVE mg/dL   Protein, ur 30 (A) NEGATIVE mg/dL   Nitrite NEGATIVE NEGATIVE   Leukocytes,Ua LARGE (A) NEGATIVE   RBC / HPF 0-5 0 - 5 RBC/hpf   WBC, UA 11-20 0 - 5 WBC/hpf   Bacteria, UA MANY (A) NONE SEEN   Squamous Epithelial / LPF 0-5 0 - 5    Comment: Performed at [facility], [place] [address] [place], [place] [address]  Osmolality     Status: Abnormal   Collection Time: 10/02/19  7:59 PM  Result Value Ref Range   Osmolality 406 (HH) 275 - 295 mOsm/kg    Comment: REPEATED TO VERIFY CRITICAL RESULT CALLED TO, READ BACK BY AND VERIFIED WITH: Cyndee Brightly 2031 10/02/2019 D BRADLEY Performed at [facility], [place] [address], [place], [place] [address]   Osmolality, urine     Status: None   Collection Time: 10/02/19  8:16 PM  Result Value Ref Range   Osmolality, Ur 566 300 - 900 mOsm/kg    Comment: Performed at [facility] [address] [place], [place] [address]  CBG monitoring, ED     Status: Abnormal   Collection Time: 10/02/19  8:47 PM  Result Value Ref Range   Glucose-Capillary 185 (H) 70 - 99 mg/dL  Procalcitonin - Baseline     Status: None   Collection Time: 10/02/19  9:01 PM  Result Value Ref Range   Procalcitonin 0.71 ng/mL    Comment:        Interpretation: PCT > 0.5 ng/mL and <= 2 ng/mL: Systemic infection (sepsis) is possible, but other conditions are known to elevate PCT as well. (NOTE)       Sepsis PCT Algorithm           Lower Respiratory Tract  Infection PCT Algorithm    ----------------------------     ----------------------------         PCT < 0.25 ng/mL                PCT < 0.10 ng/mL         Strongly encourage             Strongly discourage   discontinuation of antibiotics    initiation of antibiotics    ----------------------------     -----------------------------       PCT 0.25 - 0.50 ng/mL            PCT 0.10 - 0.25 ng/mL               OR       >80% decrease in PCT             Discourage initiation of                                            antibiotics      Encourage discontinuation           of antibiotics    ----------------------------     -----------------------------         PCT >= 0.50 ng/mL              PCT 0.26 - 0.50 ng/mL                AND       <80% decrease in PCT             Encourage initiation of                                             antibiotics       Encourage continuation           of antibiotics    ----------------------------     -----------------------------        PCT >= 0.50 ng/mL                  PCT > 0.50 ng/mL               AND         increase in PCT                  Strongly encourage                                      initiation of antibiotics    Strongly encourage escalation           of antibiotics                                     -----------------------------                                           PCT <= 0.25 ng/mL  OR                                        > 80% decrease in PCT                                     Discontinue / Do not initiate                                             antibiotics Performed at [facility], [place] [address] [place], [place] [address]   Basic metabolic panel     Status: Abnormal   Collection Time: 10/02/19  9:01 PM  Result Value Ref Range   Sodium 164 (HH) 135 - 145 mmol/L    Comment: CRITICAL RESULT CALLED TO, READ BACK BY AND VERIFIED WITH: TOWNES [facility] 10/02/19 2134 WAYK    Potassium 4.6 3.5 - 5.1 mmol/L   Chloride 130 (H) 98 - 111 mmol/L   CO2 19 (L) 22 - 32 mmol/L   Glucose, Bld 208 (H) 70 - 99 mg/dL   BUN 140 (H) 8 - 23 mg/dL   Creatinine, Ser 4.54 (H) 0.61 - 1.24 mg/dL   Calcium 9.6 8.9 - 10.3 mg/dL   GFR calc non Af Amer 12 (L) >60 mL/min   GFR calc Af Amer 14 (L) >60 mL/min   Anion gap 15 5 - 15    Comment: Performed at [facility] [address] [place], [place] [address]  Hemoglobin A1c      Status: Abnormal   Collection Time: 10/02/19  9:01 PM  Result Value Ref Range   Hgb A1c MFr Bld 6.3 (H) 4.8 - 5.6 %    Comment: (NOTE) Pre diabetes:          5.7%-6.4% Diabetes:              >6.4% Glycemic control for   <7.0% adults with diabetes    Mean Plasma Glucose 134.11 mg/dL    Comment: Performed at [facility] [address], [place], [place] [address]  Ammonia     Status: None   Collection Time: 10/02/19  9:07 PM  Result Value Ref Range   Ammonia 11 9 - 35 umol/L    Comment: Performed at [facility], [place] [address], [place], [place] [address]  TSH     Status: Abnormal   Collection Time: 10/02/19  9:07 PM  Result Value Ref Range   TSH 0.320 (L) 0.350 - 4.500 uIU/mL    Comment: Performed by a 3rd Generation assay with a functional sensitivity of <=0.01 uIU/mL. Performed at [facility], [place] [address], [place], [place] [address]   Vitamin B12     Status: None   Collection Time: 10/02/19  9:07 PM  Result Value Ref Range   Vitamin B-12 380 180 - 914 pg/mL    Comment: (NOTE) This assay is not validated for testing neonatal or myeloproliferative syndrome specimens for Vitamin B12 levels. Performed at [facility], [place] [address] [place], [place] [address]   Basic metabolic panel     Status: Abnormal   Collection Time: 10/03/19 12:24 AM  Result Value  Ref Range   Sodium 164 (HH) 135 - 145 mmol/L    Comment: CRITICAL RESULT CALLED TO, READ BACK BY AND VERIFIED WITH: PUCKETT R,RN 10/03/19 0101 WAYK    Potassium 4.6 3.5 - 5.1 mmol/L   Chloride 128 (H) 98 - 111 mmol/L   CO2 20 (L) 22 - 32 mmol/L   Glucose, Bld 267 (H) 70 - 99 mg/dL   BUN 137 (H) 8 - 23 mg/dL   Creatinine, Ser 4.49 (H) 0.61 - 1.24 mg/dL   Calcium 9.6 8.9 - 10.3 mg/dL   GFR calc non Af Amer 12 (L) >60 mL/min   GFR calc Af Amer 14 (L) >60 mL/min   Anion gap 16 (H) 5 - 15    Comment: Performed at [facility], [place] [address] [place], [place] [address]  Glucose,  capillary     Status: Abnormal   Collection Time: 10/03/19  1:09 AM  Result Value Ref Range   Glucose-Capillary 211 (H) 70 - 99 mg/dL  CBC     Status: Abnormal   Collection Time: 10/03/19  4:03 AM  Result Value Ref Range   WBC 8.1 4.0 - 10.5 K/uL   RBC 2.84 (L) 4.22 - 5.81 MIL/uL   Hemoglobin 9.1 (L) 13.0 - 17.0 g/dL   HCT 28.5 (L) 39.0 - 52.0 %   MCV 100.4 (H) 80.0 - 100.0 fL   MCH 32.0 26.0 - 34.0 pg   MCHC 31.9 30.0 - 36.0 g/dL   RDW 14.3 11.5 - 15.5 %   Platelets 75 (L) 150 - 400 K/uL    Comment: Immature Platelet Fraction may be clinically indicated, consider ordering this additional test [id number] CONSISTENT WITH PREVIOUS RESULT    nRBC 0.0 0.0 - 0.2 %    Comment: Performed at [facility], [place] [address] [place], [place] [address]  Basic metabolic panel     Status: Abnormal   Collection Time: 10/03/19  4:03 AM  Result Value Ref Range   Sodium 165 (HH) 135 - 145 mmol/L    Comment: CRITICAL RESULT CALLED TO, READ BACK BY AND VERIFIED WITH: PUCKETT R,RN 10/03/19 0521 WAYK    Potassium 4.4 3.5 - 5.1 mmol/L   Chloride 128 (H) 98 - 111 mmol/L   CO2 20 (L) 22 - 32 mmol/L   Glucose, Bld 223 (H) 70 - 99 mg/dL   BUN 135 (H) 8 - 23 mg/dL   Creatinine, Ser 4.34 (H) 0.61 - 1.24 mg/dL   Calcium 9.6 8.9 - 10.3 mg/dL   GFR calc non Af Amer 13 (L) >60 mL/min   GFR calc Af Amer 15 (L) >60 mL/min   Anion gap 17 (H) 5 - 15    Comment: Performed at [facility] [address] [place], [place] [address]  Glucose, capillary     Status: Abnormal   Collection Time: 10/03/19  4:55 AM  Result Value Ref Range   Glucose-Capillary 202 (H) 70 - 99 mg/dL  Basic metabolic panel     Status: Abnormal   Collection Time: 10/03/19  8:02 AM  Result Value Ref Range   Sodium 162 (HH) 135 - 145 mmol/L    Comment: CRITICAL RESULT CALLED TO, READ BACK BY AND VERIFIED WITH: PONSOLOE,M RN @ 0910 10/03/19 LEONARD,A    Potassium 4.2 3.5 - 5.1 mmol/L   Chloride 129 (H) 98 - 111 mmol/L   CO2  21 (L) 22 - 32 mmol/L   Glucose, Bld 205 (H) 70 - 99  mg/dL   BUN 138 (H) 8 - 23 mg/dL   Creatinine, Ser 4.41 (H) 0.61 - 1.24 mg/dL   Calcium 9.2 8.9 - 10.3 mg/dL   GFR calc non Af Amer 12 (L) >60 mL/min   GFR calc Af Amer 14 (L) >60 mL/min   Anion gap 12 5 - 15    Comment: Performed at [facility] [address] [place], [place] [address]  Glucose, capillary     Status: Abnormal   Collection Time: 10/03/19  8:15 AM  Result Value Ref Range   Glucose-Capillary 174 (H) 70 - 99 mg/dL   Comment 1 Notify RN     EEG  Result Date: 10/02/2019 Lora Havens, MD     10/02/2019  4:56 PM Patient Name: Mark Baird. MRN: [id number] Epilepsy Attending: Lora Havens Referring Physician/Provider: Etta Quill, PA Date: 10/02/2019 Duration: 23.11 mins Patient history: 74yo M with right frontal infarct, found at house altered and covered in urine , feces. Per EMS he had full body tremors concerning for seizure. EEG to evaluate for seizure Level of alertness: lethargic AEDs during EEG study: None Technical aspects: This EEG study was done with scalp electrodes positioned according to the 10-20 International system of electrode placement. Electrical activity was acquired at a sampling rate of 500Hz  and reviewed with a high frequency filter of 70Hz  and a low frequency filter of 1Hz . EEG data were recorded continuously and digitally stored. DESCRIPTION: EEG showed continuous generalized and lateralized right hemisphere 3-5Hz  theta-delta slowing. EEG was reactive to tactile stimulation. Hyperventilation and photic stimulation were not performed. ABNORMALITY - Continuous slow, generalized and lateralized right hemisphere IMPRESSION: This study is suggestive of cortical dysfunction in right hemisphere likely secondary to underlying stroke as well as moderate diffuse encephalopathy, non specific to etiology. No seizures or epileptiform discharges were seen throughout the recording. Lora Havens   DG  Clavicle Right  Result Date: 10/02/2019 CLINICAL DATA:  Fall. Pt was nonverbal for imaging. Pt did display sounds of pain when rolling for images of his left hip. Pt did not display any signs of pain or acknowledgement when imaging his right clavicle. EXAM: RIGHT CLAVICLE - 2+ VIEWS COMPARISON:  None. FINDINGS: No fracture or bone lesion. AC joint normally spaced and aligned. Small marginal spurs at the [facility] joint. Glenohumeral joint is normally aligned. Soft tissues are unremarkable. IMPRESSION: 1. No fracture or dislocation. 2. Mild AC joint osteoarthritis. Electronically Signed   By: Lajean Manes M.D.   On: 10/02/2019 16:08   DG Tibia/Fibula Left  Result Date: 10/02/2019 CLINICAL DATA:  Left leg pain. EXAM: LEFT TIBIA AND FIBULA - 2 VIEW COMPARISON:  Left hip evaluation from January 27th 2021 FINDINGS: Osteopenia. Signs of previous surgery about the left knee with a screw spanning the patella. There are wires which are fractured seen throughout the soft tissues about the knee. Soft tissue swelling is noted in the suprapatellar region partially imaged. Marked tricompartmental osteoarthritic changes are present. No signs of acute fracture or discrete bone abnormality aside from findings above. IMPRESSION: 1. No signs of acute fracture. 2. Postoperative changes about the patella with signs of hardware failure of unknown chronicity but associated with soft tissue swelling correlate with acute pain in this area. Comparison with prior imaging could be helpful. 3. Dedicated assessment of the knee may be warranted if there is focal pain in this area. 4. Marked tricompartmental osteoarthritic change. Electronically Signed   By: Jewel Baize.D.  On: 10/02/2019 18:10   CT Head Wo Contrast  Result Date: 10/02/2019 CLINICAL DATA:  Altered mental status. EXAM: CT HEAD WITHOUT CONTRAST TECHNIQUE: Contiguous axial images were obtained from the base of the skull through the vertex without intravenous contrast.  COMPARISON:  September 11, 2019 FINDINGS: Brain: There is moderate severity cerebral atrophy with widening of the extra-axial spaces and ventricular dilatation. There are areas of decreased attenuation within the white matter tracts of the supratentorial brain, consistent with microvascular disease changes. A small to moderate sized area of cortical encephalomalacia, with adjacent chronic white matter low attenuation, is again seen within the right frontal lobe. Vascular: No hyperdense vessel or unexpected calcification. Skull: Normal. Negative for fracture or focal lesion. Sinuses/Orbits: No acute finding. Other: None. IMPRESSION: 1. Generalized cerebral atrophy. 2. Chronic right frontal lobe infarct. 3. No acute intracranial abnormality. Electronically Signed   By: Virgina Norfolk M.D.   On: 10/02/2019 16:15   CT Cervical Spine Wo Contrast  Result Date: 10/02/2019 CLINICAL DATA:  Altered mental status with possible fall. EXAM: CT CERVICAL SPINE WITHOUT CONTRAST TECHNIQUE: Multidetector CT imaging of the cervical spine was performed without intravenous contrast. Multiplanar CT image reconstructions were also generated. COMPARISON:  None. FINDINGS: Alignment: Normal. Skull base and vertebrae: No acute fracture. No primary bone lesion or focal pathologic process. Soft tissues and spinal canal: No prevertebral fluid or swelling. No visible canal hematoma. Disc levels: C2-3: There is mild end plate spondylosis. Mild disc space narrowing is seen. Bilateral facet hypertrophy is noted. Normal central canal and intervertebral neuroforamina. C3-4: There is moderate to marked severity end plate spondylosis. Marked severity disc space narrowing is seen. Bilateral facet hypertrophy is noted. Normal central canal and intervertebral neuroforamina. C4-5: There is moderate to marked severity end plate spondylosis. Marked severity disc space narrowing is seen. Bilateral facet hypertrophy is noted. Normal central canal and  intervertebral neuroforamina. C5-6: There is moderate to marked severity end plate spondylosis. Marked severity disc space narrowing is seen. Bilateral facet hypertrophy is noted. Normal central canal and intervertebral neuroforamina. C6-7: There is mild to moderate severity end plate spondylosis. Mild to moderate severity disc space narrowing is seen. Bilateral facet hypertrophy is noted. Normal central canal and intervertebral neuroforamina. C7-T1: There is mild end plate spondylosis. Mild disc space narrowing is seen. Bilateral facet hypertrophy is noted. Normal central canal and intervertebral neuroforamina. Upper chest: Negative. Other: None. IMPRESSION: 1. Marked severity multilevel degenerative changes without evidence of acute osseous abnormality. Electronically Signed   By: Virgina Norfolk M.D.   On: 10/02/2019 16:20   DG Pelvis Portable  Result Date: 10/02/2019 CLINICAL DATA:  Fall with left hip pain. EXAM: PORTABLE PELVIS 1-2 VIEWS COMPARISON:  None. FINDINGS: Left hip in internal rotation. No fracture visible given this projection. Suggest additional imaging if concern persists. IMPRESSION: Internally rotated left hip. No fracture visible. Additional images suggested if concern persists. Electronically Signed   By: Nelson Chimes M.D.   On: 10/02/2019 15:08   DG Chest Portable 1 View  Result Date: 10/02/2019 CLINICAL DATA:  [place].  Altered mental status. EXAM: PORTABLE CHEST 1 VIEW COMPARISON:  06/28/2018 FINDINGS: Cardiomegaly. Aortic atherosclerosis. Right lung is clear. Minimal atelectasis or infiltrate at the left base. No evidence of heart failure or effusion. No regional traumatic bone finding. IMPRESSION: Mild left base atelectasis or infiltrate.  Otherwise negative. Electronically Signed   By: Nelson Chimes M.D.   On: 10/02/2019 15:09   DG Hip Unilat W or Wo Pelvis 2-3 Views Left  Result Date: 10/02/2019 CLINICAL DATA:  Fall. Pt was nonverbal for imaging. Pt did display sounds of pain  when rolling for images of his left hip. Pt did not display any signs of pain or acknowledgement when imaging his right clavicle. EXAM: DG HIP (WITH OR WITHOUT PELVIS) 2-3V LEFT COMPARISON:  None. FINDINGS: No fracture or bone lesion. Left hip joint normally spaced and aligned. No arthropathic changes. Soft tissues are unremarkable. IMPRESSION: Negative. Electronically Signed   By: Lajean Manes M.D.   On: 10/02/2019 16:07   DG Femur Min 2 Views Left  Result Date: 10/02/2019 CLINICAL DATA:  Left leg pain. EXAM: LEFT FEMUR 2 VIEWS COMPARISON:  None. FINDINGS: There is no evidence of fracture or other focal bone lesions. A radiopaque surgical screw and thin curvilinear surgical sutures are seen within the left patella. Surgical sutures are also seen within the soft tissues superior to the left patella. There is a small joint effusion involving the left knee. IMPRESSION: 1. Postoperative changes involving the left knee, as described above. 2. No acute osseous abnormality. 3. Small left knee joint effusion. Electronically Signed   By: Virgina Norfolk M.D.   On: 10/02/2019 18:07    Review of Systems  Unable to perform ROS: Mental status change   Blood pressure 120/68, pulse (!) 108, temperature 99.2 F (37.3 C), temperature source Oral, resp. rate (!) 29, weight 78.9 kg, SpO2 100 %. Physical Exam  Constitutional: He appears well-developed and well-nourished. No distress.  HENT:  Head: Normocephalic and atraumatic.  Eyes: Conjunctivae are normal. Right eye exhibits no discharge. Left eye exhibits no discharge. No scleral icterus.  Cardiovascular: Normal rate and regular rhythm.  Respiratory: Effort normal. No respiratory distress.  Musculoskeletal:     Cervical back: Normal range of motion.     Comments: LLE No traumatic wounds, ecchymosis, or rash  Proud hardware tenting lateral knee, TTP, mod pain with PROM  Mod knee effusion  Knee stable to varus/ valgus and anterior/posterior stress  Sens  DPN, SPN, TN intact  Motor EHL, ext, flex, evers grossly intact  DP 1+, PT 0, 3+ pitting edema  Neurological: He is alert.  Skin: Skin is warm and dry. He is not diaphoretic.  Psychiatric: He has a normal mood and affect. His behavior is normal.    Assessment/Plan: Left knee hardware failure -- Will need removal of hardware once more medically stable. Request conversion of coumadin to IV heparin until surgery can occur. 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

## 2019-10-03 NOTE — Progress Notes (Signed)
PROGRESS NOTE    Mark Baird.  MKL:[id number] DOB: Jun 02, 1946 DOA: 10/02/2019 PCP: Sandi Mariscal, MD   Brief Narrative:  Mark Baird. is a 74 y.o. 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  Patient was found laying on the couch by his neighbor who called 911.  He was covered in urine and feces.  Last known normal a couple of days ago. EMS reported full body tremors in route and patient was given Versed.  No history could be obtained from the patient due to his altered mental status. In the ED he was afebrile, had tachycardia and tachypnea with normal BP. Had CT head, C-spine, pelvic Xray, left hip Xray, left femur X-ray, right clavicle X-ray that were all negative for acute process.  X-ray of left tibia and fibula showed sings of hardware failure around the patella.  His Scr was elevated to 4.6 from baseline of 2. Sodium level was elevated at 164.  COVID-19 and influenza negative.   Assessment & Plan:   Principal Problem:   Hypernatremia Active Problems:   Chronic renal insufficiency   AKI (acute kidney injury) ([place])   Toxic metabolic encephalopathy   Thrombocytopenia ([facility])   Left knee pain  Severe hypernatremia Suspect related to severe dehydration.  Corrected sodium 164.  Patient was found down laying on his couch by his neighbor covered in urine and feces.  Unclear how long he has been in this condition.  Remains dehydrated.  -Continue D5W _0  cc/hr -Monitor sodium level every 4 hours and adjust rate of fluids accordingly.  Goal rate of correction 10-12 meq in 24 hours. Sodium level is gradually improving.   AKI on CKD stage 3 Suspect prerenal due to severe dehydration. BUN 136, creatinine 4.6.  Baseline creatinine 1.6-2.0. -Continue IV fluid  hydration -Monitor renal function and urine output, start bladder scan -Avoid nephrotoxic agents/contrast  Toxic metabolic encephalopathy Patient was seen by neurology and his presentation is thought to be due to toxic metabolic encephalopathy likely secondary to hyponatremia and AKI on CKD.  EEG was done in the ED and did not show evidence of seizure or status epilepticus.  Head CT negative for acute intracranial abnormality.  His presentation is not thought to be due to a CNS infection and neurology not recommending LP at this time. TSH mildly suppressed. Ammonia level wnl.  -Continue management of hypernatremia and AKI as mentioned above -Frequent neurochecks   Left lower extremity pain Patient currently appears comfortable, however, upon assessment done by ED provider, it was felt that he was experiencing pain in his left hip/lower extremity.  Pelvic x-ray without evidence of fracture. X-ray of left hip/pelvis negative for fracture. X-ray of left femur showing small left knee joint effusion and no acute osseous abnormality. X-ray of left tibia/fibula negative for fracture.  Showing postop changes about the patella with signs of hardware failure of unknown chronicity associated with soft tissue swelling.  Marked tricompartmental osteoarthritic change. Orthopedic Surgery has evaluated with plan for eventually surgical intervention.    Chronic combined systolic and diastolic congestive heart failure Echo done May 2020 with LVEF 20% and grade 1 diastolic dysfunction.  Suspect left lower extremity edema is related to history of DVT.  Otherwise, does not appear clinically volume overloaded on exam.  Chest x-ray without evidence of volume overload.  Appears  very dehydrated. -Hold diuretic at this time  History of LLE DVT in May 2020 Patient is on chronic anticoagulation with Coumadin but INR currently subtherapeutic at 1.6 on presentation.  -Hold coumadin due to anticipated surgical  intervention.  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  No signs of active bleeding. -Continue to monitor  Normal anion gap metabolic acidosis Bicarb 19, anion gap 14.  Suspect related to AKI.   -Continue IV fluid hydration -Continue to monitor  Hyperglycemia Blood glucose 218.  No documented history of diabetes. Hgb A1c of 6.3%. -Continue  Sliding scale insulin very sensitive every 4 hours as patient is on D5W for hypernatremia.  Pneumonia, poa with elevated pct Chest x-ray showing minimal atelectasis or infiltrate at the left lung base.  Continue renally dosed Levaquin.   DVT prophylaxis: heparin drip Code Status: Full code Family Communication: No family available at this time. Patient reports that he lives alone, not sure of who I could call to update but he will think about it.  Disposition Plan: Anticipate discharge after clinical improvement. Consults called: Neurology, orthopedics Procedures:  None Antimicrobials:  Levaquin 1/27>> Subjective: Patient feels tired. Denies pain. Slow to respond but following commands.   Objective: Vitals:   10/03/19 0500 10/03/19 0817 10/03/19 1123 10/03/19 1537  BP: 104/63 120/68 110/71 (!) 117/59  Pulse: (!) 112 (!) 108 (!) 107 (!) 50  Resp: (!) 29 (!) 29 (!) 22 (!) 25  Temp: 99.2 F (37.3 C) 99.2 F (37.3 C) 98 F (36.7 C) 98.3 F (36.8 C)  TempSrc: Oral Oral Axillary Axillary  SpO2: 100% 100% 100% 100%  Weight: 78.9 kg       Intake/Output Summary (Last 24 hours) at 10/03/2019 1641 Last data filed at 10/03/2019 1628 Gross per 24 hour  Intake 2075.08 ml  Output 1600 ml  Net 475.08 ml   Filed Weights   10/03/19 0500  Weight: 78.9 kg    Examination:  General exam: Appears calm and comfortable  Respiratory system: Respiratory effort normal. Cardiovascular system: S1 & S2 present.  RRR. Gastrointestinal system: Abdomen is nondistended, soft and nontender. Central nervous system: Alert and oriented. No focal neurological deficits. Extremities: left lower extremity non-pitting edema.  Skin: No rashes, lesions or ulcers Psychiatry: Mood & affect appropriate.     Data Reviewed: I have personally reviewed following labs and imaging studies  CBC: Recent Labs  Lab 10/02/19 1440 10/03/19 0403  WBC 8.8 8.1  NEUTROABS 7.3  --   HGB 9.4* 9.1*  HCT 31.1* 28.5*  MCV 104.0* 100.4*  PLT 76* 75*   Basic Metabolic Panel: Recent Labs  Lab 10/02/19 2101 10/03/19 0024 10/03/19 0403 10/03/19 0802 10/03/19 1216  NA 164* 164* 165* 162* 161*  K 4.6 4.6 4.4 4.2 4.4  CL 130* 128* 128* 129* 128*  CO2 19* 20* 20* 21* 21*  GLUCOSE 208* 267* 223* 205* 243*  BUN 140* 137* 135* 138* 136*  CREATININE 4.54* 4.49* 4.34* 4.41* 4.56*  CALCIUM 9.6 9.6 9.6 9.2 9.2   GFR: Estimated Creatinine Clearance: 15.4 mL/min (A) (by C-G formula based on SCr of 4.56 mg/dL (H)). Liver Function Tests: Recent Labs  Lab 10/02/19 1440  AST 30  ALT 43  ALKPHOS 96  BILITOT 1.5*  PROT 10.5*  ALBUMIN 2.6*   No results for input(s): LIPASE, AMYLASE in the last 168 hours. Recent Labs  Lab 10/02/19 2107  AMMONIA 11   Coagulation Profile: Recent Labs  Lab 10/02/19 1440 10/03/19 1216  INR 1.6* 2.0*   Cardiac Enzymes: Recent Labs  Lab 10/02/19 1440  CKTOTAL 470*   BNP (last 3 results) No results for input(s): PROBNP in the last 8760 hours. HbA1C: Recent Labs    10/02/19 2101  HGBA1C 6.3*   CBG: Recent Labs  Lab 10/03/19 0109 10/03/19 0455 10/03/19 0815 10/03/19 1209 10/03/19 1557  GLUCAP 211* 202* 174* 211* 200*   Lipid Profile: No results for input(s): CHOL, HDL, LDLCALC, TRIG, CHOLHDL, LDLDIRECT in the last 72 hours. Thyroid Function Tests: Recent Labs    10/02/19 2107  TSH 0.320*   Anemia Panel: Recent Labs    10/02/19 2107  VITAMINB12 380   Sepsis  Labs: Recent Labs  Lab 10/02/19 2101  PROCALCITON 0.71    Recent Results (from the past 240 hour(s))  Respiratory Panel by RT PCR (Flu A&B, Covid) - Nasopharyngeal Swab     Status: None   Collection Time: 10/02/19  3:03 PM   Specimen: Nasopharyngeal Swab  Result Value Ref Range Status   SARS Coronavirus 2 by RT PCR NEGATIVE NEGATIVE Final    Comment: (NOTE) SARS-CoV-2 target nucleic acids are NOT DETECTED. The SARS-CoV-2 RNA is generally detectable in upper respiratoy specimens during the acute phase of infection. The lowest concentration of SARS-CoV-2 viral copies this assay can detect is 131 copies/mL. A negative result does not preclude SARS-Cov-2 infection and should not be used as the sole basis for treatment or other patient management decisions. A negative result may occur with  improper specimen collection/handling, submission of specimen other than nasopharyngeal swab, presence of viral mutation(s) within the areas targeted by this assay, and inadequate number of viral copies (<131 copies/mL). A negative result must be combined with clinical observations, patient history, and epidemiological information. The expected result is Negative. Fact Sheet for Patients:  PinkCheek.be Fact Sheet for Healthcare Providers:  GravelBags.it This test is not yet ap proved or cleared by the [place] FDA and  has been authorized for detection and/or diagnosis of SARS-CoV-2 by FDA under an Emergency Use Authorization (EUA). This EUA will remain  in effect (meaning this test can be used) for the duration of the COVID-19 declaration under Section 564(b)(1) of the Act, 21 U.S.C. section 360bbb-3(b)(1), unless the authorization is terminated or revoked sooner.    Influenza A by PCR NEGATIVE NEGATIVE Final   Influenza B by PCR NEGATIVE NEGATIVE Final    Comment: (NOTE) The Xpert Xpress SARS-CoV-2/FLU/RSV assay is intended as an  aid in  the diagnosis of influenza from Nasopharyngeal swab specimens and  should not be used as a sole basis for treatment. Nasal washings and  aspirates are unacceptable for Xpert Xpress SARS-CoV-2/FLU/RSV  testing. Fact Sheet for Patients: PinkCheek.be Fact Sheet for Healthcare Providers: GravelBags.it This test is not yet approved or cleared by the [place] FDA and  has been authorized for detection and/or diagnosis of SARS-CoV-2 by  FDA under an Emergency Use Authorization (EUA). This EUA will remain  in effect (meaning this test can be used) for the duration of the  Covid-19 declaration under Section 564(b)(1) of the Act, 21  U.S.C. section 360bbb-3(b)(1), unless the authorization is  terminated or revoked. Performed at [facility], [place] [address] [place], [place] [address]          Radiology Studies: EEG  Result Date: 10/02/2019 Lora Havens, MD     10/02/2019  4:56 PM  Patient Name: Mark Pinn. MRN: [id number] Epilepsy Attending: Lora Havens Referring Physician/Provider: Etta Quill, PA Date: 10/02/2019 Duration: 23.11 mins Patient history: 74yo M with right frontal infarct, found at house altered and covered in urine , feces. Per EMS he had full body tremors concerning for seizure. EEG to evaluate for seizure Level of alertness: lethargic AEDs during EEG study: None Technical aspects: This EEG study was done with scalp electrodes positioned according to the 10-20 International system of electrode placement. Electrical activity was acquired at a sampling rate of _0  and reviewed with a high frequency filter of _1  and a low frequency filter of _2 . EEG data were recorded continuously and digitally stored. DESCRIPTION: EEG showed continuous generalized and lateralized right hemisphere 3-_3  theta-delta slowing. EEG was reactive to tactile stimulation. Hyperventilation and photic stimulation were  not performed. ABNORMALITY - Continuous slow, generalized and lateralized right hemisphere IMPRESSION: This study is suggestive of cortical dysfunction in right hemisphere likely secondary to underlying stroke as well as moderate diffuse encephalopathy, non specific to etiology. No seizures or epileptiform discharges were seen throughout the recording. Lora Havens   DG Clavicle Right  Result Date: 10/02/2019 CLINICAL DATA:  Fall. Pt was nonverbal for imaging. Pt did display sounds of pain when rolling for images of his left hip. Pt did not display any signs of pain or acknowledgement when imaging his right clavicle. EXAM: RIGHT CLAVICLE - 2+ VIEWS COMPARISON:  None. FINDINGS: No fracture or bone lesion. AC joint normally spaced and aligned. Small marginal spurs at the [facility] joint. Glenohumeral joint is normally aligned. Soft tissues are unremarkable. IMPRESSION: 1. No fracture or dislocation. 2. Mild AC joint osteoarthritis. Electronically Signed   By: Lajean Manes M.D.   On: 10/02/2019 16:08   DG Tibia/Fibula Left  Result Date: 10/02/2019 CLINICAL DATA:  Left leg pain. EXAM: LEFT TIBIA AND FIBULA - 2 VIEW COMPARISON:  Left hip evaluation from January 27th 2021 FINDINGS: Osteopenia. Signs of previous surgery about the left knee with a screw spanning the patella. There are wires which are fractured seen throughout the soft tissues about the knee. Soft tissue swelling is noted in the suprapatellar region partially imaged. Marked tricompartmental osteoarthritic changes are present. No signs of acute fracture or discrete bone abnormality aside from findings above. IMPRESSION: 1. No signs of acute fracture. 2. Postoperative changes about the patella with signs of hardware failure of unknown chronicity but associated with soft tissue swelling correlate with acute pain in this area. Comparison with prior imaging could be helpful. 3. Dedicated assessment of the knee may be warranted if there is focal pain in this  area. 4. Marked tricompartmental osteoarthritic change. Electronically Signed   By: Zetta Bills M.D.   On: 10/02/2019 18:10   CT Head Wo Contrast  Result Date: 10/02/2019 CLINICAL DATA:  Altered mental status. EXAM: CT HEAD WITHOUT CONTRAST TECHNIQUE: Contiguous axial images were obtained from the base of the skull through the vertex without intravenous contrast. COMPARISON:  September 11, 2019 FINDINGS: Brain: There is moderate severity cerebral atrophy with widening of the extra-axial spaces and ventricular dilatation. There are areas of decreased attenuation within the white matter tracts of the supratentorial brain, consistent with microvascular disease changes. A small to moderate sized area of cortical encephalomalacia, with adjacent chronic white matter low attenuation, is again seen within the right frontal lobe. Vascular: No hyperdense vessel or unexpected calcification. Skull: Normal. Negative for fracture or focal lesion. Sinuses/Orbits: No acute finding. Other: None. IMPRESSION: 1.  Generalized cerebral atrophy. 2. Chronic right frontal lobe infarct. 3. No acute intracranial abnormality. Electronically Signed   By: Virgina Norfolk M.D.   On: 10/02/2019 16:15   CT Cervical Spine Wo Contrast  Result Date: 10/02/2019 CLINICAL DATA:  Altered mental status with possible fall. EXAM: CT CERVICAL SPINE WITHOUT CONTRAST TECHNIQUE: Multidetector CT imaging of the cervical spine was performed without intravenous contrast. Multiplanar CT image reconstructions were also generated. COMPARISON:  None. FINDINGS: Alignment: Normal. Skull base and vertebrae: No acute fracture. No primary bone lesion or focal pathologic process. Soft tissues and spinal canal: No prevertebral fluid or swelling. No visible canal hematoma. Disc levels: C2-3: There is mild end plate spondylosis. Mild disc space narrowing is seen. Bilateral facet hypertrophy is noted. Normal central canal and intervertebral neuroforamina. C3-4: There  is moderate to marked severity end plate spondylosis. Marked severity disc space narrowing is seen. Bilateral facet hypertrophy is noted. Normal central canal and intervertebral neuroforamina. C4-5: There is moderate to marked severity end plate spondylosis. Marked severity disc space narrowing is seen. Bilateral facet hypertrophy is noted. Normal central canal and intervertebral neuroforamina. C5-6: There is moderate to marked severity end plate spondylosis. Marked severity disc space narrowing is seen. Bilateral facet hypertrophy is noted. Normal central canal and intervertebral neuroforamina. C6-7: There is mild to moderate severity end plate spondylosis. Mild to moderate severity disc space narrowing is seen. Bilateral facet hypertrophy is noted. Normal central canal and intervertebral neuroforamina. C7-T1: There is mild end plate spondylosis. Mild disc space narrowing is seen. Bilateral facet hypertrophy is noted. Normal central canal and intervertebral neuroforamina. Upper chest: Negative. Other: None. IMPRESSION: 1. Marked severity multilevel degenerative changes without evidence of acute osseous abnormality. Electronically Signed   By: Virgina Norfolk M.D.   On: 10/02/2019 16:20   DG Pelvis Portable  Result Date: 10/02/2019 CLINICAL DATA:  Fall with left hip pain. EXAM: PORTABLE PELVIS 1-2 VIEWS COMPARISON:  None. FINDINGS: Left hip in internal rotation. No fracture visible given this projection. Suggest additional imaging if concern persists. IMPRESSION: Internally rotated left hip. No fracture visible. Additional images suggested if concern persists. Electronically Signed   By: Nelson Chimes M.D.   On: 10/02/2019 15:08   DG Chest Portable 1 View  Result Date: 10/02/2019 CLINICAL DATA:  [place].  Altered mental status. EXAM: PORTABLE CHEST 1 VIEW COMPARISON:  06/28/2018 FINDINGS: Cardiomegaly. Aortic atherosclerosis. Right lung is clear. Minimal atelectasis or infiltrate at the left base. No evidence  of heart failure or effusion. No regional traumatic bone finding. IMPRESSION: Mild left base atelectasis or infiltrate.  Otherwise negative. Electronically Signed   By: Nelson Chimes M.D.   On: 10/02/2019 15:09   DG Hip Unilat W or Wo Pelvis 2-3 Views Left  Result Date: 10/02/2019 CLINICAL DATA:  Fall. Pt was nonverbal for imaging. Pt did display sounds of pain when rolling for images of his left hip. Pt did not display any signs of pain or acknowledgement when imaging his right clavicle. EXAM: DG HIP (WITH OR WITHOUT PELVIS) 2-3V LEFT COMPARISON:  None. FINDINGS: No fracture or bone lesion. Left hip joint normally spaced and aligned. No arthropathic changes. Soft tissues are unremarkable. IMPRESSION: Negative. Electronically Signed   By: Lajean Manes M.D.   On: 10/02/2019 16:07   DG Femur Min 2 Views Left  Result Date: 10/02/2019 CLINICAL DATA:  Left leg pain. EXAM: LEFT FEMUR 2 VIEWS COMPARISON:  None. FINDINGS: There is no evidence of fracture or other focal bone lesions. A radiopaque surgical  screw and thin curvilinear surgical sutures are seen within the left patella. Surgical sutures are also seen within the soft tissues superior to the left patella. There is a small joint effusion involving the left knee. IMPRESSION: 1. Postoperative changes involving the left knee, as described above. 2. No acute osseous abnormality. 3. Small left knee joint effusion. Electronically Signed   By: Virgina Norfolk M.D.   On: 10/02/2019 18:07   VAS [place] LOWER EXTREMITY VENOUS (DVT)  Result Date: 10/03/2019  Lower Venous Study Indications: History of DVT, Swelling, and Pain.  Anticoagulation: Coumadin. Comparison Study: No prior studies on file. Performing Technologist: Oda Cogan RDMS, RVT  Examination Guidelines: A complete evaluation includes B-mode imaging, spectral Doppler, color Doppler, and power Doppler as needed of all accessible portions of each vessel. Bilateral testing is considered an integral part  of a complete examination. Limited examinations for reoccurring indications may be performed as noted.  +-----+---------------+---------+-----------+----------+--------------+ RIGHTCompressibilityPhasicitySpontaneityPropertiesThrombus Aging +-----+---------------+---------+-----------+----------+--------------+ CFV  Full           Yes                                          +-----+---------------+---------+-----------+----------+--------------+ SFJ  Full                                                        +-----+---------------+---------+-----------+----------+--------------+   +---------+---------------+---------+-----------+----------+--------------+ LEFT     CompressibilityPhasicitySpontaneityPropertiesThrombus Aging +---------+---------------+---------+-----------+----------+--------------+ CFV      Full           Yes      Yes                                 +---------+---------------+---------+-----------+----------+--------------+ SFJ      Full                                                        +---------+---------------+---------+-----------+----------+--------------+ FV Prox  Partial                                      Chronic        +---------+---------------+---------+-----------+----------+--------------+ FV Mid   Partial        Yes      Yes                  Chronic        +---------+---------------+---------+-----------+----------+--------------+ FV DistalPartial                                      Chronic        +---------+---------------+---------+-----------+----------+--------------+ PFV      Full                                                        +---------+---------------+---------+-----------+----------+--------------+  POP      Full           Yes      Yes                                 +---------+---------------+---------+-----------+----------+--------------+ PTV      Full                                                         +---------+---------------+---------+-----------+----------+--------------+ PERO     Partial                                      Chronic        +---------+---------------+---------+-----------+----------+--------------+     Summary: Right: No evidence of common femoral vein obstruction. Left: Findings consistent with chronic deep vein thrombosis involving the left femoral vein, and left peroneal veins. No cystic structure found in the popliteal fossa.  *See table(s) above for measurements and observations. Electronically signed by Monica Martinez MD on 10/03/2019 at 4:22:11 PM.    Final         Scheduled Meds: . insulin aspart  0-6 Units Subcutaneous Q4H   Continuous Infusions: . dextrose 150 mL/hr at 10/03/19 1628  . heparin 1,100 Units/hr (10/03/19 1500)  . [START ON 10/05/2019] levofloxacin (LEVAQUIN) IV       LOS: 1 day    Time spent: 35 minutes    Blain Pais, MD [facility]   If 7PM-7AM, please contact night-coverage [email or web address] Password [facility] 10/03/2019, 4:41 PM

## 2019-10-03 NOTE — Progress Notes (Signed)
[place] for Warfarin Indication: VTE treatment   Allergies  Allergen Reactions  . Penicillins Swelling    Has patient had a PCN reaction causing immediate rash, facial/tongue/throat swelling, SOB or lightheadedness with hypotension: Yes Has patient had a PCN reaction causing severe rash involving mucus membranes or skin necrosis: No Has patient had a PCN reaction that required hospitalization:No Has patient had a PCN reaction occurring within the last 10 years: No If all of the above answers are "NO", then may proceed with Cephalosporin use.   . Molds & Smuts Other (See Comments)    Due to allergy testing.    Patient Measurements: Weight: 173 lb 15.1 oz (78.9 kg) Heparin Dosing Weight:   Vital Signs: Temp: 98.1 F (36.7 C) (01/28 1925) Temp Source: Oral (01/28 1925) BP: 119/65 (01/28 1925) Pulse Rate: 50 (01/28 1537)  Labs: Recent Labs    10/02/19 1440 10/02/19 2101 10/03/19 0403 10/03/19 0802 10/03/19 1216 10/03/19 1900  HGB 9.4*  --  9.1*  --   --   --   HCT 31.1*  --  28.5*  --   --   --   PLT 76*  --  75*  --   --   --   LABPROT 19.2*  --   --   --  22.8*  --   INR 1.6*  --   --   --  2.0*  --   HEPARINUNFRC  --   --   --   --   --  0.21*  CREATININE 4.67*   < > 4.34* 4.41* 4.56*  --   CKTOTAL 470*  --   --   --   --   --    < > = values in this interval not displayed.    Estimated Creatinine Clearance: 15.4 mL/min (A) (by C-G formula based on SCr of 4.56 mg/dL (H)).   Medical History: Past Medical History:  Diagnosis Date  . Chronic kidney disease (CKD)   . DVT (deep venous thrombosis) ([place])   . Multiple myeloma ([place])   . Solitary kidney     Medications:  Scheduled:  . chlorhexidine  15 mL Mouth Rinse BID  . insulin aspart  0-6 Units Subcutaneous Q4H  . [START ON 10/04/2019] mouth rinse  15 mL Mouth Rinse q12n4p    Assessment: Patient is a 89 yom that was found on the floor with decreased responsiveness.  The patient's last known normal time was a couple of days ago. It is difficult to determine last doses of warfarin and schedule. Pharmacy has been asked to continue this patients home warfarin. IV heparin was started earlier today. Plans for possible surgery.  -INR= 2.0, heparin level= 0.21  PTA regimen appears to be Warfarin 10 mg Daily.     Goal of Therapy:  INR 2-3 Monitor platelets by anticoagulation protocol: Yes   Plan:  Increase heparin to 1300 units/hr Daily heparin level and CBC  Hildred Laser, PharmD Clinical Pharmacist **Pharmacist phone directory can now be found on [email or web address] ([email or web address]).  Listed under [place].

## 2019-10-04 LAB — BASIC METABOLIC PANEL
Anion gap: 11 (ref 5–15)
Anion gap: 13 (ref 5–15)
Anion gap: 14 (ref 5–15)
Anion gap: 15 (ref 5–15)
Anion gap: 16 — ABNORMAL HIGH (ref 5–15)
BUN: 127 mg/dL — ABNORMAL HIGH (ref 8–23)
BUN: 129 mg/dL — ABNORMAL HIGH (ref 8–23)
BUN: 130 mg/dL — ABNORMAL HIGH (ref 8–23)
BUN: 131 mg/dL — ABNORMAL HIGH (ref 8–23)
BUN: 136 mg/dL — ABNORMAL HIGH (ref 8–23)
CO2: 16 mmol/L — ABNORMAL LOW (ref 22–32)
CO2: 18 mmol/L — ABNORMAL LOW (ref 22–32)
CO2: 19 mmol/L — ABNORMAL LOW (ref 22–32)
CO2: 19 mmol/L — ABNORMAL LOW (ref 22–32)
CO2: 20 mmol/L — ABNORMAL LOW (ref 22–32)
Calcium: 8.9 mg/dL (ref 8.9–10.3)
Calcium: 9.1 mg/dL (ref 8.9–10.3)
Calcium: 9.2 mg/dL (ref 8.9–10.3)
Calcium: 9.3 mg/dL (ref 8.9–10.3)
Calcium: 9.3 mg/dL (ref 8.9–10.3)
Chloride: 124 mmol/L — ABNORMAL HIGH (ref 98–111)
Chloride: 124 mmol/L — ABNORMAL HIGH (ref 98–111)
Chloride: 126 mmol/L — ABNORMAL HIGH (ref 98–111)
Chloride: 126 mmol/L — ABNORMAL HIGH (ref 98–111)
Chloride: 127 mmol/L — ABNORMAL HIGH (ref 98–111)
Creatinine, Ser: 4.3 mg/dL — ABNORMAL HIGH (ref 0.61–1.24)
Creatinine, Ser: 4.44 mg/dL — ABNORMAL HIGH (ref 0.61–1.24)
Creatinine, Ser: 4.45 mg/dL — ABNORMAL HIGH (ref 0.61–1.24)
Creatinine, Ser: 4.68 mg/dL — ABNORMAL HIGH (ref 0.61–1.24)
Creatinine, Ser: 4.69 mg/dL — ABNORMAL HIGH (ref 0.61–1.24)
GFR calc Af Amer: 13 mL/min — ABNORMAL LOW (ref >60)
GFR calc Af Amer: 13 mL/min — ABNORMAL LOW (ref >60)
GFR calc Af Amer: 14 mL/min — ABNORMAL LOW (ref >60)
GFR calc Af Amer: 14 mL/min — ABNORMAL LOW (ref >60)
GFR calc Af Amer: 15 mL/min — ABNORMAL LOW (ref >60)
GFR calc non Af Amer: 11 mL/min — ABNORMAL LOW (ref >60)
GFR calc non Af Amer: 11 mL/min — ABNORMAL LOW (ref >60)
GFR calc non Af Amer: 12 mL/min — ABNORMAL LOW (ref >60)
GFR calc non Af Amer: 12 mL/min — ABNORMAL LOW (ref >60)
GFR calc non Af Amer: 13 mL/min — ABNORMAL LOW (ref >60)
Glucose, Bld: 199 mg/dL — ABNORMAL HIGH (ref 70–99)
Glucose, Bld: 203 mg/dL — ABNORMAL HIGH (ref 70–99)
Glucose, Bld: 232 mg/dL — ABNORMAL HIGH (ref 70–99)
Glucose, Bld: 243 mg/dL — ABNORMAL HIGH (ref 70–99)
Glucose, Bld: 253 mg/dL — ABNORMAL HIGH (ref 70–99)
Potassium: 3.8 mmol/L (ref 3.5–5.1)
Potassium: 4.1 mmol/L (ref 3.5–5.1)
Potassium: 4.2 mmol/L (ref 3.5–5.1)
Potassium: 4.4 mmol/L (ref 3.5–5.1)
Potassium: 5.1 mmol/L (ref 3.5–5.1)
Sodium: 154 mmol/L — ABNORMAL HIGH (ref 135–145)
Sodium: 155 mmol/L — ABNORMAL HIGH (ref 135–145)
Sodium: 159 mmol/L — ABNORMAL HIGH (ref 135–145)
Sodium: 160 mmol/L — ABNORMAL HIGH (ref 135–145)
Sodium: 160 mmol/L — ABNORMAL HIGH (ref 135–145)

## 2019-10-04 LAB — GLUCOSE, CAPILLARY
Glucose-Capillary: 169 mg/dL — ABNORMAL HIGH (ref 70–99)
Glucose-Capillary: 172 mg/dL — ABNORMAL HIGH (ref 70–99)
Glucose-Capillary: 209 mg/dL — ABNORMAL HIGH (ref 70–99)
Glucose-Capillary: 219 mg/dL — ABNORMAL HIGH (ref 70–99)
Glucose-Capillary: 223 mg/dL — ABNORMAL HIGH (ref 70–99)
Glucose-Capillary: 224 mg/dL — ABNORMAL HIGH (ref 70–99)

## 2019-10-04 LAB — CBC
HCT: 30.4 % — ABNORMAL LOW (ref 39.0–52.0)
Hemoglobin: 9.2 g/dL — ABNORMAL LOW (ref 13.0–17.0)
MCH: 31.4 pg (ref 26.0–34.0)
MCHC: 30.3 g/dL (ref 30.0–36.0)
MCV: 103.8 fL — ABNORMAL HIGH (ref 80.0–100.0)
Platelets: 73 10*3/uL — ABNORMAL LOW (ref 150–400)
RBC: 2.93 MIL/uL — ABNORMAL LOW (ref 4.22–5.81)
RDW: 14.8 % (ref 11.5–15.5)
WBC: 8.2 10*3/uL (ref 4.0–10.5)
nRBC: 0 % (ref 0.0–0.2)

## 2019-10-04 LAB — HEPARIN LEVEL (UNFRACTIONATED)
Heparin Unfractionated: 0.37 IU/mL (ref 0.30–0.70)
Heparin Unfractionated: 0.28 IU/mL — ABNORMAL LOW (ref 0.30–0.70)

## 2019-10-04 LAB — PROTIME-INR
INR: 1.8 — ABNORMAL HIGH (ref 0.8–1.2)
Prothrombin Time: 20.8 seconds — ABNORMAL HIGH (ref 11.4–15.2)

## 2019-10-04 LAB — CK: Total CK: 222 U/L (ref 49–397)

## 2019-10-04 MED ORDER — SODIUM BICARBONATE 8.4 % IV SOLN
50.0000 meq | Freq: Once | INTRAVENOUS | Status: AC
  Administered 2019-10-04: 50 meq via INTRAVENOUS
  Filled 2019-10-04: qty 50

## 2019-10-04 MED ORDER — ACETAMINOPHEN 325 MG PO TABS: 325.0000 mg | ORAL_TABLET | Freq: Once | ORAL | Status: DC

## 2019-10-04 NOTE — Progress Notes (Signed)
[place] for Heparin  Indication: VTE treatment   Allergies  Allergen Reactions  . Penicillins Swelling    Has patient had a PCN reaction causing immediate rash, facial/tongue/throat swelling, SOB or lightheadedness with hypotension: Yes Has patient had a PCN reaction causing severe rash involving mucus membranes or skin necrosis: No Has patient had a PCN reaction that required hospitalization:No Has patient had a PCN reaction occurring within the last 10 years: No If all of the above answers are "NO", then may proceed with Cephalosporin use.   . Molds & Smuts Other (See Comments)    Due to allergy testing.    Patient Measurements: Weight: 173 lb 1 oz (78.5 kg) Heparin Dosing Weight:   Vital Signs: Temp: 98.7 F (37.1 C) (01/29 1130) Temp Source: Axillary (01/29 1130) BP: 103/68 (01/29 1130) Pulse Rate: 103 (01/29 0757)  Labs: Recent Labs    10/02/19 1440 10/02/19 2101 10/03/19 0403 10/03/19 0802 10/03/19 1216 10/03/19 1216 10/03/19 1900 10/04/19 0044 10/04/19 0327 10/04/19 1125  HGB 9.4*  --  9.1*  --   --   --   --   --  9.2*  --   HCT 31.1*  --  28.5*  --   --   --   --   --  30.4*  --   PLT 76*  --  75*  --   --   --   --   --  73*  --   LABPROT 19.2*  --   --   --  22.8*  --   --   --  20.8*  --   INR 1.6*  --   --   --  2.0*  --   --   --  1.8*  --   HEPARINUNFRC  --   --   --   --   --   --  0.21*  --  0.37 0.28*  CREATININE 4.67*   < > 4.34*   < > 4.56*   < >  --  4.44* 4.45* 4.30*  CKTOTAL 470*  --   --   --   --   --   --   --   --  222   < > = values in this interval not displayed.    Estimated Creatinine Clearance: 16.1 mL/min (A) (by C-G formula based on SCr of 4.3 mg/dL (H)).   Medical History: Past Medical History:  Diagnosis Date  . Chronic kidney disease (CKD)   . DVT (deep venous thrombosis) ([place])   . Multiple myeloma ([place])   . Solitary kidney     Medications:  Scheduled:  . chlorhexidine  15  mL Mouth Rinse BID  . insulin aspart  0-6 Units Subcutaneous Q4H  . mouth rinse  15 mL Mouth Rinse q12n4p    Assessment: Patient is a 87 yom that was found on the floor with decreased responsiveness. The patient's last known normal time was a couple of days ago. It is difficult to determine last doses of warfarin and schedule. Pharmacy has been asked to continue this patients home warfarin. IV heparin was started 1/28. Plans for possible surgery.   PTA regimen appears to be Warfarin 10 mg Daily.  Heparin level this afternoon has dipped below goal at 0.28, INR down to 1.8 this am. No bleeding issues noted.   Goal of Therapy:  Heparin level 0.3-0.7 units/mL Monitor platelets by anticoagulation protocol: Yes   Plan:  Increase heparin  to 1400 units/hr Recheck with am labs  Erin Hearing PharmD., BCPS Clinical Pharmacist 10/04/2019 1:35 PM

## 2019-10-04 NOTE — Telephone Encounter (Signed)
Patient cancelled appointment on 09/26/19 and informed scheduler he would call back to reschedule. No appointment made by 1/29 -- accessed chart to follow up - patient admitted to hospital on 1/27.

## 2019-10-04 NOTE — Progress Notes (Signed)
[place] for Heparin  Indication: VTE treatment   Allergies  Allergen Reactions  . Penicillins Swelling    Has patient had a PCN reaction causing immediate rash, facial/tongue/throat swelling, SOB or lightheadedness with hypotension: Yes Has patient had a PCN reaction causing severe rash involving mucus membranes or skin necrosis: No Has patient had a PCN reaction that required hospitalization:No Has patient had a PCN reaction occurring within the last 10 years: No If all of the above answers are "NO", then may proceed with Cephalosporin use.   . Molds & Smuts Other (See Comments)    Due to allergy testing.    Patient Measurements: Weight: 173 lb 1 oz (78.5 kg) Heparin Dosing Weight:   Vital Signs: Temp: 97.8 F (36.6 C) (01/29 0340) Temp Source: Oral (01/29 0340) BP: 134/60 (01/29 0340) Pulse Rate: 101 (01/29 0340)  Labs: Recent Labs    10/02/19 1440 10/02/19 2101 10/03/19 0403 10/03/19 0802 10/03/19 1216 10/03/19 1900 10/04/19 0044 10/04/19 0327  HGB 9.4*  --  9.1*  --   --   --   --  9.2*  HCT 31.1*  --  28.5*  --   --   --   --  30.4*  PLT 76*  --  75*  --   --   --   --  73*  LABPROT 19.2*  --   --   --  22.8*  --   --  20.8*  INR 1.6*  --   --   --  2.0*  --   --  1.8*  HEPARINUNFRC  --   --   --   --   --  0.21*  --  0.37  CREATININE 4.67*   < > 4.34*   < > 4.56*  --  4.44* 4.45*  CKTOTAL 470*  --   --   --   --   --   --   --    < > = values in this interval not displayed.    Estimated Creatinine Clearance: 15.5 mL/min (A) (by C-G formula based on SCr of 4.45 mg/dL (H)).   Medical History: Past Medical History:  Diagnosis Date  . Chronic kidney disease (CKD)   . DVT (deep venous thrombosis) ([place])   . Multiple myeloma ([place])   . Solitary kidney     Medications:  Scheduled:  . chlorhexidine  15 mL Mouth Rinse BID  . insulin aspart  0-6 Units Subcutaneous Q4H  . mouth rinse  15 mL Mouth Rinse q12n4p     Assessment: Patient is a 71 yom that was found on the floor with decreased responsiveness. The patient's last known normal time was a couple of days ago. It is difficult to determine last doses of warfarin and schedule. Pharmacy has been asked to continue this patients home warfarin. IV heparin was started earlier today. Plans for possible surgery.  -INR= 2.0, heparin level= 0.21  PTA regimen appears to be Warfarin 10 mg Daily.  1/29 AM update:  Heparin level therapeutic   Goal of Therapy:  Heparin level 0.3-0.7 units/mL Monitor platelets by anticoagulation protocol: Yes   Plan:  Cont heparin at 1300 units/hr Confirmatory heparin level at [place], PharmD, [place] Pharmacist Phone: [phone]

## 2019-10-04 NOTE — Evaluation (Signed)
Clinical/Bedside Swallow Evaluation Patient Details  Name: Mark Baird. MRN: [id number] Date of Birth: 02-25-1946  Today's Date: 10/04/2019 Time: SLP Start Time (ACUTE ONLY): 1505 SLP Stop Time (ACUTE ONLY): 1545 SLP Time Calculation (min) (ACUTE ONLY): 40 min  Past Medical History:  Past Medical History:  Diagnosis Date  . Chronic kidney disease (CKD)   . DVT (deep venous thrombosis) ([place])   . Multiple myeloma ([place])   . Solitary kidney    Past Surgical History:  Past Surgical History:  Procedure Laterality Date  . RIGHT/LEFT HEART CATH AND CORONARY ANGIOGRAPHY N/A 09/11/2017   Procedure: RIGHT/LEFT HEART CATH AND CORONARY ANGIOGRAPHY;  Surgeon: Nigel Mormon, MD;  Location: [facility];  Service: Cardiovascular;  Laterality: N/A;   HPI:  74 yo male admitted 10/02/19 with AMS and severe dehydration.Marland Kitchen PMH: solitary kidney, CKD3, DVT, multiple myeloma, encephalomalacia, R MCA infarct, nonischemic cardiomyopathy, HTN. CXR = minimal atelectasis or infiltrate at the left lung base, HeadCT no acute abnormality   Assessment / Plan / Recommendation Clinical Impression  Pt seen at bedside for assessment of swallow function and safety. Pt was sleeping upon arrival of SLP, but awakened easily with minimal stim. Oral care was completed with suction. Pt with poor, missing dentition. Speech intelligible, but at low intensity. Pt able to accept trials of puree and thin liquid, both of which were tolerated without overt s/s aspiration. Pt passed 3oz water challenge without difficulty. Solid textures not given, due to generalized weakness and poor dentition. Recommend puree diet with thin liquids, to be given only when pt is [place]. Meds crushed in puree. Safe swallow precautions posted at [facility]. SLP will follow for diet tolerance assessment and education. RN and MD informed.   SLP Visit Diagnosis: Dysphagia, unspecified (R13.10)    Aspiration Risk  Moderate aspiration risk     Diet Recommendation Dysphagia 1 (Puree);Thin liquid   Liquid Administration via: Straw Medication Administration: Crushed with puree Supervision: Staff to assist with self feeding;Full supervision/cueing for compensatory strategies Compensations: Slow rate;Small sips/bites Postural Changes: Seated upright at 90 degrees    Other  Recommendations Oral Care Recommendations: Oral care BID   Follow up Recommendations 24 hour supervision/assistance      Frequency and Duration min 2x/week  1 week;2 weeks       Prognosis Prognosis for Safe Diet Advancement: Fair      Swallow Study   General Date of Onset: 10/04/19 HPI: 74 yo male admitted 10/02/19 with AMS and severe dehydration.Marland Kitchen PMH: solitary kidney, CKD3, DVT, multiple myeloma, encephalomalacia, R MCA infarct, nonischemic cardiomyopathy, HTN. CXR = minimal atelectasis or infiltrate at the left lung base, HeadCT no acute abnormality Type of Study: Bedside Swallow Evaluation Previous Swallow Assessment: none found Diet Prior to this Study: NPO Temperature Spikes Noted: No Respiratory Status: Room air History of Recent Intubation: No Behavior/Cognition: Cooperative;Pleasant mood;Alert Oral Cavity Assessment: Within Functional Limits Oral Care Completed by SLP: Yes Oral Cavity - Dentition: Poor condition;Missing dentition Self-Feeding Abilities: Total assist Patient Positioning: Upright in bed Baseline Vocal Quality: Low vocal intensity Volitional Cough: Cognitively unable to elicit Volitional Swallow: Unable to elicit    Oral/Motor/Sensory Function Overall Oral Motor/Sensory Function: Generalized oral weakness   Ice Chips Ice chips: Within functional limits Presentation: Spoon   Thin Liquid Thin Liquid: Within functional limits Presentation: Straw;Cup    Nectar Thick Nectar Thick Liquid: Not tested   Honey Thick Honey Thick Liquid: Not tested   Puree Puree: Within functional limits Presentation:  Spoon   Solid      Solid: Not tested     Dajanae Brophy B. Quentin Ore, [facility], [place] Speech Language Pathologist Office: [phone] Pager: [phone]  Shonna Chock 10/04/2019,3:59 PM

## 2019-10-04 NOTE — Progress Notes (Signed)
PROGRESS NOTE    Mark Baird.  KYH:[id number] DOB: 12-04-1945 DOA: 10/02/2019 PCP: Sandi Mariscal, MD   Brief Narrative:  Mark Baird. is a 74 y.o. 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  Patient was found laying on the couch by his neighbor who called 911.  He was covered in urine and feces.  Last known normal a couple of days ago. EMS reported full body tremors in route and patient was given Versed.  No history could be obtained from the patient due to his altered mental status. In the ED he was afebrile, had tachycardia and tachypnea with normal BP. Had CT head, C-spine, pelvic Xray, left hip Xray, left femur X-ray, right clavicle X-ray that were all negative for acute process.  X-ray of left tibia and fibula showed sings of hardware failure around the patella.  His Scr was elevated to 4.6 from baseline of 2. Sodium level was elevated at 164.  COVID-19 and influenza negative.   Assessment & Plan:   Principal Problem:   Hypernatremia Active Problems:   Chronic renal insufficiency   AKI (acute kidney injury) ([place])   Toxic metabolic encephalopathy   Thrombocytopenia ([facility])   Left knee pain  Severe hypernatremia Suspect related to severe dehydration.  Corrected sodium 164.  Patient was found down laying on his couch by his neighbor covered in urine and feces.  Unclear how long he has been in this condition.  Remains dehydrated. Sodium level gradually trending down.  -Continue D5W @150  cc/hr -Monitor sodium level every 4 hours and adjust rate of fluids accordingly.  Goal rate of correction 10-12 meq in 24 hours. Sodium level is gradually improving.   AKI on CKD stage 3 Suspect prerenal due to severe dehydration. BUN 136, creatinine 4.6.  Baseline  creatinine 1.6-2.0. -Continue IV fluid hydration -Monitor renal function and urine output, start bladder scan -Avoid nephrotoxic agents/contrast  Toxic metabolic encephalopathy Patient was seen by neurology and his presentation is thought to be due to toxic metabolic encephalopathy likely secondary to hyponatremia and AKI on CKD.  EEG was done in the ED and did not show evidence of seizure or status epilepticus.  Head CT negative for acute intracranial abnormality.  His presentation is not thought to be due to a CNS infection and neurology not recommending LP at this time. TSH mildly suppressed. Ammonia level wnl.  -Continue management of hypernatremia and AKI as mentioned above -Frequent neurochecks -Mentation is gradually improving. Patient seen by SLP, diet advanced to dysphagia diet.    Left lower extremity pain Patient currently appears comfortable, however, upon assessment done by ED provider, it was felt that he was experiencing pain in his left hip/lower extremity.  Pelvic x-ray without evidence of fracture. X-ray of left hip/pelvis negative for fracture. X-ray of left femur showing small left knee joint effusion and no acute osseous abnormality. X-ray of left tibia/fibula negative for fracture.  Showing postop changes about the patella with signs of hardware failure of unknown chronicity associated with soft tissue swelling.  Marked tricompartmental osteoarthritic change. Orthopedic Surgery has evaluated with plan for eventually surgical intervention.    Chronic combined systolic and diastolic congestive heart failure Echo done May 2020 with LVEF 20% and grade 1 diastolic dysfunction.  Suspect left lower extremity edema is related to history of DVT.  Otherwise, does not appear clinically volume overloaded on exam.  Chest x-ray without evidence of volume overload.  Appears very dehydrated. -Hold diuretic at this time -Monitor volume status. Patient appears euvolemic.   History of LLE  DVT in May 2020 Patient is on chronic anticoagulation with Coumadin but INR currently subtherapeutic at 1.6 on presentation.  -Hold coumadin due to anticipated surgical intervention.  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  No signs of active bleeding. -Continue to monitor  Normal anion gap metabolic acidosis Bicarb 19, anion gap 14.  Suspect related to AKI.   -Continue IV fluid hydration -Continue to monitor -Ordered bicarb amp x1, may need more  Hyperglycemia Blood glucose 218.  No documented history of diabetes. Hgb A1c of 6.3%. -Continue  Sliding scale insulin very sensitive every 4 hours as patient is on D5W for hypernatremia.  Pneumonia, poa with elevated pct and hypoxia.  Chest x-ray showing minimal atelectasis or infiltrate at the left lung base.  Continue renally dosed Levaquin.   DVT prophylaxis: heparin drip Code Status: Full code Family Communication: No family available at this time. Patient reports that he lives alone, not sure of who I could call to update but he will think about it.  Disposition Plan: Anticipate discharge after clinical improvement. Consults called: Neurology, orthopedics Procedures:  None Antimicrobials:  Levaquin 1/27>> Subjective: Patient feels tired. Denies pain. Slow to respond but following commands.   Objective: Vitals:   10/04/19 0340 10/04/19 0757 10/04/19 1130 10/04/19 1538  BP: 134/60 132/68 103/68 120/69  Pulse: (!) 101 (!) 103  (!) 104  Resp: 18 (!) 26  (!) 26  Temp: 97.8 F (36.6 C) 97.7 F (36.5 C) 98.7 F (37.1 C) 98.7 F (37.1 C)  TempSrc: Oral Oral Axillary Axillary  SpO2: 99% 98%  95%  Weight: 78.5 kg       Intake/Output Summary (Last 24 hours) at 10/04/2019 1822 Last data filed at 10/04/2019 1619 Gross per 24 hour  Intake 1016.23 ml  Output 1550 ml  Net -533.77 ml   Filed Weights    10/03/19 0500 10/04/19 0340  Weight: 78.9 kg 78.5 kg    Examination:  General exam: Appears calm and comfortable  Respiratory system: Respiratory effort normal. Cardiovascular system: S1 & S2 present. RRR. Gastrointestinal system: Abdomen is nondistended, soft and nontender. Central nervous system: Alert and oriented. No focal neurological deficits. Extremities: left lower extremity non-pitting edema.  Skin: No rashes, lesions or ulcers Psychiatry: Mood & affect appropriate.     Data Reviewed: I have personally reviewed following labs and imaging studies  CBC: Recent Labs  Lab 10/02/19 1440 10/03/19 0403 10/04/19 0327  WBC 8.8 8.1 8.2  NEUTROABS 7.3  --   --   HGB 9.4* 9.1* 9.2*  HCT 31.1* 28.5* 30.4*  MCV 104.0* 100.4* 103.8*  PLT 76* 75* 73*   Basic Metabolic Panel: Recent Labs  Lab 10/03/19 1216 10/04/19 0044 10/04/19 0327 10/04/19 1125 10/04/19 1607  NA 161* 159* 160* 160* 155*  K 4.4 5.1 4.2 4.4 4.1  CL 128* 127* 126* 126* 124*  CO2 21* 16* 19* 20* 18*  GLUCOSE 243* 199* 203* 232* 253*  BUN 136* 136* 131* 129* 127*  CREATININE 4.56* 4.44* 4.45* 4.30* 4.68*  CALCIUM 9.2 9.3 9.3 9.2 9.1   GFR: Estimated Creatinine Clearance: 14.7 mL/min (A) (by C-G formula based on SCr of 4.68 mg/dL (H)).  Liver Function Tests: Recent Labs  Lab 10/02/19 1440  AST 30  ALT 43  ALKPHOS 96  BILITOT 1.5*  PROT 10.5*  ALBUMIN 2.6*   No results for input(s): LIPASE, AMYLASE in the last 168 hours. Recent Labs  Lab 10/02/19 2107  AMMONIA 11   Coagulation Profile: Recent Labs  Lab 10/02/19 1440 10/03/19 1216 10/04/19 0327  INR 1.6* 2.0* 1.8*   Cardiac Enzymes: Recent Labs  Lab 10/02/19 1440 10/04/19 1125  CKTOTAL 470* 222   BNP (last 3 results) No results for input(s): PROBNP in the last 8760 hours. HbA1C: Recent Labs    10/02/19 2101  HGBA1C 6.3*   CBG: Recent Labs  Lab 10/04/19 0012 10/04/19 0352 10/04/19 0808 10/04/19 1136 10/04/19 1616    GLUCAP 209* 169* 172* 223* 224*   Lipid Profile: No results for input(s): CHOL, HDL, LDLCALC, TRIG, CHOLHDL, LDLDIRECT in the last 72 hours. Thyroid Function Tests: Recent Labs    10/02/19 2107  TSH 0.320*   Anemia Panel: Recent Labs    10/02/19 2107  VITAMINB12 380   Sepsis Labs: Recent Labs  Lab 10/02/19 2101  PROCALCITON 0.71    Recent Results (from the past 240 hour(s))  Respiratory Panel by RT PCR (Flu A&B, Covid) - Nasopharyngeal Swab     Status: None   Collection Time: 10/02/19  3:03 PM   Specimen: Nasopharyngeal Swab  Result Value Ref Range Status   SARS Coronavirus 2 by RT PCR NEGATIVE NEGATIVE Final    Comment: (NOTE) SARS-CoV-2 target nucleic acids are NOT DETECTED. The SARS-CoV-2 RNA is generally detectable in upper respiratoy specimens during the acute phase of infection. The lowest concentration of SARS-CoV-2 viral copies this assay can detect is 131 copies/mL. A negative result does not preclude SARS-Cov-2 infection and should not be used as the sole basis for treatment or other patient management decisions. A negative result may occur with  improper specimen collection/handling, submission of specimen other than nasopharyngeal swab, presence of viral mutation(s) within the areas targeted by this assay, and inadequate number of viral copies (<131 copies/mL). A negative result must be combined with clinical observations, patient history, and epidemiological information. The expected result is Negative. Fact Sheet for Patients:  PinkCheek.be Fact Sheet for Healthcare Providers:  GravelBags.it This test is not yet ap proved or cleared by the [place] FDA and  has been authorized for detection and/or diagnosis of SARS-CoV-2 by FDA under an Emergency Use Authorization (EUA). This EUA will remain  in effect (meaning this test can be used) for the duration of the COVID-19 declaration under  Section 564(b)(1) of the Act, 21 U.S.C. section 360bbb-3(b)(1), unless the authorization is terminated or revoked sooner.    Influenza A by PCR NEGATIVE NEGATIVE Final   Influenza B by PCR NEGATIVE NEGATIVE Final    Comment: (NOTE) The Xpert Xpress SARS-CoV-2/FLU/RSV assay is intended as an aid in  the diagnosis of influenza from Nasopharyngeal swab specimens and  should not be used as a sole basis for treatment. Nasal washings and  aspirates are unacceptable for Xpert Xpress SARS-CoV-2/FLU/RSV  testing. Fact Sheet for Patients: PinkCheek.be Fact Sheet for Healthcare Providers: GravelBags.it This test is not yet approved or cleared by the [place] FDA and  has been authorized for detection and/or diagnosis of SARS-CoV-2 by  FDA under an Emergency Use Authorization (EUA). This EUA will remain  in effect (meaning this test can be used) for the duration of the  Covid-19 declaration under Section 564(b)(1) of the Act,  21  U.S.C. section 360bbb-3(b)(1), unless the authorization is  terminated or revoked. Performed at [facility], [place] [address], [place], [place] [address]          Radiology Studies: VAS [place] LOWER EXTREMITY VENOUS (DVT)  Result Date: 10/03/2019  Lower Venous Study Indications: History of DVT, Swelling, and Pain.  Anticoagulation: Coumadin. Comparison Study: No prior studies on file. Performing Technologist: Oda Cogan RDMS, RVT  Examination Guidelines: A complete evaluation includes B-mode imaging, spectral Doppler, color Doppler, and power Doppler as needed of all accessible portions of each vessel. Bilateral testing is considered an integral part of a complete examination. Limited examinations for reoccurring indications may be performed as noted.  +-----+---------------+---------+-----------+----------+--------------+ RIGHTCompressibilityPhasicitySpontaneityPropertiesThrombus Aging  +-----+---------------+---------+-----------+----------+--------------+ CFV  Full           Yes                                          +-----+---------------+---------+-----------+----------+--------------+ SFJ  Full                                                        +-----+---------------+---------+-----------+----------+--------------+   +---------+---------------+---------+-----------+----------+--------------+ LEFT     CompressibilityPhasicitySpontaneityPropertiesThrombus Aging +---------+---------------+---------+-----------+----------+--------------+ CFV      Full           Yes      Yes                                 +---------+---------------+---------+-----------+----------+--------------+ SFJ      Full                                                        +---------+---------------+---------+-----------+----------+--------------+ FV Prox  Partial                                      Chronic        +---------+---------------+---------+-----------+----------+--------------+ FV Mid   Partial        Yes      Yes                  Chronic        +---------+---------------+---------+-----------+----------+--------------+ FV DistalPartial                                      Chronic        +---------+---------------+---------+-----------+----------+--------------+ PFV      Full                                                        +---------+---------------+---------+-----------+----------+--------------+ POP      Full           Yes  Yes                                 +---------+---------------+---------+-----------+----------+--------------+ PTV      Full                                                        +---------+---------------+---------+-----------+----------+--------------+ PERO     Partial                                      Chronic         +---------+---------------+---------+-----------+----------+--------------+     Summary: Right: No evidence of common femoral vein obstruction. Left: Findings consistent with chronic deep vein thrombosis involving the left femoral vein, and left peroneal veins. No cystic structure found in the popliteal fossa.  *See table(s) above for measurements and observations. Electronically signed by Monica Martinez MD on 10/03/2019 at 4:22:11 PM.    Final         Scheduled Meds: . chlorhexidine  15 mL Mouth Rinse BID  . insulin aspart  0-6 Units Subcutaneous Q4H  . mouth rinse  15 mL Mouth Rinse q12n4p   Continuous Infusions: . dextrose 150 mL/hr at 10/04/19 0428  . heparin 1,300 Units/hr (10/04/19 0430)  . [START ON 10/05/2019] levofloxacin (LEVAQUIN) IV       LOS: 2 days    Time spent: 35 minutes    Blain Pais, MD [facility]   If 7PM-7AM, please contact night-coverage [email or web address] Password [facility] 10/04/2019, 6:22 PM

## 2019-10-04 NOTE — Progress Notes (Signed)
@  2046 Paged Bodenheimer, NP of [facility] to notify pt of Rectal Temp of 102.2. Page returned, APAP administered, will continue to monitor.

## 2019-10-05 ENCOUNTER — Other Ambulatory Visit (HOSPITAL_COMMUNITY): Payer: PPO

## 2019-10-05 ENCOUNTER — Inpatient Hospital Stay (HOSPITAL_COMMUNITY): Payer: PPO

## 2019-10-05 LAB — BASIC METABOLIC PANEL
Anion gap: 14 (ref 5–15)
Anion gap: 14 (ref 5–15)
Anion gap: 13 (ref 5–15)
Anion gap: 16 — ABNORMAL HIGH (ref 5–15)
BUN: 131 mg/dL — ABNORMAL HIGH (ref 8–23)
BUN: 127 mg/dL — ABNORMAL HIGH (ref 8–23)
BUN: 130 mg/dL — ABNORMAL HIGH (ref 8–23)
BUN: 134 mg/dL — ABNORMAL HIGH (ref 8–23)
CO2: 20 mmol/L — ABNORMAL LOW (ref 22–32)
CO2: 19 mmol/L — ABNORMAL LOW (ref 22–32)
CO2: 20 mmol/L — ABNORMAL LOW (ref 22–32)
CO2: 19 mmol/L — ABNORMAL LOW (ref 22–32)
Calcium: 9.1 mg/dL (ref 8.9–10.3)
Calcium: 8.9 mg/dL (ref 8.9–10.3)
Calcium: 8.8 mg/dL — ABNORMAL LOW (ref 8.9–10.3)
Calcium: 8.8 mg/dL — ABNORMAL LOW (ref 8.9–10.3)
Chloride: 118 mmol/L — ABNORMAL HIGH (ref 98–111)
Chloride: 118 mmol/L — ABNORMAL HIGH (ref 98–111)
Chloride: 119 mmol/L — ABNORMAL HIGH (ref 98–111)
Chloride: 114 mmol/L — ABNORMAL HIGH (ref 98–111)
Creatinine, Ser: 4.86 mg/dL — ABNORMAL HIGH (ref 0.61–1.24)
Creatinine, Ser: 4.84 mg/dL — ABNORMAL HIGH (ref 0.61–1.24)
Creatinine, Ser: 5.09 mg/dL — ABNORMAL HIGH (ref 0.61–1.24)
Creatinine, Ser: 5.36 mg/dL — ABNORMAL HIGH (ref 0.61–1.24)
GFR calc Af Amer: 13 mL/min — ABNORMAL LOW (ref >60)
GFR calc Af Amer: 13 mL/min — ABNORMAL LOW (ref >60)
GFR calc Af Amer: 12 mL/min — ABNORMAL LOW (ref >60)
GFR calc Af Amer: 11 mL/min — ABNORMAL LOW (ref >60)
GFR calc non Af Amer: 11 mL/min — ABNORMAL LOW (ref >60)
GFR calc non Af Amer: 11 mL/min — ABNORMAL LOW (ref >60)
GFR calc non Af Amer: 10 mL/min — ABNORMAL LOW (ref >60)
GFR calc non Af Amer: 10 mL/min — ABNORMAL LOW (ref >60)
Glucose, Bld: 216 mg/dL — ABNORMAL HIGH (ref 70–99)
Glucose, Bld: 200 mg/dL — ABNORMAL HIGH (ref 70–99)
Glucose, Bld: 224 mg/dL — ABNORMAL HIGH (ref 70–99)
Glucose, Bld: 215 mg/dL — ABNORMAL HIGH (ref 70–99)
Potassium: 4.5 mmol/L (ref 3.5–5.1)
Potassium: 3.7 mmol/L (ref 3.5–5.1)
Potassium: 3.7 mmol/L (ref 3.5–5.1)
Potassium: 3.8 mmol/L (ref 3.5–5.1)
Sodium: 152 mmol/L — ABNORMAL HIGH (ref 135–145)
Sodium: 151 mmol/L — ABNORMAL HIGH (ref 135–145)
Sodium: 152 mmol/L — ABNORMAL HIGH (ref 135–145)
Sodium: 149 mmol/L — ABNORMAL HIGH (ref 135–145)

## 2019-10-05 LAB — CBC
HCT: 27.7 % — ABNORMAL LOW (ref 39.0–52.0)
Hemoglobin: 8.5 g/dL — ABNORMAL LOW (ref 13.0–17.0)
MCH: 32.2 pg (ref 26.0–34.0)
MCHC: 30.7 g/dL (ref 30.0–36.0)
MCV: 104.9 fL — ABNORMAL HIGH (ref 80.0–100.0)
Platelets: 62 10*3/uL — ABNORMAL LOW (ref 150–400)
RBC: 2.64 MIL/uL — ABNORMAL LOW (ref 4.22–5.81)
RDW: 14.9 % (ref 11.5–15.5)
WBC: 13.2 10*3/uL — ABNORMAL HIGH (ref 4.0–10.5)
nRBC: 0.2 % (ref 0.0–0.2)

## 2019-10-05 LAB — IRON AND TIBC
Iron: 13 ug/dL — ABNORMAL LOW (ref 45–182)
Saturation Ratios: 11 % — ABNORMAL LOW (ref 17.9–39.5)
TIBC: 119 ug/dL — ABNORMAL LOW (ref 250–450)
UIBC: 106 ug/dL

## 2019-10-05 LAB — FERRITIN: Ferritin: 613 ng/mL — ABNORMAL HIGH (ref 24–336)

## 2019-10-05 LAB — PROTIME-INR
INR: 2.1 — ABNORMAL HIGH (ref 0.8–1.2)
Prothrombin Time: 23.2 seconds — ABNORMAL HIGH (ref 11.4–15.2)

## 2019-10-05 LAB — GLUCOSE, CAPILLARY
Glucose-Capillary: 170 mg/dL — ABNORMAL HIGH (ref 70–99)
Glucose-Capillary: 183 mg/dL — ABNORMAL HIGH (ref 70–99)
Glucose-Capillary: 191 mg/dL — ABNORMAL HIGH (ref 70–99)
Glucose-Capillary: 197 mg/dL — ABNORMAL HIGH (ref 70–99)
Glucose-Capillary: 206 mg/dL — ABNORMAL HIGH (ref 70–99)
Glucose-Capillary: 221 mg/dL — ABNORMAL HIGH (ref 70–99)

## 2019-10-05 LAB — HEPARIN LEVEL (UNFRACTIONATED): Heparin Unfractionated: 0.24 IU/mL — ABNORMAL LOW (ref 0.30–0.70)

## 2019-10-05 LAB — VITAMIN B12: Vitamin B-12: 158 pg/mL — ABNORMAL LOW (ref 180–914)

## 2019-10-05 MED ORDER — IPRATROPIUM-ALBUTEROL 0.5-2.5 (3) MG/3ML IN SOLN: 3.0000 mL | RESPIRATORY_TRACT | Status: DC | PRN

## 2019-10-05 MED ORDER — CHLORHEXIDINE GLUCONATE CLOTH 2 % EX PADS
6.0000 | MEDICATED_PAD | Freq: Every day | CUTANEOUS | Status: DC
  Administered 2019-10-06 – 2019-10-09 (×4): 6 via TOPICAL

## 2019-10-05 MED ORDER — TAMSULOSIN HCL 0.4 MG PO CAPS: 0.4000 mg | ORAL_CAPSULE | Freq: Every day | ORAL | Status: DC

## 2019-10-05 MED ORDER — CYANOCOBALAMIN 1000 MCG/ML IJ SOLN
1000.0000 ug | Freq: Once | INTRAMUSCULAR | Status: AC
  Administered 2019-10-05: 1000 ug via INTRAMUSCULAR
  Filled 2019-10-05: qty 1

## 2019-10-05 MED ORDER — STERILE WATER FOR INJECTION IV SOLN
INTRAVENOUS | Status: DC
  Filled 2019-10-05 (×2): qty 850

## 2019-10-05 MED ORDER — BISACODYL 10 MG RE SUPP: 10.0000 mg | Freq: Every day | RECTAL | Status: DC | PRN

## 2019-10-05 NOTE — Progress Notes (Signed)
My team was notified regarding pts tachynea. Pt was arousable to voice and could answer simple questions. Denied pain and SOB. Pt has been febrile and is being treated for his fever. Pt is in no distress and remains on RA with sats 96-97%.

## 2019-10-05 NOTE — Progress Notes (Signed)
Patient difficult to arouse. Respiration rate in 30s. MEWS score Red 6. Called Rapid Response nurse Puja. Patient is stable. Will continue to monitor.  Paulene Floor, RN

## 2019-10-05 NOTE — Progress Notes (Signed)
Foley inserted per MD order. No complications. Tolerated well. Anderson Malta, RN assisted.  Paulene Floor, RN

## 2019-10-05 NOTE — Progress Notes (Signed)
Patients respirations increasing. Let Puja from RR know as well as Hayes Ludwig MD.  Paulene Floor, RN

## 2019-10-05 NOTE — Progress Notes (Signed)
Offoing RN report noted change in sound of respirations, rapid at bedside near 1900. Patient easily arousable when name called then and now. At this time RN asked patient if he feels bad he states "no I am just resting" Respirations with irregular rhythm at times between 19-28. SpO2 remains 95-98% room air. Will continue to treat fever.

## 2019-10-05 NOTE — Progress Notes (Signed)
PROGRESS NOTE    Mark Baird.  HQI:[id number] DOB: October 17, 1945 DOA: 10/02/2019 PCP: Sandi Mariscal, MD   Brief Narrative:  Mark Baird. is a 74 y.o. 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  Patient was found laying on the couch by his neighbor who called 911.  He was covered in urine and feces.  Last known normal a couple of days ago. EMS reported full body tremors in route and patient was given Versed.  No history could be obtained from the patient due to his altered mental status. In the ED he was afebrile, had tachycardia and tachypnea with normal BP. Had CT head, C-spine, pelvic Xray, left hip Xray, left femur X-ray, right clavicle X-ray that were all negative for acute process.  X-ray of left tibia and fibula showed sings of hardware failure around the patella.  His Scr was elevated to 4.6 from baseline of 2. Sodium level was elevated at 164.  COVID-19 and influenza negative.   Assessment & Plan:   Principal Problem:   Hypernatremia Active Problems:   Chronic renal insufficiency   AKI (acute kidney injury) ([place])   Toxic metabolic encephalopathy   Thrombocytopenia ([facility])   Left knee pain  Severe hypernatremia Suspect related to severe dehydration.  Corrected sodium 164 on presentation.  Patient was found down laying on his couch by his neighbor covered in urine and feces.  Unclear how long he has been in this condition.  Remains dehydrated. Sodium level gradually trending down.  -Continue D5W, change to 100 ml/hr from 173m/hr.  -Monitor sodium level every 4 hours and adjust rate of fluids accordingly.  Goal rate of correction 10-12 meq in 24 hours. Sodium level is gradually improving.   AKI on CKD stage 3, urinary retention, poa Suspect prerenal  due to severe dehydration. BUN 136, creatinine 4.6 on presetation.  Baseline creatinine 1.6-2.0. -Continue IV fluid hydration -Monitor renal function and urine output -Avoid nephrotoxic agents/contrast -Foley catheter inserted -Start Flomax -Renal [place], showed no obstruction but solitary left kidney -Monitor UOP -Started bicarb drip -Repeat labs in am, Nephrology consultation is renal function not improving.   Toxic metabolic encephalopathy Patient was seen by neurology and his presentation is thought to be due to toxic metabolic encephalopathy likely secondary to hyponatremia and AKI on CKD.  EEG was done in the ED and did not show evidence of seizure or status epilepticus.  Head CT negative for acute intracranial abnormality.  His presentation is not thought to be due to a CNS infection and neurology not recommending LP at this time. TSH mildly suppressed. Ammonia level wnl.  -Continue management of hypernatremia and AKI as mentioned above -Frequent neurochecks -Mentation is gradually improving but has moments of being somnolent. Had rapid response called on 1/30 afternoon but patient's mentation improved on its own. -Patient seen by SLP, diet advanced to dysphagia diet.    Left lower extremity pain Upon assessment done by ED provider, it was felt that he was experiencing pain in his left hip/lower extremity.  Pelvic x-ray without evidence of fracture. X-ray of left hip/pelvis negative for fracture. X-ray of left femur showing small left knee joint effusion and no acute osseous abnormality. X-ray of left tibia/fibula negative for fracture.  Showing postop changes about the patella with signs of hardware failure of unknown chronicity  associated with soft tissue swelling.  Marked tricompartmental osteoarthritic change. Orthopedic Surgery has evaluated with plan for eventually surgical intervention.    Chronic combined systolic and diastolic congestive heart failure Echo done May 2020  with LVEF 20% and grade 1 diastolic dysfunction.  Suspect left lower extremity edema is related to history of DVT.  Otherwise, does not appear clinically volume overloaded on exam.  Chest x-ray without evidence of volume overload.  Appears very dehydrated. -Hold diuretic at this time -Monitor volume status. Patient appears almost euvolemic now but will continue IV fluids due to hypernatremia.   History of LLE DVT in May 2020 Patient is on chronic anticoagulation with Coumadin but INR currently subtherapeutic at 1.6 on presentation.  -Hold coumadin due to anticipated surgical intervention.  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  No signs of active bleeding. -Continue to monitor  Normal anion gap metabolic acidosis Bicarb 19, anion gap 14.  Suspect related to AKI.   -Continue IV fluid hydration -Continue to monitor -Start sodium bicarb drip  Hyperglycemia Blood glucose 218.  No documented history of diabetes. Hgb A1c of 6.3%. -Continue  Sliding scale insulin very sensitive every 4 hours as patient is on D5W for hypernatremia.  Pneumonia, poa with elevated pct and hypoxia.  Chest x-ray showing minimal atelectasis or infiltrate at the left lung base.  Continue renally dosed Levaquin. Patient with low grade fever on 1/30. Continue Tylenol as needed.    DVT prophylaxis: heparin drip Code Status: Full code Family Communication: No family available at this time. Patient reports that he lives alone, not sure of who I could call to update but he will think about it.  Disposition Plan: Anticipate discharge after clinical improvement. May need SNF placement. Very slow improvement. Consults called: Neurology, orthopedics Procedures:  None Antimicrobials:  Levaquin 1/27>> Subjective: Patient seen earlier in the afternoon. Reports that he felt better. Denied  abdominal pain, nausea, or vomiting. Denied having lower extremity pain.  Patient with increase in somnolence later in the afternoon. Rapid response called but her RN, patient's mentation had quickly improved.   Objective: Vitals:   10/05/19 1645 10/05/19 1700 10/05/19 1715 10/05/19 1722  BP: (!) 123/45 (!) 112/55 (!) 97/59   Pulse: (!) 109 (!) 108 (!) 104 (!) 102  Resp: (!) 21 (!) 25 (!) 29 20  Temp:    99.8 F (37.7 C)  TempSrc:    Axillary  SpO2: 96% 96% 94%   Weight:        Intake/Output Summary (Last 24 hours) at 10/05/2019 1821 Last data filed at 10/05/2019 1714 Gross per 24 hour  Intake 4869.12 ml  Output 825 ml  Net 4044.12 ml   Filed Weights   10/03/19 0500 10/04/19 0340 10/05/19 0428  Weight: 78.9 kg 78.5 kg 80.1 kg    Examination:  General exam: Appears calm and comfortable  Respiratory system: Respiratory effort normal. Cardiovascular system: S1 & S2 present. RRR. Gastrointestinal system: Abdomen is nondistended, soft and nontender. Central nervous system: Alert and oriented. No focal neurological deficits. Extremities: left lower extremity non-pitting edema.  Skin: No rashes, lesions or ulcers Psychiatry: Mood & affect appropriate.     Data Reviewed: I have personally reviewed following labs and imaging studies  CBC: Recent Labs  Lab 10/02/19 1440 10/03/19 0403 10/04/19 0327 10/05/19 0340  WBC 8.8 8.1 8.2 13.2*  NEUTROABS 7.3  --   --   --  HGB 9.4* 9.1* 9.2* 8.5*  HCT 31.1* 28.5* 30.4* 27.7*  MCV 104.0* 100.4* 103.8* 104.9*  PLT 76* 75* 73* 62*   Basic Metabolic Panel: Recent Labs  Lab 10/04/19 2254 10/05/19 0340 10/05/19 0619 10/05/19 0949 10/05/19 1435  NA 154* 152* 151* 152* 149*  K 3.8 4.5 3.7 3.7 3.8  CL 124* 118* 118* 119* 114*  CO2 19* 20* 19* 20* 19*  GLUCOSE 243* 216* 200* 224* 215*  BUN 130* 131* 127* 130* 134*  CREATININE 4.69* 4.86* 4.84* 5.09* 5.36*  CALCIUM 8.9 9.1 8.9 8.8* 8.8*   GFR: Estimated Creatinine  Clearance: 12.9 mL/min (A) (by C-G formula based on SCr of 5.36 mg/dL (H)). Liver Function Tests: Recent Labs  Lab 10/02/19 1440  AST 30  ALT 43  ALKPHOS 96  BILITOT 1.5*  PROT 10.5*  ALBUMIN 2.6*   No results for input(s): LIPASE, AMYLASE in the last 168 hours. Recent Labs  Lab 10/02/19 2107  AMMONIA 11   Coagulation Profile: Recent Labs  Lab 10/02/19 1440 10/03/19 1216 10/04/19 0327 10/05/19 0340  INR 1.6* 2.0* 1.8* 2.1*   Cardiac Enzymes: Recent Labs  Lab 10/02/19 1440 10/04/19 1125  CKTOTAL 470* 222   BNP (last 3 results) No results for input(s): PROBNP in the last 8760 hours. HbA1C: Recent Labs    10/02/19 2101  HGBA1C 6.3*   CBG: Recent Labs  Lab 10/05/19 0005 10/05/19 0433 10/05/19 0802 10/05/19 1138 10/05/19 1638  GLUCAP 206* 170* 197* 191* 183*   Lipid Profile: No results for input(s): CHOL, HDL, LDLCALC, TRIG, CHOLHDL, LDLDIRECT in the last 72 hours. Thyroid Function Tests: Recent Labs    10/02/19 2107  TSH 0.320*   Anemia Panel: Recent Labs    10/02/19 2107 10/05/19 0949  VITAMINB12 380 158*  FERRITIN  --  613*  TIBC  --  119*  IRON  --  13*   Sepsis Labs: Recent Labs  Lab 10/02/19 2101  PROCALCITON 0.71    Recent Results (from the past 240 hour(s))  Respiratory Panel by RT PCR (Flu A&B, Covid) - Nasopharyngeal Swab     Status: None   Collection Time: 10/02/19  3:03 PM   Specimen: Nasopharyngeal Swab  Result Value Ref Range Status   SARS Coronavirus 2 by RT PCR NEGATIVE NEGATIVE Final    Comment: (NOTE) SARS-CoV-2 target nucleic acids are NOT DETECTED. The SARS-CoV-2 RNA is generally detectable in upper respiratoy specimens during the acute phase of infection. The lowest concentration of SARS-CoV-2 viral copies this assay can detect is 131 copies/mL. A negative result does not preclude SARS-Cov-2 infection and should not be used as the sole basis for treatment or other patient management decisions. A negative result  may occur with  improper specimen collection/handling, submission of specimen other than nasopharyngeal swab, presence of viral mutation(s) within the areas targeted by this assay, and inadequate number of viral copies (<131 copies/mL). A negative result must be combined with clinical observations, patient history, and epidemiological information. The expected result is Negative. Fact Sheet for Patients:  PinkCheek.be Fact Sheet for Healthcare Providers:  GravelBags.it This test is not yet ap proved or cleared by the [place] FDA and  has been authorized for detection and/or diagnosis of SARS-CoV-2 by FDA under an Emergency Use Authorization (EUA). This EUA will remain  in effect (meaning this test can be used) for the duration of the COVID-19 declaration under Section 564(b)(1) of the Act, 21 U.S.C. section 360bbb-3(b)(1), unless the authorization is terminated or revoked sooner.  Influenza A by PCR NEGATIVE NEGATIVE Final   Influenza B by PCR NEGATIVE NEGATIVE Final    Comment: (NOTE) The Xpert Xpress SARS-CoV-2/FLU/RSV assay is intended as an aid in  the diagnosis of influenza from Nasopharyngeal swab specimens and  should not be used as a sole basis for treatment. Nasal washings and  aspirates are unacceptable for Xpert Xpress SARS-CoV-2/FLU/RSV  testing. Fact Sheet for Patients: PinkCheek.be Fact Sheet for Healthcare Providers: GravelBags.it This test is not yet approved or cleared by the [place] FDA and  has been authorized for detection and/or diagnosis of SARS-CoV-2 by  FDA under an Emergency Use Authorization (EUA). This EUA will remain  in effect (meaning this test can be used) for the duration of the  Covid-19 declaration under Section 564(b)(1) of the Act, 21  U.S.C. section 360bbb-3(b)(1), unless the authorization is  terminated or  revoked. Performed at [facility], [place] [address], [place], [place] [address]          Radiology Studies: US RENAL  Result Date: 10/05/2019 CLINICAL DATA:  Acute renal failure.  Solitary left kidney. EXAM: RENAL / URINARY TRACT ULTRASOUND COMPLETE COMPARISON:  PET-CT on 08/23/2019 FINDINGS: Right Kidney: Not visualized. Left Kidney: Renal measurements: 11.8 x 6.5 x 5.6 cm = volume: 223 mL. Suboptimally visualized. Echogenicity within normal limits. No mass identified. Mild pelvicaliectasis is seen. Bladder: Appears normal for degree of bladder distention. Other: None. IMPRESSION: Solitary left kidney with mild pelvicaliectasis. Electronically Signed   By: Marlaine Hind M.D.   On: 10/05/2019 16:15        Scheduled Meds: . acetaminophen  325 mg Oral Once  . chlorhexidine  15 mL Mouth Rinse BID  . Chlorhexidine Gluconate Cloth  6 each Topical Daily  . insulin aspart  0-6 Units Subcutaneous Q4H  . mouth rinse  15 mL Mouth Rinse q12n4p  . tamsulosin  0.4 mg Oral QPC supper   Continuous Infusions: . dextrose 100 mL/hr at 10/05/19 1530  . heparin 1,450 Units/hr (10/05/19 0600)  . levofloxacin (LEVAQUIN) IV 500 mg (10/05/19 6184)  .  sodium bicarbonate (isotonic) infusion in sterile water 50 mL/hr at 10/05/19 1113     LOS: 3 days    Time spent: 35 minutes    Blain Pais, MD [facility]   If 7PM-7AM, please contact night-coverage [email or web address] Password [email or web address] 10/05/2019, 6:21 PM

## 2019-10-05 NOTE — Significant Event (Addendum)
Rapid Response Event Note  Overview: Neurologic - Decreased LOC and MEWS  Initial Focused Assessment: Bedside nurse called to me the bedside because she was having a hard time waking the patient up. Per nurse, about an hour ago patient was awake and at this baseline (baseline since admission) but now she could not arouse him, she also informed me that his rectal temp was 100.6 and his RR was in the 30s. I asked that a blood sugar be checked and ice packs be applied. Upon arrival, patient did not wake to my voice but did awake after sternal rub was performed. Once more awake, patient did follow my commands, was slow to respond but he was able to talk to [place]. We sang him the "Happy Birthday" song because it was birthday - he was able to sing along. When I arrived, RR in the 20s - HR low 100s and BP was stable. Patient was not in acute distress   Interventions: -- No RRT INTERVENTIONS   Plan of Care:  -- Given that patient has ongoing hypernatremia and is being treated for this and his overall kidney function seems to be worsening - this could be contributing to the fluctuations in his mental status.  -- Rest per medical team  -- RN to page MD and update MD  Event Summary:  Start Time 1520  End Time 1545   Infant Doane R

## 2019-10-05 NOTE — Progress Notes (Signed)
[place] for Heparin  Indication: VTE treatment   Allergies  Allergen Reactions  . Penicillins Swelling    Has patient had a PCN reaction causing immediate rash, facial/tongue/throat swelling, SOB or lightheadedness with hypotension: Yes Has patient had a PCN reaction causing severe rash involving mucus membranes or skin necrosis: No Has patient had a PCN reaction that required hospitalization:No Has patient had a PCN reaction occurring within the last 10 years: No If all of the above answers are "NO", then may proceed with Cephalosporin use.   . Molds & Smuts Other (See Comments)    Due to allergy testing.    Patient Measurements: Weight: 176 lb 9.4 oz (80.1 kg) Heparin Dosing Weight:   Vital Signs: Temp: 100.2 F (37.9 C) (01/30 0428) Temp Source: Axillary (01/30 0428) BP: 127/54 (01/30 0500) Pulse Rate: 102 (01/30 0500)  Labs: Recent Labs    10/02/19 1440 10/02/19 2101 10/03/19 0403 10/03/19 0802 10/03/19 1216 10/03/19 1900 10/04/19 0327 10/04/19 0327 10/04/19 1125 10/04/19 1125 10/04/19 1607 10/04/19 2254 10/05/19 0340 10/05/19 0342  HGB 9.4*  --  9.1*  --   --   --  9.2*  --   --   --   --   --   --   --   HCT 31.1*  --  28.5*  --   --   --  30.4*  --   --   --   --   --   --   --   PLT 76*  --  75*  --   --   --  73*  --   --   --   --   --   --   --   LABPROT 19.2*  --   --   --  22.8*  --  20.8*  --   --   --   --   --  23.2*  --   INR 1.6*  --   --   --  2.0*  --  1.8*  --   --   --   --   --  2.1*  --   HEPARINUNFRC  --   --   --   --   --    < > 0.37  --  0.28*  --   --   --   --  0.24*  CREATININE 4.67*   < > 4.34*   < > 4.56*   < > 4.45*   < > 4.30*   < > 4.68* 4.69* 4.86*  --   CKTOTAL 470*  --   --   --   --   --   --   --  222  --   --   --   --   --    < > = values in this interval not displayed.    Estimated Creatinine Clearance: 14.2 mL/min (A) (by C-G formula based on SCr of 4.86 mg/dL  (H)).  Assessment: 74 y.o. male with h/o DVT, Coumadin on hold, for heparin.    Goal of Therapy:  Heparin level 0.3-0.7 units/mL Monitor platelets by anticoagulation protocol: Yes   Plan:  Increase heparin to 1450 units/hr  Phillis Knack, PharmD, BCPS  10/05/2019 5:10 AM

## 2019-10-06 ENCOUNTER — Inpatient Hospital Stay (HOSPITAL_COMMUNITY): Payer: PPO

## 2019-10-06 LAB — GLUCOSE, CAPILLARY
Glucose-Capillary: 194 mg/dL — ABNORMAL HIGH (ref 70–99)
Glucose-Capillary: 198 mg/dL — ABNORMAL HIGH (ref 70–99)
Glucose-Capillary: 204 mg/dL — ABNORMAL HIGH (ref 70–99)
Glucose-Capillary: 219 mg/dL — ABNORMAL HIGH (ref 70–99)
Glucose-Capillary: 222 mg/dL — ABNORMAL HIGH (ref 70–99)
Glucose-Capillary: 233 mg/dL — ABNORMAL HIGH (ref 70–99)
Glucose-Capillary: 234 mg/dL — ABNORMAL HIGH (ref 70–99)

## 2019-10-06 LAB — PROTIME-INR
INR: 2.3 — ABNORMAL HIGH (ref 0.8–1.2)
INR: 2.3 — ABNORMAL HIGH (ref 0.8–1.2)
Prothrombin Time: 25.4 seconds — ABNORMAL HIGH (ref 11.4–15.2)
Prothrombin Time: 24.8 seconds — ABNORMAL HIGH (ref 11.4–15.2)

## 2019-10-06 LAB — RENAL FUNCTION PANEL
Albumin: 1.5 g/dL — ABNORMAL LOW (ref 3.5–5.0)
Anion gap: 16 — ABNORMAL HIGH (ref 5–15)
BUN: 138 mg/dL — ABNORMAL HIGH (ref 8–23)
CO2: 23 mmol/L (ref 22–32)
Calcium: 8.8 mg/dL — ABNORMAL LOW (ref 8.9–10.3)
Chloride: 110 mmol/L (ref 98–111)
Creatinine, Ser: 5.69 mg/dL — ABNORMAL HIGH (ref 0.61–1.24)
GFR calc Af Amer: 10 mL/min — ABNORMAL LOW (ref >60)
GFR calc non Af Amer: 9 mL/min — ABNORMAL LOW (ref >60)
Glucose, Bld: 228 mg/dL — ABNORMAL HIGH (ref 70–99)
Phosphorus: 4.6 mg/dL (ref 2.5–4.6)
Potassium: 3.9 mmol/L (ref 3.5–5.1)
Sodium: 149 mmol/L — ABNORMAL HIGH (ref 135–145)

## 2019-10-06 LAB — HEPATIC FUNCTION PANEL
ALT: 23 U/L (ref 0–44)
AST: 24 U/L (ref 15–41)
Albumin: 1.5 g/dL — ABNORMAL LOW (ref 3.5–5.0)
Alkaline Phosphatase: 74 U/L (ref 38–126)
Bilirubin, Direct: 0.6 mg/dL — ABNORMAL HIGH (ref 0.0–0.2)
Indirect Bilirubin: 2 mg/dL — ABNORMAL HIGH (ref 0.3–0.9)
Total Bilirubin: 2.6 mg/dL — ABNORMAL HIGH (ref 0.3–1.2)
Total Protein: 7.8 g/dL (ref 6.5–8.1)

## 2019-10-06 LAB — CBC
HCT: 24.9 % — ABNORMAL LOW (ref 39.0–52.0)
Hemoglobin: 7.9 g/dL — ABNORMAL LOW (ref 13.0–17.0)
MCH: 31.3 pg (ref 26.0–34.0)
MCHC: 31.7 g/dL (ref 30.0–36.0)
MCV: 98.8 fL (ref 80.0–100.0)
Platelets: 53 10*3/uL — ABNORMAL LOW (ref 150–400)
RBC: 2.52 MIL/uL — ABNORMAL LOW (ref 4.22–5.81)
RDW: 14.6 % (ref 11.5–15.5)
WBC: 12.1 10*3/uL — ABNORMAL HIGH (ref 4.0–10.5)
nRBC: 0.2 % (ref 0.0–0.2)

## 2019-10-06 LAB — HEPARIN LEVEL (UNFRACTIONATED): Heparin Unfractionated: 0.27 IU/mL — ABNORMAL LOW (ref 0.30–0.70)

## 2019-10-06 LAB — MAGNESIUM: Magnesium: 2.6 mg/dL — ABNORMAL HIGH (ref 1.7–2.4)

## 2019-10-06 NOTE — Progress Notes (Signed)
[place] for Heparin Indication: DVT  Allergies  Allergen Reactions  . Penicillins Swelling    Has patient had a PCN reaction causing immediate rash, facial/tongue/throat swelling, SOB or lightheadedness with hypotension: Yes Has patient had a PCN reaction causing severe rash involving mucus membranes or skin necrosis: No Has patient had a PCN reaction that required hospitalization:No Has patient had a PCN reaction occurring within the last 10 years: No If all of the above answers are "NO", then may proceed with Cephalosporin use.   . Molds & Smuts Other (See Comments)    Due to allergy testing.    Patient Measurements: Weight: 180 lb 12.4 oz (82 kg) Heparin Dosing Weight: 82 kg  Vital Signs: Temp: 99.8 F (37.7 C) (01/31 0706) Temp Source: Axillary (01/31 0706) BP: 116/44 (01/31 0706) Pulse Rate: 104 (01/31 0706)  Labs: Recent Labs    10/04/19 0327 10/04/19 0327 10/04/19 1125 10/04/19 1607 10/05/19 0340 10/05/19 0342 10/05/19 0619 10/05/19 0949 10/05/19 1435 10/06/19 0424  HGB 9.2*   < >  --   --  8.5*  --   --   --   --  7.9*  HCT 30.4*  --   --   --  27.7*  --   --   --   --  24.9*  PLT 73*  --   --   --  62*  --   --   --   --  53*  LABPROT 20.8*  --   --   --  23.2*  --   --   --   --  25.4*  INR 1.8*  --   --   --  2.1*  --   --   --   --  2.3*  HEPARINUNFRC 0.37   < > 0.28*  --   --  0.24*  --   --   --  0.27*  CREATININE 4.45*   < > 4.30*   < > 4.86*  --    < > 5.09* 5.36* 5.69*  CKTOTAL  --   --  222  --   --   --   --   --   --   --    < > = values in this interval not displayed.    Estimated Creatinine Clearance: 12.1 mL/min (A) (by C-G formula based on SCr of 5.69 mg/dL (H)).   Medical History: Past Medical History:  Diagnosis Date  . Chronic kidney disease (CKD)   . DVT (deep venous thrombosis) ([place])   . Multiple myeloma ([place])   . Solitary kidney     Assessment: 74 yo M with a history of LLE DVT in  May 2020 on chronic anticoagulation with Coumadin. Patient admitted on 1/27 after being found unresponsive. It is difficult to determine last doses of warfarin and schedule. INR upon admission was 1.6. IV heparin was started 1/28 with plans for possible surgery.  PTA regimen appears to be Warfarin 10 mg Daily.  Heparin level is still subtherapeutic at 0.27 after increasing dose to 1450 units/hr. However, INR therapeutic for second day at 2.3 even though he has not received any warfarin this admission. CBC trending down with Hgb at 7.9 and platelets at 53 today. No overt bleeding or infusion issues per RN.   After discussion with Dr. Hayes Ludwig, will stop heparin IV now given therapeutic INR and check INR again later this evening.  Goal of Therapy:  Heparin level 0.3-0.7 units/ml Monitor  platelets by anticoagulation protocol: Yes   Plan:  - Stop IV Heparin - Check INR at 1800 tonight - Plan to restart IV heparin when INR <2 - Monitor for signs of bleeding  Richardine Service, PharmD PGY1 Pharmacy Resident Phone: [phone] 10/06/2019  7:42 AM  Please check [email or web address] for unit-specific pharmacy phone numbers.

## 2019-10-06 NOTE — Progress Notes (Signed)
CCMD notified this RN that pt. Had a 25 beat run of atrial tach with a HR of 150. HR is back to baseline. Dr. Hayes Ludwig notified.  Paulene Floor, RN

## 2019-10-06 NOTE — Progress Notes (Signed)
PROGRESS NOTE    Mark Baird.  ZES:[id number] DOB: 13-May-1946 DOA: 10/02/2019 PCP: Sandi Mariscal, MD   Brief Narrative:  Mark Baird. is a 74 y.o. 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  Patient was found laying on the couch by his neighbor who called 911.  He was covered in urine and feces.  Last known normal a couple of days ago. EMS reported full body tremors in route and patient was given Versed.  No history could be obtained from the patient due to his altered mental status. In the ED he was afebrile, had tachycardia and tachypnea with normal BP. Had CT head, C-spine, pelvic Xray, left hip Xray, left femur X-ray, right clavicle X-ray that were all negative for acute process.  X-ray of left tibia and fibula showed sings of hardware failure around the patella.  His Scr was elevated to 4.6 from baseline of 2. Sodium level was elevated at 164.  COVID-19 and influenza negative.   Assessment & Plan:   Principal Problem:   Hypernatremia Active Problems:   Chronic renal insufficiency   AKI (acute kidney injury) ([place])   Toxic metabolic encephalopathy   Thrombocytopenia ([facility])   Left knee pain  Severe hypernatremia Suspect related to severe dehydration.  Corrected sodium 164 on presentation.  Patient was found down laying on his couch by his neighbor covered in urine and feces.  Unclear how long he has been in this condition.  Remains dehydrated. Sodium level gradually trending down.  -Continue D5W, 100 ml/hr -Monitor sodium level every 4 hours and adjust rate of fluids accordingly.  Goal rate of correction 10-12 meq in 24 hours. Sodium level is gradually improving.   AKI on CKD stage 3, urinary retention, poa Suspect prerenal due to severe  dehydration. BUN 136, creatinine 4.6 on presetation.  Baseline creatinine 1.6-2.0. -Continue IV fluid hydration -Monitor renal function and urine output -Avoid nephrotoxic agents/contrast -Foley catheter inserted -Started on Flomax -Renal US ordered, showed no obstruction but solitary left kidney -Monitor UOP -S/p bicarb drip, discontinued it on 1/31 -Repeat labs in am, Nephrology consultation requested as renal function not improving.   Toxic metabolic encephalopathy Patient was seen by neurology and his presentation is thought to be due to toxic metabolic encephalopathy likely secondary to hyponatremia and AKI on CKD.  EEG was done in the ED and did not show evidence of seizure or status epilepticus.  Head CT negative for acute intracranial abnormality.  His presentation is not thought to be due to a CNS infection and neurology not recommending LP at this time. TSH mildly suppressed. Ammonia level wnl.  -Continue management of hypernatremia and AKI as mentioned above -Frequent neurochecks -Mentation is gradually improving but has moments of being somnolent. Had rapid response called on 1/30 afternoon but patient's mentation improved on its own. -Patient seen by SLP, diet advanced to dysphagia diet but with assistance.    Left lower extremity pain Upon assessment done by ED provider, it was felt that he was experiencing pain in his left hip/lower extremity.  Pelvic x-ray without evidence of fracture. X-ray of left hip/pelvis negative for fracture. X-ray of left femur showing small left knee joint effusion and no acute osseous abnormality. X-ray of left tibia/fibula negative for fracture.  Showing postop changes about the patella with signs of hardware  failure of unknown chronicity associated with soft tissue swelling.  Marked tricompartmental osteoarthritic change. Orthopedic Surgery has evaluated with plan for eventually surgical intervention.    Chronic combined systolic and diastolic  congestive heart failure Echo done May 2020 with LVEF 20% and grade 1 diastolic dysfunction.  Suspect left lower extremity edema is related to history of DVT.  Otherwise, does not appear clinically volume overloaded on exam.  Chest x-ray without evidence of volume overload.  Appears very dehydrated. -Hold diuretic at this time -Monitor volume status. Patient appears almost euvolemic now but will continue IV fluids due to hypernatremia.   History of LLE DVT in May 2020 Patient is on chronic anticoagulation with Coumadin but INR subtherapeutic at 1.6 on presentation.  -Hold coumadin due to anticipated surgical intervention. -Trend INR, therapeutic on 1/31, heparin drip being held while INR therapeutic.  -Continue heparin drip per Pharmacy  -Monitor PT/INR -Left lower extremity with chronic DVT per Doppler US  Thrombocytopenia Platelet count 76,000, mildly low on previous labs as well but now worse.  No signs of active bleeding. -Continue to monitor  Normal anion gap metabolic acidosis Bicarb 19, anion gap 14.  Suspect related to AKI.   -Continue IV fluid hydration -s/p  sodium bicarb drip  Hyperglycemia Blood glucose 218.  No documented history of diabetes. Hgb A1c of 6.3%. -Continue  Sliding scale insulin very sensitive every 4 hours as patient is on D5W for hypernatremia.  Pneumonia, poa with elevated pct and hypoxia.  Chest x-ray showing minimal atelectasis or infiltrate at the left lung base.  Continue renally dosed Levaquin. Patient with low grade fever. Continue Tylenol as needed.   Multiple Myeloma.  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 Diagnosed after being worked up by Cardiology for his cardiomyopathy.  Patient apparently had been diagnosed 6-7 years ago with chemo and radiation, questionable bone marrow transplant.  He was in the process of starting chemo with [place] by Dr. Irene Limbo but patient could not manage getting the medication out of the  blister pack. He had appointment apparently on 1/21 but cancelled it due to having gout in his feet and left knee.  Of not, he had PET scan in Dec with no apparent active lesion but with focal hypometabolic activity and CT hypodensity in the Right frontal lobe with recommendation for MRI brain for further differentiation.  I spoke with his cousin Ozzie Hoyle today who confirmed that the patient was supposed to start chemo treatment for MM but he was not sure if he had done it yet.  -Will order MRI brain (without contrast due to renal failure).    DVT prophylaxis: heparin drip Code Status: Full code Family Communication: No family available at this time. Patient reports that he lives alone, not sure of who I could call to update but he will think about it.  Disposition Plan: Anticipate discharge after clinical improvement. May need SNF placement. Very slow improvement. Consults called: Neurology, orthopedics Procedures:  None Antimicrobials:  Levaquin 1/27>> Subjective: Patient seen earlier in the afternoon. Reports that he felt better. Denied abdominal pain, nausea, or vomiting. Denied having lower extremity pain.  Patient with increase in somnolence later in the afternoon. Rapid response called but her RN, patient's mentation had quickly improved.   Objective: Vitals:   10/06/19 1200 10/06/19 1359 10/06/19 1400 10/06/19 1624  BP: (!) 123/51  (!) 114/57   Pulse: (!) 104 (!) 105 (!) 107   Resp: (!) 23 (!) 23 (!) 23   Temp: 98.9  F (37.2 C)  98.8 F (37.1 C) 99.7 F (37.6 C)  TempSrc: Oral  Axillary Axillary  SpO2: 95% 94% 95%   Weight:        Intake/Output Summary (Last 24 hours) at 10/06/2019 1632 Last data filed at 10/06/2019 1600 Gross per 24 hour  Intake 1669.01 ml  Output 1525 ml  Net 144.01 ml   Filed Weights   10/04/19 0340 10/05/19 0428 10/06/19 0311  Weight: 78.5 kg 80.1 kg 82 kg    Examination:  General exam: Appears calm and comfortable  Respiratory system:  Respiratory effort normal. Cardiovascular system: S1 & S2 present. RRR. Gastrointestinal system: Abdomen is nondistended, soft and nontender. Central nervous system: Alert and oriented. No focal neurological deficits. Extremities: left lower extremity non-pitting edema.  Skin: No rashes, lesions or ulcers Psychiatry: Mood & affect appropriate.     Data Reviewed: I have personally reviewed following labs and imaging studies  CBC: Recent Labs  Lab 10/02/19 1440 10/03/19 0403 10/04/19 0327 10/05/19 0340 10/06/19 0424  WBC 8.8 8.1 8.2 13.2* 12.1*  NEUTROABS 7.3  --   --   --   --   HGB 9.4* 9.1* 9.2* 8.5* 7.9*  HCT 31.1* 28.5* 30.4* 27.7* 24.9*  MCV 104.0* 100.4* 103.8* 104.9* 98.8  PLT 76* 75* 73* 62* 53*   Basic Metabolic Panel: Recent Labs  Lab 10/05/19 0340 10/05/19 0619 10/05/19 0949 10/05/19 1435 10/06/19 0424  NA 152* 151* 152* 149* 149*  K 4.5 3.7 3.7 3.8 3.9  CL 118* 118* 119* 114* 110  CO2 20* 19* 20* 19* 23  GLUCOSE 216* 200* 224* 215* 228*  BUN 131* 127* 130* 134* 138*  CREATININE 4.86* 4.84* 5.09* 5.36* 5.69*  CALCIUM 9.1 8.9 8.8* 8.8* 8.8*  MG  --   --   --   --  2.6*  PHOS  --   --   --   --  4.6   GFR: Estimated Creatinine Clearance: 12.1 mL/min (A) (by C-G formula based on SCr of 5.69 mg/dL (H)). Liver Function Tests: Recent Labs  Lab 10/02/19 1440 10/06/19 0424 10/06/19 0917  AST 30  --  24  ALT 43  --  23  ALKPHOS 96  --  74  BILITOT 1.5*  --  2.6*  PROT 10.5*  --  7.8  ALBUMIN 2.6* 1.5* 1.5*   No results for input(s): LIPASE, AMYLASE in the last 168 hours. Recent Labs  Lab 10/02/19 2107  AMMONIA 11   Coagulation Profile: Recent Labs  Lab 10/02/19 1440 10/03/19 1216 10/04/19 0327 10/05/19 0340 10/06/19 0424  INR 1.6* 2.0* 1.8* 2.1* 2.3*   Cardiac Enzymes: Recent Labs  Lab 10/02/19 1440 10/04/19 1125  CKTOTAL 470* 222   BNP (last 3 results) No results for input(s): PROBNP in the last 8760 hours. HbA1C: No results for  input(s): HGBA1C in the last 72 hours. CBG: Recent Labs  Lab 10/06/19 0025 10/06/19 0330 10/06/19 0841 10/06/19 1201 10/06/19 1625  GLUCAP 204* 194* 219* 198* 233*   Lipid Profile: No results for input(s): CHOL, HDL, LDLCALC, TRIG, CHOLHDL, LDLDIRECT in the last 72 hours. Thyroid Function Tests: No results for input(s): TSH, T4TOTAL, FREET4, T3FREE, THYROIDAB in the last 72 hours. Anemia Panel: Recent Labs    10/05/19 0949  VITAMINB12 158*  FERRITIN 613*  TIBC 119*  IRON 13*   Sepsis Labs: Recent Labs  Lab 10/02/19 2101  PROCALCITON 0.71    Recent Results (from the past 240 hour(s))  Respiratory Panel by RT  PCR (Flu A&B, Covid) - Nasopharyngeal Swab     Status: None   Collection Time: 10/02/19  3:03 PM   Specimen: Nasopharyngeal Swab  Result Value Ref Range Status   SARS Coronavirus 2 by RT PCR NEGATIVE NEGATIVE Final    Comment: (NOTE) SARS-CoV-2 target nucleic acids are NOT DETECTED. The SARS-CoV-2 RNA is generally detectable in upper respiratoy specimens during the acute phase of infection. The lowest concentration of SARS-CoV-2 viral copies this assay can detect is 131 copies/mL. A negative result does not preclude SARS-Cov-2 infection and should not be used as the sole basis for treatment or other patient management decisions. A negative result may occur with  improper specimen collection/handling, submission of specimen other than nasopharyngeal swab, presence of viral mutation(s) within the areas targeted by this assay, and inadequate number of viral copies (<131 copies/mL). A negative result must be combined with clinical observations, patient history, and epidemiological information. The expected result is Negative. Fact Sheet for Patients:  PinkCheek.be Fact Sheet for Healthcare Providers:  GravelBags.it This test is not yet ap proved or cleared by the [place] FDA and  has been authorized  for detection and/or diagnosis of SARS-CoV-2 by FDA under an Emergency Use Authorization (EUA). This EUA will remain  in effect (meaning this test can be used) for the duration of the COVID-19 declaration under Section 564(b)(1) of the Act, 21 U.S.C. section 360bbb-3(b)(1), unless the authorization is terminated or revoked sooner.    Influenza A by PCR NEGATIVE NEGATIVE Final   Influenza B by PCR NEGATIVE NEGATIVE Final    Comment: (NOTE) The Xpert Xpress SARS-CoV-2/FLU/RSV assay is intended as an aid in  the diagnosis of influenza from Nasopharyngeal swab specimens and  should not be used as a sole basis for treatment. Nasal washings and  aspirates are unacceptable for Xpert Xpress SARS-CoV-2/FLU/RSV  testing. Fact Sheet for Patients: PinkCheek.be Fact Sheet for Healthcare Providers: GravelBags.it This test is not yet approved or cleared by the [place] FDA and  has been authorized for detection and/or diagnosis of SARS-CoV-2 by  FDA under an Emergency Use Authorization (EUA). This EUA will remain  in effect (meaning this test can be used) for the duration of the  Covid-19 declaration under Section 564(b)(1) of the Act, 21  U.S.C. section 360bbb-3(b)(1), unless the authorization is  terminated or revoked. Performed at [facility], [place] [address], [place], [place] [address]   Culture, blood (routine x 2)     Status: None (Preliminary result)   Collection Time: 10/05/19  6:35 PM   Specimen: BLOOD  Result Value Ref Range Status   Specimen Description BLOOD RIGHT ANTECUBITAL  Final   Special Requests   Final    BOTTLES DRAWN AEROBIC AND ANAEROBIC Blood Culture adequate volume   Culture   Final    NO GROWTH < 24 HOURS Performed at [facility], [place] [address] [place],  [address]    Report Status PENDING  Incomplete  Culture, blood (routine x 2)     Status: None (Preliminary result)   Collection  Time: 10/05/19  6:43 PM   Specimen: BLOOD LEFT HAND  Result Value Ref Range Status   Specimen Description BLOOD LEFT HAND  Final   Special Requests   Final    BOTTLES DRAWN AEROBIC ONLY Blood Culture adequate volume   Culture   Final    NO GROWTH < 24 HOURS Performed at [facility], [place] [address], [place],  [address]  Report Status PENDING  Incomplete         Radiology Studies: US RENAL  Result Date: 10/05/2019 CLINICAL DATA:  Acute renal failure.  Solitary left kidney. EXAM: RENAL / URINARY TRACT ULTRASOUND COMPLETE COMPARISON:  PET-CT on 08/23/2019 FINDINGS: Right Kidney: Not visualized. Left Kidney: Renal measurements: 11.8 x 6.5 x 5.6 cm = volume: 223 mL. Suboptimally visualized. Echogenicity within normal limits. No mass identified. Mild pelvicaliectasis is seen. Bladder: Appears normal for degree of bladder distention. Other: None. IMPRESSION: Solitary left kidney with mild pelvicaliectasis. Electronically Signed   By: Marlaine Hind M.D.   On: 10/05/2019 16:15        Scheduled Meds: . acetaminophen  325 mg Oral Once  . chlorhexidine  15 mL Mouth Rinse BID  . Chlorhexidine Gluconate Cloth  6 each Topical Daily  . insulin aspart  0-6 Units Subcutaneous Q4H  . mouth rinse  15 mL Mouth Rinse q12n4p  . tamsulosin  0.4 mg Oral QPC supper   Continuous Infusions: . dextrose 100 mL/hr at 10/06/19 1229  . levofloxacin (LEVAQUIN) IV 500 mg (10/05/19 0838)     LOS: 4 days    Time spent: 35 minutes    Blain Pais, MD [facility]   If 7PM-7AM, please contact night-coverage [email or web address] Password [facility] 10/06/2019, 4:32 PM

## 2019-10-07 ENCOUNTER — Inpatient Hospital Stay (HOSPITAL_COMMUNITY): Payer: PPO

## 2019-10-07 ENCOUNTER — Other Ambulatory Visit: Payer: Self-pay | Admitting: Hematology

## 2019-10-07 DIAGNOSIS — D696 Thrombocytopenia, unspecified: Secondary | ICD-10-CM

## 2019-10-07 DIAGNOSIS — R509 Fever, unspecified: Secondary | ICD-10-CM

## 2019-10-07 LAB — HEMOGLOBIN AND HEMATOCRIT, BLOOD
HCT: 24.1 % — ABNORMAL LOW (ref 39.0–52.0)
Hemoglobin: 8.1 g/dL — ABNORMAL LOW (ref 13.0–17.0)

## 2019-10-07 LAB — RENAL FUNCTION PANEL
Albumin: 1.4 g/dL — ABNORMAL LOW (ref 3.5–5.0)
Anion gap: 16 — ABNORMAL HIGH (ref 5–15)
BUN: 143 mg/dL — ABNORMAL HIGH (ref 8–23)
CO2: 20 mmol/L — ABNORMAL LOW (ref 22–32)
Calcium: 8.5 mg/dL — ABNORMAL LOW (ref 8.9–10.3)
Chloride: 110 mmol/L (ref 98–111)
Creatinine, Ser: 5.45 mg/dL — ABNORMAL HIGH (ref 0.61–1.24)
GFR calc Af Amer: 11 mL/min — ABNORMAL LOW (ref >60)
GFR calc non Af Amer: 10 mL/min — ABNORMAL LOW (ref >60)
Glucose, Bld: 216 mg/dL — ABNORMAL HIGH (ref 70–99)
Phosphorus: 4.9 mg/dL — ABNORMAL HIGH (ref 2.5–4.6)
Potassium: 4.3 mmol/L (ref 3.5–5.1)
Sodium: 146 mmol/L — ABNORMAL HIGH (ref 135–145)

## 2019-10-07 LAB — CBC
HCT: 21.3 % — ABNORMAL LOW (ref 39.0–52.0)
Hemoglobin: 6.7 g/dL — CL (ref 13.0–17.0)
MCH: 31.6 pg (ref 26.0–34.0)
MCHC: 31.5 g/dL (ref 30.0–36.0)
MCV: 100.5 fL — ABNORMAL HIGH (ref 80.0–100.0)
Platelets: 45 10*3/uL — ABNORMAL LOW (ref 150–400)
RBC: 2.12 MIL/uL — ABNORMAL LOW (ref 4.22–5.81)
RDW: 14.8 % (ref 11.5–15.5)
WBC: 12.4 10*3/uL — ABNORMAL HIGH (ref 4.0–10.5)
nRBC: 0.2 % (ref 0.0–0.2)

## 2019-10-07 LAB — PREPARE RBC (CROSSMATCH)

## 2019-10-07 LAB — URINALYSIS, ROUTINE W REFLEX MICROSCOPIC
Bilirubin Urine: NEGATIVE
Glucose, UA: NEGATIVE mg/dL
Ketones, ur: NEGATIVE mg/dL
Nitrite: NEGATIVE
Protein, ur: 30 mg/dL — AB
Specific Gravity, Urine: 1.012 (ref 1.005–1.030)
pH: 5 (ref 5.0–8.0)

## 2019-10-07 LAB — GLUCOSE, CAPILLARY
Glucose-Capillary: 189 mg/dL — ABNORMAL HIGH (ref 70–99)
Glucose-Capillary: 204 mg/dL — ABNORMAL HIGH (ref 70–99)
Glucose-Capillary: 211 mg/dL — ABNORMAL HIGH (ref 70–99)
Glucose-Capillary: 218 mg/dL — ABNORMAL HIGH (ref 70–99)
Glucose-Capillary: 221 mg/dL — ABNORMAL HIGH (ref 70–99)
Glucose-Capillary: 248 mg/dL — ABNORMAL HIGH (ref 70–99)

## 2019-10-07 LAB — PROTIME-INR
INR: 2.1 — ABNORMAL HIGH (ref 0.8–1.2)
Prothrombin Time: 23.9 seconds — ABNORMAL HIGH (ref 11.4–15.2)

## 2019-10-07 LAB — PROTEIN / CREATININE RATIO, URINE
Creatinine, Urine: 70.45 mg/dL
Protein Creatinine Ratio: 1.04 mg/mg{Cre} — ABNORMAL HIGH (ref 0.00–0.15)
Total Protein, Urine: 73 mg/dL

## 2019-10-07 LAB — ABO/RH: ABO/RH(D): A POS

## 2019-10-07 MED ORDER — NEPRO/CARBSTEADY PO LIQD
237.0000 mL | Freq: Three times a day (TID) | ORAL | Status: DC
  Administered 2019-10-09 – 2019-10-10 (×2): 237 mL via ORAL

## 2019-10-07 MED ORDER — SODIUM CHLORIDE 0.9% IV SOLUTION: Freq: Once | INTRAVENOUS | Status: AC

## 2019-10-07 MED ORDER — CHLORHEXIDINE GLUCONATE CLOTH 2 % EX PADS
6.0000 | MEDICATED_PAD | Freq: Every day | CUTANEOUS | Status: DC
  Administered 2019-10-08: 05:00:00 6 via TOPICAL

## 2019-10-07 MED ORDER — VITAMIN K1 10 MG/ML IJ SOLN
2.0000 mg | Freq: Once | INTRAVENOUS | Status: AC
  Administered 2019-10-07: 18:00:00 2 mg via INTRAVENOUS
  Filled 2019-10-07: qty 0.2

## 2019-10-07 MED ORDER — DEXTROSE 5 % IV SOLN: INTRAVENOUS | Status: DC

## 2019-10-07 MED ORDER — PHYTONADIONE 5 MG PO TABS
5.0000 mg | ORAL_TABLET | Freq: Once | ORAL | Status: DC
  Filled 2019-10-07: qty 1

## 2019-10-07 MED ORDER — INSULIN ASPART 100 UNIT/ML ~~LOC~~ SOLN
0.0000 [IU] | Freq: Three times a day (TID) | SUBCUTANEOUS | Status: DC
  Administered 2019-10-07 – 2019-10-08 (×3): 2 [IU] via SUBCUTANEOUS

## 2019-10-07 NOTE — Progress Notes (Addendum)
  Speech Language Pathology Treatment: Dysphagia  Patient Details Name: Mark Baird. MRN: [id number] DOB: [birth year] Today's Date: 10/07/2019 Time: 6578-4696 SLP Time Calculation (min) (ACUTE ONLY): 25 min  Assessment / Plan / Recommendation Clinical Impression  Patient received in bed, eyes open, jerking throughout session. Pt oxygen saturation 94%-98% and RR 18-30 per tele.  Patient with gurgly voice quality noted, able to state name when prompted. ST completed oral care with toothbrush via in-line suction, patient with poor secretion management. Pt oral cavity notable for some stringy secretions, ST removed.  ST able to elicit a very weak cough after several verbal prompts. ST unable to elicit a dry swallow, despite max prompts. RN reports she attempted to give patient a little water earlier today and he had immediate coughing. RN states patient has not been alert enough for solid PO. Chest xray this date noted: "New right basilar opacity is noted concerning for pneumonia."  ST provided patient small sip thin liquid water after oral care, patient orally holding bolus, water pooling in anterior oral cavity. ST removed with suction. ST re-attempted small sips of water, patient again with oral holding, no hyolaryngeal movement observed. No further PO trials as swallow attempt not observed and further PO deemed unsafe.   Recommend NPO with MBSS to be done at next earliest opportunity. Note: current plan for patient to go to dialysis tomorrow.   Secure initiated with MD/RN re: recommendation for NPO. MD responded in agreement.   Ice chips/small sips of water ok if patient fully alert/upright, after thorough oral care.    HPI HPI: 74 yo male admitted 10/02/19 with AMS and severe dehydration.Marland Kitchen PMH: solitary kidney, CKD3, DVT, multiple myeloma, encephalomalacia, R MCA infarct, nonischemic cardiomyopathy, HTN. CXR = minimal atelectasis or infiltrate at the left lung base, HeadCT no acute  abnormality      SLP Plan  New goals to be determined pending instrumental study;Continue with current plan of care       Recommendations  Diet recommendations: NPO Liquids provided via: Teaspoon Postural Changes and/or Swallow Maneuvers: Seated upright 90 degrees                Oral Care Recommendations: Oral care QID;Oral care prior to ice chip/H20 Follow up Recommendations: 24 hour supervision/assistance SLP Visit Diagnosis: Dysphagia, oropharyngeal phase (R13.12) Plan: New goals to be determined pending instrumental study;Continue with current plan of care       [place], M.Ed., [place] [phone]: Acute Rehab office [phone] - pager    Holden Maniscalco 10/07/2019, 5:00 PM

## 2019-10-07 NOTE — Consult Note (Signed)
[facility] Renal Consultation Note  Requesting MD: Debbe Odea, MD Indication for Consultation:  AKI   Chief complaint: was brought in with altered mental status   HPI:  Mark Baird. is a 74 y.o. male with a history of solitary kidney, reported CKD, multiple myeloma, history of prior CVA, nonischemic cardiomyopathy, and hypertension who presented to the ER with altered mental status.  Per report, the patient was found lying on his couch covered in feces and urine down by his neighbor who called 911; per charting had been seen normal 2 days prior.  He had AKI on 1/27 with creatinine of 4.67 and BUN of 136 on admission.  Unfortunately creatinine has climbed to 5.45 on consult.  Initially sodium was markedly elevated at 162 with glucose of 218.  Peak of 165 sodium and has improved at the time of consult to 146 today.  UA with 30 mg/dl protein and no RBC.  Renal ultrasound notable for solitary left kidney with mild pelvicaliectasis.  Patient had 1.6 liters UOP over 1/31 charted.  Foley was placed earlier this admission and he has been on D5.  His nurse says that this is the best that he has looked.  I spoke with his cousin, Mark Baird, who has been providing consents for treatment.  The patient does confirm that this is his closest relative as he has no children or siblings and is not married.  Mark Baird states that the patient's mental status has gradually become worse and worse.  He normally checks on him daily.  His memory has not been doing well.  Patient normally lives alone but [place] checks on him daily.  The patient is not able to provide much additional history when questioned regarding recent history, the current hospitalization, NSAID use, or difficulty with urination.  Patient cannot provide details regarding his hx myeloma.  Creatinine  Date/Time Value Ref Range Status  08/06/2019 12:50 PM 2.06 (H) 0.61 - 1.24 mg/dL Final   Creatinine, Ser  Date/Time Value Ref Range Status   10/07/2019 04:54 AM 5.45 (H) 0.61 - 1.24 mg/dL Final  10/06/2019 04:24 AM 5.69 (H) 0.61 - 1.24 mg/dL Final  10/05/2019 02:35 PM 5.36 (H) 0.61 - 1.24 mg/dL Final  10/05/2019 09:49 AM 5.09 (H) 0.61 - 1.24 mg/dL Final  10/05/2019 06:19 AM 4.84 (H) 0.61 - 1.24 mg/dL Final  10/05/2019 03:40 AM 4.86 (H) 0.61 - 1.24 mg/dL Final  10/04/2019 10:54 PM 4.69 (H) 0.61 - 1.24 mg/dL Final  10/04/2019 04:07 PM 4.68 (H) 0.61 - 1.24 mg/dL Final  10/04/2019 11:25 AM 4.30 (H) 0.61 - 1.24 mg/dL Final  10/04/2019 03:27 AM 4.45 (H) 0.61 - 1.24 mg/dL Final  10/04/2019 12:44 AM 4.44 (H) 0.61 - 1.24 mg/dL Final  10/03/2019 12:16 PM 4.56 (H) 0.61 - 1.24 mg/dL Final  10/03/2019 08:02 AM 4.41 (H) 0.61 - 1.24 mg/dL Final  10/03/2019 04:03 AM 4.34 (H) 0.61 - 1.24 mg/dL Final  10/03/2019 12:24 AM 4.49 (H) 0.61 - 1.24 mg/dL Final  10/02/2019 09:01 PM 4.54 (H) 0.61 - 1.24 mg/dL Final  10/02/2019 02:40 PM 4.67 (H) 0.61 - 1.24 mg/dL Final  08/14/2019 09:30 AM 2.06 (H) 0.61 - 1.24 mg/dL Final  01/04/2019 11:57 AM 1.67 (H) 0.76 - 1.27 mg/dL Final  07/01/2018 02:41 AM 2.08 (H) 0.61 - 1.24 mg/dL Final  06/30/2018 05:52 AM 2.05 (H) 0.61 - 1.24 mg/dL Final  06/28/2018 07:28 PM 1.95 (H) 0.61 - 1.24 mg/dL Final  10/11/2010 09:28 AM 1.39 0.40 - 1.50 mg/dL  Final     PMHx:   Past Medical History:  Diagnosis Date  . Chronic kidney disease (CKD)   . DVT (deep venous thrombosis) ([place])   . Multiple myeloma ([place])   . Solitary kidney     Past Surgical History:  Procedure Laterality Date  . RIGHT/LEFT HEART CATH AND CORONARY ANGIOGRAPHY N/A 09/11/2017   Procedure: RIGHT/LEFT HEART CATH AND CORONARY ANGIOGRAPHY;  Surgeon: Nigel Mormon, MD;  Location: [facility];  Service: Cardiovascular;  Laterality: N/A;    Family Hx:  Family History  Problem Relation Age of Onset  . Emphysema Father     Social History: Reviewed prior charting.  Patient with altered mental status   reports that he quit smoking about 30  years ago. His smoking use included cigarettes. He has a 30.00 pack-year smoking history. He has never used smokeless tobacco. He reports that he does not drink alcohol. No history on file for drug.  Allergies:  Allergies  Allergen Reactions  . Penicillins Swelling    Has patient had a PCN reaction causing immediate rash, facial/tongue/throat swelling, SOB or lightheadedness with hypotension: Yes Has patient had a PCN reaction causing severe rash involving mucus membranes or skin necrosis: No Has patient had a PCN reaction that required hospitalization:No Has patient had a PCN reaction occurring within the last 10 years: No If all of the above answers are "NO", then may proceed with Cephalosporin use.   . Molds & Smuts Other (See Comments)    Due to allergy testing.    Medications: Prior to Admission medications   Medication Sig Start Date End Date Taking? Authorizing Provider  acyclovir (ZOVIRAX) 200 MG capsule Take 1 capsule (200 mg total) by mouth 2 (two) times daily. 09/03/19  Yes Brunetta Genera, MD  allopurinol (ZYLOPRIM) 300 MG tablet Take 300 mg by mouth daily.  01/29/19  Yes [provider]  BIDIL 20-37.5 MG tablet TAKE 1/2 TABLET BY MOUTH TWICE DAILY Patient taking differently: Take 0.5 tablets by mouth 2 (two) times daily.  07/29/19  Yes Patwardhan, Manish J, MD  colchicine 0.6 MG tablet Take 0.6 mg by mouth as directed. 09/23/19  Yes [provider]  dexamethasone (DECADRON) 4 MG tablet Take 5 tablets (20 mg total) by mouth once a week. 09/03/19  Yes Brunetta Genera, MD  ixazomib citrate (NINLARO) 3 MG capsule Take 1 capsule (3 mg) by mouth weekly, 3 weeks on, 1 week off, repeat every 4 weeks. Take on an empty stomach 1hr before or 2hr after meals. 09/03/19  Yes Brunetta Genera, MD  metoprolol succinate (TOPROL-XL) 25 MG 24 hr tablet Take 1 tablet (25 mg total) by mouth daily. Take with or immediately following a meal. 04/22/19 10/03/19 Yes  Patwardhan, Manish J, MD  ondansetron (ZOFRAN) 8 MG tablet Take 1 tablet (8 mg total) by mouth every 8 (eight) hours as needed for nausea. 09/03/19  Yes Brunetta Genera, MD  potassium chloride SA (KLOR-CON) 20 MEQ tablet Take 40 mEq by mouth daily.   Yes [provider]  pravastatin (PRAVACHOL) 20 MG tablet Take 20 mg by mouth every evening.    Yes [provider]  torsemide (DEMADEX) 20 MG tablet Take 1 tablet (20 mg total) by mouth daily. 04/22/19  Yes Patwardhan, Manish J, MD  warfarin (COUMADIN) 10 MG tablet TAKE 1 TABLET(10 MG) BY MOUTH DAILY Patient taking differently: Take 10 mg by mouth daily.  08/05/19  Yes Patwardhan, Reynold Bowen, MD  I have reviewed the patient's reported prior to admission and current medications.  Labs:  BMP Latest Ref Rng & Units 10/07/2019 10/06/2019 10/05/2019  Glucose 70 - 99 mg/dL 216(H) 228(H) 215(H)  BUN 8 - 23 mg/dL 143(H) 138(H) 134(H)  Creatinine 0.61 - 1.24 mg/dL 5.45(H) 5.69(H) 5.36(H)  BUN/Creat Ratio 10 - 24 - - -  Sodium 135 - 145 mmol/L 146(H) 149(H) 149(H)  Potassium 3.5 - 5.1 mmol/L 4.3 3.9 3.8  Chloride 98 - 111 mmol/L 110 110 114(H)  CO2 22 - 32 mmol/L 20(L) 23 19(L)  Calcium 8.9 - 10.3 mg/dL 8.5(L) 8.8(L) 8.8(L)    Urinalysis    Component Value Date/Time   COLORURINE YELLOW 10/07/2019 0957   APPEARANCEUR CLOUDY (A) 10/07/2019 0957   LABSPEC 1.012 10/07/2019 0957   PHURINE 5.0 10/07/2019 0957   GLUCOSEU NEGATIVE 10/07/2019 0957   HGBUR MODERATE (A) 10/07/2019 0957   BILIRUBINUR NEGATIVE 10/07/2019 0957   KETONESUR NEGATIVE 10/07/2019 0957   PROTEINUR 30 (A) 10/07/2019 0957   NITRITE NEGATIVE 10/07/2019 0957   LEUKOCYTESUR TRACE (A) 10/07/2019 0957     ROS: Unable to reliably obtain secondary to altered mental status  Physical Exam: Vitals:   10/07/19 1245 10/07/19 1515  BP: 108/62 122/64  Pulse: (!) 106 (!) 102  Resp: (!) 21 (!) 26  Temp: 98.7 F (37.1 C) 98.6 F (37 C)  SpO2: 96% 98%     General:  Adult male in bed in no acute distress at rest HEENT: Normocephalic atraumatic Eyes: Extraocular movements intact sclera anicteric Neck: Supple trachea midline Heart: Tachycardic S1-S2 no rub appreciated Lungs: Clear but diminished on auscultation bilaterally normal work of breathing at rest on room air but increased work of breathing with exertion Abdomen: Soft nontender nondistended bowel sounds reduced Extremities: Skin: No rash on extremities exposed Neuro: Patient slowly provides his name but is not able to provide additional history or recent events.  Does follow some commands Psych no agitation or anxiety on exam Genitourinary Foley is in place with yellow urine  Assessment/Plan:  # Acute kidney injury - Multifactorial - recent prerenal insults and also in setting of multiple myeloma and solitary kidney with reported retention and mild pelvicaliectasis on ultrasound.  No improvement with fluids or foley.  Per his cousin functional status is normally better and pt lives alone at baseline.  Patient is uremic; his cousin has provided consent for dialysis - N.p.o. after midnight for dialysis on 2/2 after tunneled catheter placement.  IR is consulted for access placement.  He is not receiving warfarin (hx DVT) currently and INR 2.1 today. Vit K x 1.  Discussed with primary team -Please continue Foley catheter -Discontinue D5 after 12 hours for now and follow exam - Hold torsemide  - urine protein/cr ratio - Note goals of care are being discussed.  Pt's cousin does want him to have dialysis.   # Altered mental status  -Patient with hypernatremia as well as uremia.   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    Mark Baird 10/07/2019, 4:54 PM

## 2019-10-07 NOTE — Progress Notes (Addendum)
PROGRESS NOTE    Mark Baird.   OIN:[id number]  DOB: 03-14-1946  DOA: 10/02/2019 PCP: Sandi Mariscal, MD   Brief Narrative:  Mark Baird. 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   He was found laying on the couch by his neighbor who called 911. He was covered in urine and feces. Last known normal a couple of days ago. EMS reported full body tremors in route and patient was given Versed.No history could be obtained from the patient due to his altered mental status. In ED>tachycardia and tachypnea noted His Scr was elevated to 4.6 from baseline of 2. Sodium level was elevated at 164.  COVID-19 and influenza negative.     Subjective: sleepy    Assessment & Plan:   Principal Problem:   Hypernatremia  -Hypernatremia related to dehydration-sodium was 162 when admitted proved to 146 with D5 water which was started on admission and sodium levels noted to be steadily improving  Active Problems: AKI on CKD 3 with metabolic acidosis - history of solitary left kidney - Creatinine was 1.67 on 01/04/2019- 2.06 and 08/06/2019- 4.67 on 10/02/2019 has steadily risen to greater than 5 - Foley catheter placed in ED and Flomax started  - on Demadex at home which has been stopped - Renal ultrasound unremarkable- ? related to MM- Nephrology and Onc consulted - Addendum: nephology plans to dialyze tomorrow - Addendum: I spoke with Dr Irene Limbo who notes that the patient is a very poor candidate for chemo    Toxic metabolic encephalopathy- h/o CVA - large frontal CVA in the past - found lethargic at home -Related to AKI and hypernatremia -EEG and CT head done in ED were unrevealing -Ammonia level was normal -He is still quite sleepy  Suspected  pneumonia -Chest x-ray showed left lower lobe infiltrate versus atelectasis and he was started on levofloxacin -He continues to have a low-grade fever -Blood cultures have been negative -Due to ongoing fever today I have ordered a UA and urine culture-a chest x-ray performed today reveals a right basilar opacity concerning for pneumonia -Question if fevers are related to recurrent aspiration related to his lethargic state versus due to multiple myeloma - d/c Levaquin and follow  Chronic combined systolic and diastolic heart failure -Last echo was in 5/20 and revealed an EF of 20% with grade 1 diastolic heart failure -Diuretics are on hold due to severe dehydration related to them and poor oral intake  Acute anemia and thrombocytopenia  -With IV fluids being given, hemoglobin has steadily drifted down 9-6.7 today-we will transfuse 1 unit of packed red blood cells -Anemia panel checked on 10/05/2019 revealed an elevated ferritin at 613 and low iron levels -Platelets also noted to be drifting down from the 70s on admission to 45-note this is in setting of heparin and Coumadin use -Underlying history of multiple myeloma-we will consult his oncologist  Left lower extremity DVT The patient is on Coumadin but INR was subtherapeutic at 1.6 when he presented 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 The patient was receiving heparin which was discontinued as INR was therapeutic - Coumadin on hold  Left knee pain- prosthetic joint - ortho eval on 1/28 > painful to movement- no infection suspected by ortho but recommended that hardware be removed once more stable  Time spent  in minutes: 45 minutes DVT prophylaxis: Coumadin Code Status: Full code Family Communication:  Disposition Plan: He is quite ill  - requesting Oncology and Renal input today- request palliative care consult for [place]- cont IVF- hold coumadin- give 1 U PRBC today- stop Levaquin  Consultants:   Renal  consulted Procedures:   EEG Antimicrobials:  Anti-infectives (From admission, onward)   Start     Dose/Rate Route Frequency Ordered Stop   10/05/19 0800  levofloxacin (LEVAQUIN) IVPB 500 mg    Note to Pharmacy: Levaquin 500 mg IV q48h for CrCL < 20 mL/min   500 mg 100 mL/hr over 60 Minutes Intravenous Every 48 hours 10/03/19 0154     10/03/19 0300  levofloxacin (LEVAQUIN) IVPB 750 mg     750 mg 100 mL/hr over 90 Minutes Intravenous  Once 10/03/19 0154 10/03/19 0457       Objective: Vitals:   10/07/19 1201 10/07/19 1220 10/07/19 1230 10/07/19 1245  BP: 116/85 114/69 114/69 108/62  Pulse: (!) 107 (!) 107 (!) 107 (!) 106  Resp: 13 (!) 27 (!) 25 (!) 21  Temp: 99.1 F (37.3 C) 99.1 F (37.3 C) 99.1 F (37.3 C) 98.7 F (37.1 C)  TempSrc: Axillary Axillary Axillary Axillary  SpO2: 96% 97% 98% 96%  Weight:        Intake/Output Summary (Last 24 hours) at 10/07/2019 1341 Last data filed at 10/07/2019 1207 Gross per 24 hour  Intake --  Output 2375 ml  Net -2375 ml   Filed Weights   10/04/19 0340 10/05/19 0428 10/06/19 0311  Weight: 78.5 kg 80.1 kg 82 kg    Examination: General exam: Appears comfortable - very sleepy- responds to being touched HEENT: PERRLA, oral mucosa moist, no sclera icterus or thrush Respiratory system: Clear to auscultation. Respiratory effort normal. Cardiovascular system: S1 & S2 heard, RRR.   Gastrointestinal system: Abdomen soft, non-tender, nondistended. Normal bowel sounds. Central nervous system: sleepy- minimally verbal- No focal neurological deficits. Extremities: No cyanosis, clubbing or edema Skin: No rashes or ulcers Psychiatry:  Sleepy, flat affect    Data Reviewed: I have personally reviewed following labs and imaging studies  CBC: Recent Labs  Lab 10/02/19 1440 10/02/19 1440 10/03/19 0403 10/04/19 0327 10/05/19 0340 10/06/19 0424 10/07/19 0454  WBC 8.8   < > 8.1 8.2 13.2* 12.1* 12.4*  NEUTROABS 7.3  --   --   --   --   --    --   HGB 9.4*   < > 9.1* 9.2* 8.5* 7.9* 6.7*  HCT 31.1*   < > 28.5* 30.4* 27.7* 24.9* 21.3*  MCV 104.0*   < > 100.4* 103.8* 104.9* 98.8 100.5*  PLT 76*   < > 75* 73* 62* 53* 45*   < > = values in this interval not displayed.   Basic Metabolic Panel: Recent Labs  Lab 10/05/19 0619 10/05/19 0949 10/05/19 1435 10/06/19 0424 10/07/19 0454  NA 151* 152* 149* 149* 146*  K 3.7 3.7 3.8 3.9 4.3  CL 118* 119* 114* 110 110  CO2 19* 20* 19* 23 20*  GLUCOSE 200* 224* 215* 228* 216*  BUN 127* 130* 134* 138* 143*  CREATININE 4.84* 5.09* 5.36* 5.69* 5.45*  CALCIUM 8.9 8.8* 8.8* 8.8* 8.5*  MG  --   --   --  2.6*  --   PHOS  --   --   --  4.6 4.9*   GFR: Estimated Creatinine Clearance: 12.7 mL/min (A) (by C-G formula based on SCr of 5.45  mg/dL (H)). Liver Function Tests: Recent Labs  Lab 10/02/19 1440 10/06/19 0424 10/06/19 0917 10/07/19 0454  AST 30  --  24  --   ALT 43  --  23  --   ALKPHOS 96  --  74  --   BILITOT 1.5*  --  2.6*  --   PROT 10.5*  --  7.8  --   ALBUMIN 2.6* 1.5* 1.5* 1.4*   No results for input(s): LIPASE, AMYLASE in the last 168 hours. Recent Labs  Lab 10/02/19 2107  AMMONIA 11   Coagulation Profile: Recent Labs  Lab 10/04/19 0327 10/05/19 0340 10/06/19 0424 10/06/19 1847 10/07/19 0454  INR 1.8* 2.1* 2.3* 2.3* 2.1*   Cardiac Enzymes: Recent Labs  Lab 10/02/19 1440 10/04/19 1125  CKTOTAL 470* 222   BNP (last 3 results) No results for input(s): PROBNP in the last 8760 hours. HbA1C: No results for input(s): HGBA1C in the last 72 hours. CBG: Recent Labs  Lab 10/06/19 2020 10/06/19 2356 10/07/19 0425 10/07/19 0807 10/07/19 1205  GLUCAP 234* 222* 189* 211* 248*   Lipid Profile: No results for input(s): CHOL, HDL, LDLCALC, TRIG, CHOLHDL, LDLDIRECT in the last 72 hours. Thyroid Function Tests: No results for input(s): TSH, T4TOTAL, FREET4, T3FREE, THYROIDAB in the last 72 hours. Anemia Panel: Recent Labs    10/05/19 0949  VITAMINB12  158*  FERRITIN 613*  TIBC 119*  IRON 13*   Urine analysis:    Component Value Date/Time   COLORURINE YELLOW 10/07/2019 0957   APPEARANCEUR CLOUDY (A) 10/07/2019 0957   LABSPEC 1.012 10/07/2019 0957   PHURINE 5.0 10/07/2019 0957   GLUCOSEU NEGATIVE 10/07/2019 0957   HGBUR MODERATE (A) 10/07/2019 0957   BILIRUBINUR NEGATIVE 10/07/2019 0957   KETONESUR NEGATIVE 10/07/2019 0957   PROTEINUR 30 (A) 10/07/2019 0957   NITRITE NEGATIVE 10/07/2019 0957   LEUKOCYTESUR TRACE (A) 10/07/2019 0957   Sepsis Labs: _0 (procalcitonin:4,lacticidven:4) ) Recent Results (from the past 240 hour(s))  Respiratory Panel by RT PCR (Flu A&B, Covid) - Nasopharyngeal Swab     Status: None   Collection Time: 10/02/19  3:03 PM   Specimen: Nasopharyngeal Swab  Result Value Ref Range Status   SARS Coronavirus 2 by RT PCR NEGATIVE NEGATIVE Final    Comment: (NOTE) SARS-CoV-2 target nucleic acids are NOT DETECTED. The SARS-CoV-2 RNA is generally detectable in upper respiratoy specimens during the acute phase of infection. The lowest concentration of SARS-CoV-2 viral copies this assay can detect is 131 copies/mL. A negative result does not preclude SARS-Cov-2 infection and should not be used as the sole basis for treatment or other patient management decisions. A negative result may occur with  improper specimen collection/handling, submission of specimen other than nasopharyngeal swab, presence of viral mutation(s) within the areas targeted by this assay, and inadequate number of viral copies (<131 copies/mL). A negative result must be combined with clinical observations, patient history, and epidemiological information. The expected result is Negative. Fact Sheet for Patients:  PinkCheek.be Fact Sheet for Healthcare Providers:  GravelBags.it This test is not yet ap proved or cleared by the [place] FDA and  has been authorized for  detection and/or diagnosis of SARS-CoV-2 by FDA under an Emergency Use Authorization (EUA). This EUA will remain  in effect (meaning this test can be used) for the duration of the COVID-19 declaration under Section 564(b)(1) of the Act, 21 U.S.C. section 360bbb-3(b)(1), unless the authorization is terminated or revoked sooner.    Influenza A by PCR NEGATIVE NEGATIVE Final  Influenza B by PCR NEGATIVE NEGATIVE Final    Comment: (NOTE) The Xpert Xpress SARS-CoV-2/FLU/RSV assay is intended as an aid in  the diagnosis of influenza from Nasopharyngeal swab specimens and  should not be used as a sole basis for treatment. Nasal washings and  aspirates are unacceptable for Xpert Xpress SARS-CoV-2/FLU/RSV  testing. Fact Sheet for Patients: PinkCheek.be Fact Sheet for Healthcare Providers: GravelBags.it This test is not yet approved or cleared by the [place] FDA and  has been authorized for detection and/or diagnosis of SARS-CoV-2 by  FDA under an Emergency Use Authorization (EUA). This EUA will remain  in effect (meaning this test can be used) for the duration of the  Covid-19 declaration under Section 564(b)(1) of the Act, 21  U.S.C. section 360bbb-3(b)(1), unless the authorization is  terminated or revoked. Performed at [facility], [place] [address] [place], [place] [address]   Culture, blood (routine x 2)     Status: None (Preliminary result)   Collection Time: 10/05/19  6:35 PM   Specimen: BLOOD  Result Value Ref Range Status   Specimen Description BLOOD RIGHT ANTECUBITAL  Final   Special Requests   Final    BOTTLES DRAWN AEROBIC AND ANAEROBIC Blood Culture adequate volume   Culture   Final    NO GROWTH 2 DAYS Performed at [facility], [place] [address] [place], [place] [address]    Report Status PENDING  Incomplete  Culture, blood (routine x 2)     Status: None (Preliminary result)   Collection Time:  10/05/19  6:43 PM   Specimen: BLOOD LEFT HAND  Result Value Ref Range Status   Specimen Description BLOOD LEFT HAND  Final   Special Requests   Final    BOTTLES DRAWN AEROBIC ONLY Blood Culture adequate volume   Culture   Final    NO GROWTH 2 DAYS Performed at [facility], [place] [address] [place], [place] [address]    Report Status PENDING  Incomplete         Radiology Studies: MR BRAIN WO CONTRAST  Result Date: 10/06/2019 CLINICAL DATA:  74 year old male with solitary kidney and chronic renal disease. Altered mental status. Multiple myeloma. Abnormally decreased PET activity in the right frontal lobe on PET-CT earlier this month, thought to be right MCA territory encephalomalacia on subsequent noncontrast head CT. EXAM: MRI HEAD WITHOUT CONTRAST TECHNIQUE: Multiplanar, multiecho pulse sequences of the brain and surrounding structures were obtained without intravenous contrast. COMPARISON:  Head CT 10/02/2019 and earlier. FINDINGS: Study is intermittently degraded by motion artifact despite repeated imaging attempts. Brain: No restricted diffusion or evidence of acute infarction. Confirmed right frontal lobe middle and superior frontal gyri encephalomalacia (series 18, image 17). There is additional nearby right parietal lobe encephalomalacia. Mild associated hemosiderin. There is also patchy encephalomalacia in the right occipital lobe. Scattered additional cerebral white matter T2 and FLAIR hyperintensity. No midline shift, mass effect, evidence of mass lesion, ventriculomegaly, extra-axial collection or acute intracranial hemorrhage. Cervicomedullary junction and pituitary are within normal limits. Possible chronic microhemorrhage in the left cerebellum on series 14, image 15. Vascular: Major intracranial vascular flow voids are preserved. Skull and upper cervical spine: Cervical spine detail degraded by motion, but there is abnormal DWI signal in the visible cervical vertebrae. No  destructive calvarium lesion identified. Sinuses/Orbits: Negative orbits. Paranasal Visualized paranasal sinuses and mastoids are stable and well pneumatized. Other: Visible internal auditory structures appear normal. Scalp and face soft tissues appear negative. IMPRESSION:  1. No acute intracranial abnormality. 2. Chronic right MCA territory ischemia and encephalomalacia. Mild similar changes in the right PCA territory. 3. Motion degraded exam, with abnormal signal in the cervical spine suspicious for involvement by multiple myeloma. Electronically Signed   By: Genevie [name] M.D.   On: 10/06/2019 18:23   US RENAL  Result Date: 10/05/2019 CLINICAL DATA:  Acute renal failure.  Solitary left kidney. EXAM: RENAL / URINARY TRACT ULTRASOUND COMPLETE COMPARISON:  PET-CT on 08/23/2019 FINDINGS: Right Kidney: Not visualized. Left Kidney: Renal measurements: 11.8 x 6.5 x 5.6 cm = volume: 223 mL. Suboptimally visualized. Echogenicity within normal limits. No mass identified. Mild pelvicaliectasis is seen. Bladder: Appears normal for degree of bladder distention. Other: None. IMPRESSION: Solitary left kidney with mild pelvicaliectasis. Electronically Signed   By: Marlaine Hind M.D.   On: 10/05/2019 16:15   DG Chest Port 1 View  Result Date: 10/07/2019 CLINICAL DATA:  Fever. EXAM: PORTABLE CHEST 1 VIEW COMPARISON:  October 02, 2019. FINDINGS: Stable cardiomegaly. No pneumothorax is noted. Left lung is clear. New right basilar opacity is noted concerning for pneumonia. No significant pleural effusion is noted. Bony thorax is unremarkable. IMPRESSION: New right basilar opacity is noted concerning for pneumonia. Electronically Signed   By: Marijo Conception M.D.   On: 10/07/2019 10:25      Scheduled Meds: . acetaminophen  325 mg Oral Once  . chlorhexidine  15 mL Mouth Rinse BID  . Chlorhexidine Gluconate Cloth  6 each Topical Daily  . feeding supplement (NEPRO CARB STEADY)  237 mL Oral TID BM  . insulin aspart  0-6 Units  Subcutaneous TID WC  . mouth rinse  15 mL Mouth Rinse q12n4p  . tamsulosin  0.4 mg Oral QPC supper   Continuous Infusions: . dextrose 100 mL/hr at 10/07/19 0830  . levofloxacin (LEVAQUIN) IV 500 mg (10/07/19 0836)     LOS: 5 days      Debbe Odea, MD [facility] Pager: [email or web address] Password [facility] 10/07/2019, 1:41 PM

## 2019-10-07 NOTE — Progress Notes (Signed)
Initial Nutrition Assessment  RD working remotely.  DOCUMENTATION CODES:   Not applicable  INTERVENTION:   -Feeding assistance with meals -Nepro Shake po TID, each supplement provides 425 kcal and 19 grams protein -Magic cup BID with meals, each supplement provides 290 kcal and 9 grams of protein  NUTRITION DIAGNOSIS:   Inadequate oral intake related to lethargy/confusion as evidenced by meal completion < 25%.  GOAL:   Patient will meet greater than or equal to 90% of their needs  MONITOR:   PO intake, Supplement acceptance, Diet advancement, Labs, Weight trends, Skin, I & O's  REASON FOR ASSESSMENT:   Low [place]    ASSESSMENT:   Mark Baird. is a 74 y.o. 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  Patient was found laying on the couch by his neighbor who called 911.  He was covered in urine and feces.  Last known normal a couple of days ago. EMS reported full body tremors in route and patient was given Versed.  No history could be obtained from the patient due to his altered mental status.  Pt admitted with severe hypernatremia.   1/29- s/p BSE- advanced to dysphagia 1 diet with thin liquids  Reviewed I/O's: -1.6 L x 24 hours and +3.2 L since admission  UOP: 1.6 L x 24 hours   Attempted to speak with pt via phone, however, no answer. RD unable to obtain further nutrition-related history at this time.   Per chart review, pt remains with periods of somnolence.   Reviewed meal completion records; noted meal completion 0%. Per SLP notes, pt is at a moderate aspiration risk and with poor dentition.   Reviewed wt hx; noted UBW around 168#. Per nursing assessment, pt with generalized moderate edema, which is likely masking further weight  loss as well as fat and muscle depletions. Noted wt has been increasing since admission, likely related to rehydration.   Given poor oral intake and changes in mentation, pt would benefit from feeding assistance with meals and addition of oral nutrition supplements to assist with adequate oral intake.   Medications reviewed and include dextrose 5% solution @ 100 ml/hr.   Lab Results  Component Value Date   HGBA1C 6.3 (H) 10/02/2019   PTA DM medications are none.   Labs reviewed: Na: 136, Phos: 4.9, CBGS: 189-234 (inpatient orders for glycemic control are 0-6 units insulin aspart TID with meals).   Diet Order:   Diet Order            DIET - DYS 1 Room service appropriate? No; Fluid consistency: Thin  Diet effective now              EDUCATION NEEDS:   No education needs have been identified at this time  Skin:  Skin Assessment: Reviewed RN Assessment  Last BM:  10/05/19  Height:   Ht Readings from Last 1 Encounters:  09/02/19 5' 11" (1.803 m)    Weight:   Wt Readings from Last 1 Encounters:  10/06/19 82 kg    Ideal Body Weight:  78.2 kg  BMI:  Body mass index is 25.21 kg/m.  Estimated Nutritional Needs:   Kcal:  1950-2150  Protein:  100-115 grams  Fluid:  1.9-2.1 L    Jenifer A. Jimmye Norman, RD, LDN, [place] Registered Dietitian II Certified Diabetes Care  and Education Specialist Pager: [phone] After hours Pager: [phone]

## 2019-10-07 NOTE — Progress Notes (Signed)
[place] for Heparin when INR > 2 Indication: DVT  Allergies  Allergen Reactions  . Penicillins Swelling    Has patient had a PCN reaction causing immediate rash, facial/tongue/throat swelling, SOB or lightheadedness with hypotension: Yes Has patient had a PCN reaction causing severe rash involving mucus membranes or skin necrosis: No Has patient had a PCN reaction that required hospitalization:No Has patient had a PCN reaction occurring within the last 10 years: No If all of the above answers are "NO", then may proceed with Cephalosporin use.   . Molds & Smuts Other (See Comments)    Due to allergy testing.    Patient Measurements: Weight: 180 lb 12.4 oz (82 kg) Heparin Dosing Weight: 82 kg  Vital Signs: Temp: 100 F (37.8 C) (02/01 0427) Temp Source: Axillary (02/01 0427) BP: 109/59 (02/01 0427) Pulse Rate: 95 (02/01 0000)  Labs: Recent Labs    10/04/19 1125 10/04/19 1607 10/05/19 0340 10/05/19 0340 10/05/19 0342 10/05/19 0619 10/05/19 1435 10/06/19 0424 10/06/19 1847 10/07/19 0454  HGB  --   --  8.5*   < >  --   --   --  7.9*  --  6.7*  HCT  --   --  27.7*  --   --   --   --  24.9*  --  21.3*  PLT  --   --  62*  --   --   --   --  53*  --  45*  LABPROT  --   --  23.2*   < >  --   --   --  25.4* 24.8* 23.9*  INR  --   --  2.1*   < >  --   --   --  2.3* 2.3* 2.1*  HEPARINUNFRC 0.28*  --   --   --  0.24*  --   --  0.27*  --   --   CREATININE 4.30*   < > 4.86*  --   --    < > 5.36* 5.69*  --  5.45*  CKTOTAL 222  --   --   --   --   --   --   --   --   --    < > = values in this interval not displayed.    Estimated Creatinine Clearance: 12.7 mL/min (A) (by C-G formula based on SCr of 5.45 mg/dL (H)).   Medical History: Past Medical History:  Diagnosis Date  . Chronic kidney disease (CKD)   . DVT (deep venous thrombosis) ([place])   . Multiple myeloma ([place])   . Solitary kidney     Assessment: 74 yo M with a history of  LLE DVT in May 2020 on chronic anticoagulation with Coumadin. Patient admitted on 1/27 after being found unresponsive. It is difficult to determine last doses of warfarin and schedule. INR upon admission was 1.6. IV heparin was started 1/28 with plans for possible surgery.  PTA regimen appears to be Warfarin 10 mg Daily.  INR therapeutic at 2.1 even though he has not received any warfarin this admission. CBC trending down with Hgb at 6.7 and platelets falling to 45 today. No overt bleeding or infusion issues per RN.   Pharmacy asked to start IV heparin once INR < 2.  Goal of Therapy:  Heparin level 0.3-0.7 units/ml Monitor platelets by anticoagulation protocol: Yes   Plan:  - Plan to restart IV heparin when INR <2 - Will recheck AM  INR.  Marguerite Olea, [facility] Clinical Pharmacist Phone [phone]  10/07/2019 7:29 AM

## 2019-10-08 ENCOUNTER — Inpatient Hospital Stay (HOSPITAL_COMMUNITY): Payer: PPO

## 2019-10-08 DIAGNOSIS — Z66 Do not resuscitate: Secondary | ICD-10-CM

## 2019-10-08 DIAGNOSIS — R531 Weakness: Secondary | ICD-10-CM

## 2019-10-08 DIAGNOSIS — Z515 Encounter for palliative care: Secondary | ICD-10-CM

## 2019-10-08 LAB — RENAL FUNCTION PANEL
Albumin: 1.5 g/dL — ABNORMAL LOW (ref 3.5–5.0)
Anion gap: 15 (ref 5–15)
BUN: 148 mg/dL — ABNORMAL HIGH (ref 8–23)
CO2: 22 mmol/L (ref 22–32)
Calcium: 8.6 mg/dL — ABNORMAL LOW (ref 8.9–10.3)
Chloride: 112 mmol/L — ABNORMAL HIGH (ref 98–111)
Creatinine, Ser: 4.54 mg/dL — ABNORMAL HIGH (ref 0.61–1.24)
GFR calc Af Amer: 14 mL/min — ABNORMAL LOW (ref >60)
GFR calc non Af Amer: 12 mL/min — ABNORMAL LOW (ref >60)
Glucose, Bld: 237 mg/dL — ABNORMAL HIGH (ref 70–99)
Phosphorus: 4.5 mg/dL (ref 2.5–4.6)
Potassium: 3.8 mmol/L (ref 3.5–5.1)
Sodium: 149 mmol/L — ABNORMAL HIGH (ref 135–145)

## 2019-10-08 LAB — PROTIME-INR
INR: 1.6 — ABNORMAL HIGH (ref 0.8–1.2)
Prothrombin Time: 19.1 seconds — ABNORMAL HIGH (ref 11.4–15.2)

## 2019-10-08 LAB — GLUCOSE, CAPILLARY
Glucose-Capillary: 216 mg/dL — ABNORMAL HIGH (ref 70–99)
Glucose-Capillary: 220 mg/dL — ABNORMAL HIGH (ref 70–99)
Glucose-Capillary: 223 mg/dL — ABNORMAL HIGH (ref 70–99)
Glucose-Capillary: 201 mg/dL — ABNORMAL HIGH (ref 70–99)
Glucose-Capillary: 188 mg/dL — ABNORMAL HIGH (ref 70–99)

## 2019-10-08 LAB — RETICULOCYTES
Immature Retic Fract: 14.5 % (ref 2.3–15.9)
RBC.: 2.24 MIL/uL — ABNORMAL LOW (ref 4.22–5.81)
Retic Count, Absolute: 24 10*3/uL (ref 19.0–186.0)
Retic Ct Pct: 1.1 % (ref 0.4–3.1)

## 2019-10-08 LAB — FOLATE RBC
Folate, Hemolysate: 198 ng/mL
Folate, RBC: 865 ng/mL (ref >498)
Hematocrit: 22.9 % — ABNORMAL LOW (ref 37.5–51.0)

## 2019-10-08 LAB — CBC
HCT: 23.5 % — ABNORMAL LOW (ref 39.0–52.0)
Hemoglobin: 7.6 g/dL — ABNORMAL LOW (ref 13.0–17.0)
MCH: 31.8 pg (ref 26.0–34.0)
MCHC: 32.3 g/dL (ref 30.0–36.0)
MCV: 98.3 fL (ref 80.0–100.0)
Platelets: 39 10*3/uL — ABNORMAL LOW (ref 150–400)
RBC: 2.39 MIL/uL — ABNORMAL LOW (ref 4.22–5.81)
RDW: 14.8 % (ref 11.5–15.5)
WBC: 9.9 10*3/uL (ref 4.0–10.5)
nRBC: 0 % (ref 0.0–0.2)

## 2019-10-08 LAB — TYPE AND SCREEN
ABO/RH(D): A POS
Antibody Screen: NEGATIVE
Unit division: 0

## 2019-10-08 LAB — VITAMIN B12: Vitamin B-12: >7500 pg/mL — ABNORMAL HIGH (ref 180–914)

## 2019-10-08 LAB — IRON AND TIBC
Iron: 28 ug/dL — ABNORMAL LOW (ref 45–182)
Saturation Ratios: 22 % (ref 17.9–39.5)
TIBC: 125 ug/dL — ABNORMAL LOW (ref 250–450)
UIBC: 97 ug/dL

## 2019-10-08 LAB — BPAM RBC
Blood Product Expiration Date: 202102242359
ISSUE DATE / TIME: 202102011213
Unit Type and Rh: 6200

## 2019-10-08 LAB — URINE CULTURE: Culture: NO GROWTH

## 2019-10-08 LAB — LACTATE DEHYDROGENASE: LDH: 122 U/L (ref 98–192)

## 2019-10-08 LAB — FOLATE: Folate: 7 ng/mL (ref >5.9)

## 2019-10-08 LAB — FERRITIN: Ferritin: 864 ng/mL — ABNORMAL HIGH (ref 24–336)

## 2019-10-08 MED ORDER — INSULIN ASPART 100 UNIT/ML ~~LOC~~ SOLN
0.0000 [IU] | SUBCUTANEOUS | Status: DC
  Administered 2019-10-08: 21:00:00 2 [IU] via SUBCUTANEOUS
  Administered 2019-10-08 – 2019-10-09 (×3): 3 [IU] via SUBCUTANEOUS
  Administered 2019-10-09: 2 [IU] via SUBCUTANEOUS

## 2019-10-08 MED ORDER — DEXTROSE 5 % IV SOLN: INTRAVENOUS | Status: DC

## 2019-10-08 MED ORDER — DEXTROSE 5 % IV SOLN
0.5000 g | Freq: Three times a day (TID) | INTRAVENOUS | Status: DC
  Administered 2019-10-08 – 2019-10-09 (×3): 0.5 g via INTRAVENOUS
  Filled 2019-10-08 (×7): qty 0.5

## 2019-10-08 MED ORDER — METRONIDAZOLE IN NACL 5-0.79 MG/ML-% IV SOLN
500.0000 mg | Freq: Three times a day (TID) | INTRAVENOUS | Status: DC
  Administered 2019-10-08 – 2019-10-09 (×3): 500 mg via INTRAVENOUS
  Filled 2019-10-08 (×3): qty 100

## 2019-10-08 MED ORDER — INSULIN ASPART 100 UNIT/ML ~~LOC~~ SOLN: 0.0000 [IU] | Freq: Every day | SUBCUTANEOUS | Status: DC

## 2019-10-08 NOTE — Consult Note (Signed)
Consultation Note Date: 10/08/2019   Patient Name: Mark Baird.  DOB: Aug 20, 1946  MRN: [id number]  Age / Sex: 74 y.o., male  PCP: Mark Mariscal, MD Referring Physician: Debbe Odea, MD  Reason for Consultation: Establishing goals of care and Psychosocial/spiritual support  HPI/Patient Profile: 74 y.o. male   admitted on 10/02/2019 with past medical history significant of solitary kidney, CKD 3, DVT on chronic Coumadin, multiple myeloma/Dr. 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   Patient was found laying on the couch by his neighbor who called 911.  He was covered in urine and feces.  Last known normal a couple of days ago.   Patient admitted for treatment and stabilization secondary to hypernatremia and acute on chronic kidney injury.  Chest x-ray significant for left lower lobe infiltrate versus atelectasis, started on levofloxacin.  Patient remains intermittently confused and without medical decision capacity.  Listed contact is his Baird and only/ main support person.  Patient has no other living family.  That person is Mr. Mark Baird and he faces treatment option decisions, advanced directive decisions and anticipatory care needs for the patient     Clinical Assessment and Goals of Care:   This NP Wadie Lessen reviewed medical records, received report from team, assessed the patient and then spoke to his Baird/Mark Baird to discuss diagnosis, prognosis, GOC, EOL wishes disposition and options.  Concept of Hospice and Palliative Care were discussed  A detailed discussion was had today regarding advanced directives.  Concepts specific to code status, artifical feeding and hydration, continued IV antibiotics and rehospitalization was had.  The  difference between a aggressive medical intervention path  and a palliative comfort care path for this patient at this time was had.  Values and goals of care important to patient and family were attempted to be elicited.  Created space and opportunity for Mark Baird to explore his thoughts and feelings regarding his family members current medical situation.  He verbalized that he did not "realize how sick he was".  We did discuss advanced directives and Mr. Mark Baird verbalized that the patient would never want to be resuscitated at this time in his life, I will document DNR/DNI   We discussed the importance of continued conversation in hopes of clarifying Mr. [name] wishes and a treatment plan that reflects his wishes and best interest.  Mr..  Mark Baird agrees to meet with me tomorrow at noon  Emotional support offered   Questions and concerns addressed.   Family encouraged to call with questions or concerns.    PMT will continue to support holistically.    No documented HPOA, Mark Baird the individual with an established relationship with the patient acting in good faith and reliably conveying the wishes of the patient.     SUMMARY OF RECOMMENDATIONS    Code Status/Advance Care Planning:  DNR-documented today   Palliative Prophylaxis:   Aspiration,  Bowel Regimen, Delirium Protocol, Frequent Pain Assessment and Oral Care   Psycho-social/Spiritual:   Desire for further Chaplaincy support:yes  Additional Recommendations: Education on Hospice  Prognosis:   Unable to determine  Discharge Planning: To Be Determined      Primary Diagnoses: Present on Admission: . Hypernatremia . Chronic renal insufficiency   I have reviewed the medical record, interviewed the patient and family, and examined the patient. The following aspects are pertinent.  Past Medical History:  Diagnosis Date  . Chronic kidney disease (CKD)   . DVT (deep venous thrombosis) ([place])   .  Multiple myeloma ([place])   . Solitary kidney    Social History   Socioeconomic History  . Marital status: Single    Spouse name: Not on file  . Number of children: 0  . Years of education: Not on file  . Highest education level: Not on file  Occupational History  . Not on file  Tobacco Use  . Smoking status: Former Smoker    Packs/day: 1.00    Years: 30.00    Pack years: 30.00    Types: Cigarettes    Quit date: 02/20/1989    Years since quitting: 30.6  . Smokeless tobacco: Never Used  Substance and Sexual Activity  . Alcohol use: No  . Drug use: Not on file  . Sexual activity: Not on file  Other Topics Concern  . Not on file  Social History Narrative  . Not on file   Social Determinants of Health   Financial Resource Strain:   . Difficulty of Paying Living Expenses: Not on file  Food Insecurity:   . Worried About Charity fundraiser in the Last Year: Not on file  . Ran Out of Food in the Last Year: Not on file  Transportation Needs:   . Lack of Transportation (Medical): Not on file  . Lack of Transportation (Non-Medical): Not on file  Physical Activity:   . Days of Exercise per Week: Not on file  . Minutes of Exercise per Session: Not on file  Stress:   . Feeling of Stress : Not on file  Social Connections:   . Frequency of Communication with Friends and Family: Not on file  . Frequency of Social Gatherings with Friends and Family: Not on file  . Attends Religious Services: Not on file  . Active Member of Clubs or Organizations: Not on file  . Attends Archivist Meetings: Not on file  . Marital Status: Not on file   Family History  Problem Relation Age of Onset  . Emphysema Father    Scheduled Meds: . acetaminophen  325 mg Oral Once  . chlorhexidine  15 mL Mouth Rinse BID  . Chlorhexidine Gluconate Cloth  6 each Topical Daily  . feeding supplement (NEPRO CARB STEADY)  237 mL Oral TID BM  . insulin aspart  0-6 Units Subcutaneous TID WC  . mouth  rinse  15 mL Mouth Rinse q12n4p  . tamsulosin  0.4 mg Oral QPC supper   Continuous Infusions: . metronidazole     PRN Meds:.acetaminophen **OR** acetaminophen, bisacodyl, ipratropium-albuterol Medications Prior to Admission:  Prior to Admission medications   Medication Sig Start Date End Date Taking? Authorizing Provider  acyclovir (ZOVIRAX) 200 MG capsule Take 1 capsule (200 mg total) by mouth 2 (two) times daily. 09/03/19  Yes Brunetta Genera, MD  allopurinol (ZYLOPRIM) 300 MG tablet Take 300 mg by mouth daily.  01/29/19  Yes [provider]  Beulah Gandy  20-37.5 MG tablet TAKE 1/2 TABLET BY MOUTH TWICE DAILY Patient taking differently: Take 0.5 tablets by mouth 2 (two) times daily.  07/29/19  Yes Patwardhan, Manish J, MD  colchicine 0.6 MG tablet Take 0.6 mg by mouth as directed. 09/23/19  Yes [provider]  dexamethasone (DECADRON) 4 MG tablet Take 5 tablets (20 mg total) by mouth once a week. 09/03/19  Yes Brunetta Genera, MD  ixazomib citrate (NINLARO) 3 MG capsule Take 1 capsule (3 mg) by mouth weekly, 3 weeks on, 1 week off, repeat every 4 weeks. Take on an empty stomach 1hr before or 2hr after meals. 09/03/19  Yes Brunetta Genera, MD  metoprolol succinate (TOPROL-XL) 25 MG 24 hr tablet Take 1 tablet (25 mg total) by mouth daily. Take with or immediately following a meal. 04/22/19 10/03/19 Yes Patwardhan, Manish J, MD  ondansetron (ZOFRAN) 8 MG tablet Take 1 tablet (8 mg total) by mouth every 8 (eight) hours as needed for nausea. 09/03/19  Yes Brunetta Genera, MD  potassium chloride SA (KLOR-CON) 20 MEQ tablet Take 40 mEq by mouth daily.   Yes [provider]  pravastatin (PRAVACHOL) 20 MG tablet Take 20 mg by mouth every evening.    Yes [provider]  torsemide (DEMADEX) 20 MG tablet Take 1 tablet (20 mg total) by mouth daily. 04/22/19  Yes Patwardhan, Manish J, MD  warfarin (COUMADIN) 10 MG tablet TAKE 1 TABLET(10 MG) BY MOUTH  DAILY Patient taking differently: Take 10 mg by mouth daily.  08/05/19  Yes Patwardhan, Reynold Bowen, MD   Allergies  Allergen Reactions  . Penicillins Swelling    Has patient had a PCN reaction causing immediate rash, facial/tongue/throat swelling, SOB or lightheadedness with hypotension: Yes Has patient had a PCN reaction causing severe rash involving mucus membranes or skin necrosis: No Has patient had a PCN reaction that required hospitalization:No Has patient had a PCN reaction occurring within the last 10 years: No If all of the above answers are "NO", then may proceed with Cephalosporin use.   . Molds & Smuts Other (See Comments)    Due to allergy testing.   Review of Systems  Unable to perform ROS: Acuity of condition    Physical Exam Constitutional:      Appearance: He is cachectic. He is ill-appearing.  Cardiovascular:     Rate and Rhythm: Bradycardia present.  Musculoskeletal:     Comments: Neurolysed weakness  Skin:    General: Skin is warm and dry.     Vital Signs: BP (!) 114/58 (BP Location: Right Arm)   Pulse (!) 43   Temp 98.5 F (36.9 C) (Axillary)   Resp 19   Wt 79.1 kg   SpO2 97%   BMI 24.32 kg/m  Pain Scale: 0-10   Pain Score: 0-No pain   SpO2: SpO2: 97 % O2 Device:SpO2: 97 % O2 Flow Rate: .O2 Flow Rate (L/min): 1 L/min  IO: Intake/output summary:   Intake/Output Summary (Last 24 hours) at 10/08/2019 1352 Last data filed at 10/08/2019 0436 Gross per 24 hour  Intake 330 ml  Output 1650 ml  Net -1320 ml    LBM: Last BM Date: 10/05/19 Baseline Weight: Weight: 78.9 kg Most recent weight: Weight: 79.1 kg     Palliative Assessment/Data: 30 % at best   Discussed with Dr. Wynelle Cleveland via secure chat  Time In: 1600 Time Out: 1715 Time Total: 70 minutes Greater than 50%  of this time was spent counseling and coordinating care  related to the above assessment and plan.  Signed by: Wadie Lessen, NP   Please contact Palliative Medicine Team phone at  [phone] for questions and concerns.  For individual provider: See Shea Evans

## 2019-10-08 NOTE — Progress Notes (Addendum)
Pharmacy Antibiotic Note  Mark Baird. is a 74 y.o. 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 There is concern for pneumonia. Pharmacy has been consulted for aztreonam dosing.  Pt is noted to be allergic to PCN. Pt is experiencing AKI on CKD3. Scr has increased from baseline of ~ 2 to 4.54.  Plan: Start IV aztreonam 500 mg q8 hours Continue flagyl 500 mg q8h per MD. Follow renal function, WBC, temp, and cultures Watch for clinical improvement F/U LOT  Weight: 174 lb 6.1 oz (79.1 kg)  Temp (24hrs), Avg:99 F (37.2 C), Min:98.5 F (36.9 C), Max:99.4 F (37.4 C)  Recent Labs  Lab 10/04/19 0327 10/04/19 1125 10/05/19 0340 10/05/19 0619 10/05/19 0949 10/05/19 1435 10/06/19 0424 10/07/19 0454 10/08/19 0458  WBC 8.2  --  13.2*  --   --   --  12.1* 12.4* 9.9  CREATININE 4.45*   < > 4.86*   < > 5.09* 5.36* 5.69* 5.45* 4.54*   < > = values in this interval not displayed.    Estimated Creatinine Clearance: 15.2 mL/min (A) (by C-G formula based on SCr of 4.54 mg/dL (H)).    Allergies  Allergen Reactions  . Penicillins Swelling    Has patient had a PCN reaction causing immediate rash, facial/tongue/throat swelling, SOB or lightheadedness with hypotension: Yes Has patient had a PCN reaction causing severe rash involving mucus membranes or skin necrosis: No Has patient had a PCN reaction that required hospitalization:No Has patient had a PCN reaction occurring within the last 10 years: No If all of the above answers are "NO", then may proceed with Cephalosporin use.   . Molds & Smuts Other (See Comments)    Due to allergy testing.    Antimicrobials this admission: LVQ 1/27>>2/1 Aztreonam 2/2>>  Microbiology results: 1/27 Flu/COVID neg 1/30 Bcx x 3: NGTD  Thank you for  allowing pharmacy to be a part of this patient's care. Sherren Kerns, PharmD PGY1 Acute Care Pharmacy Resident 10/08/2019 2:15 PM

## 2019-10-08 NOTE — Progress Notes (Signed)
SLP Cancellation Note  Patient Details Name: Mark Baird. MRN: [id number] DOB: 03-02-1946   Cancelled treatment:       Reason Eval/Treat Not Completed: Medical issues which prohibited therapy. Pt NPO this morning pending procedure with plans also to go to HD after per RN. Will f/u for PO trials and potential for MBS as able.    Osie Bond., M.A. [place] [facility] Services Pager [phone] Office [phone]  10/08/2019, 8:58 AM

## 2019-10-08 NOTE — Plan of Care (Signed)
Poc progressing.  

## 2019-10-08 NOTE — Progress Notes (Addendum)
PROGRESS NOTE    Mark A Nalepa Jr.   MRN:[id number]  DOB: 06/09/1946  DOA: 10/02/2019 PCP: Sun, Yun, MD   Brief Narrative:  Mark A Neisen Jr. 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   He was found laying on the couch by his neighbor who called 911. He was covered in urine and feces. Last known normal a couple of days ago. EMS reported full body tremors in route and patient was given Versed.No history could be obtained from the patient due to his altered mental status. In ED>tachycardia and tachypnea noted His Scr was elevated to 4.6 from baseline of 2. Sodium level was elevated at 164.  COVID-19 and influenza negative.     Subjective: More alert today. Very confused. Does not know birthday and does not know where he is.     Assessment & Plan:   Principal Problem:   Hypernatremia  -Hypernatremia related to dehydration-sodium was 162 when admitted proved to 146 with D5 water which was started on admission and sodium levels noted to be steadily improving  Active Problems: AKI on CKD 3 with metabolic acidosis - history of solitary left kidney - Creatinine was 1.67 on 01/04/2019- 2.06 and 08/06/2019- 4.67 on 10/02/2019 has steadily risen to greater than 5- it has improved to 4.54 today - Foley catheter placed in ED and Flomax started  - on Demadex at home which has been stopped - Renal ultrasound unremarkable- ? related to MM- Nephrology and Onc consulted- Dr Kale states he is not a candidate for chemo - nephrology does not feel he is a candidate for dialysis either- cont to follow Cr and treat conservatively  - he continues to make a good amount of urine daily    Toxic metabolic encephalopathy- h/o CVA - large frontal CVA in the  past - found lethargic at home -EEG and CT head done in ED were unrevealing -Ammonia level was normal - this is possibly related to uremia due to AKI and also hypernatremia- he is alert but remains quite confused   Suspected pneumonia -Chest x-ray showed left lower lobe infiltrate versus atelectasis and he was started on levofloxacin -He continued to have a low-grade fever despite Levaquin -Blood cultures have been negative - 1/21> Due to ongoing fever I ordered a UA and urine culture - UA showed only few bacteria and WBCs- chest x-ray reveals a right basilar opacity concerning for pneumonia - no leukocytosis noted and no hypoxia - d/c'd Levaquin as he was having fever despite this - he is allergic to PCN which causes severe swelling and rash- start Azactam and Flagyl for RLL infiltrate  Acute anemia and thrombocytopenia  -With IV fluids being given, hemoglobin has steadily drifted down from 9 to 6.7  - transfused 1 unit of packed red blood cells on 2/1 -Anemia panel checked on 10/05/2019 revealed an elevated ferritin at 613 and low iron levels - Platelets now 39-d/c heparin and reversed Coumadin with 2 mg Vit K to prevent bleeding - ? If drop in Hb and platelets are related to Multiple myeloma- as mentioned, Dr Kale recommends palliative care/hospice  Hyperglycemia - start SSI- sugars in 200s- last A1c was 6.3- no formal diagnosis of  DM on the chart  Chronic combined systolic and diastolic heart failure -Last echo was in 5/20 and revealed an   EF of 20% with grade 1 diastolic heart failure -Diuretics are on hold due to severe dehydration related to them and poor oral intake  Left lower extremity DVT The patient is on Coumadin but INR was subtherapeutic at 1.6 when he presented 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 The patient was receiving heparin which was discontinued as INR was therapeutic - Coumadin on hold as mentioned above  Left knee pain-  Hardware due to a  prior left patella repair is nearly puncturing his skin - ortho eval on 1/28 > painful to movement- no infection suspected by ortho but recommended that hardware be removed once more stable  Time spent in minutes: 45 minutes DVT prophylaxis: Coumadin Code Status: Full code- Addendum- now DNR after palliative care RN, Mary Larach spoke with cousin. Family Communication:  Disposition Plan: He is quite ill  - with ongoing drop in Hb and platelets, he has a very poor prognosis. No chemo planned by Oncology and no dialysis recommended by Nephrology. I have consulted palliative care.  Consultants:   Renal consulted  Phone consult with oncology, Dr Kale  Orthopedic surgery Procedures:   EEG Antimicrobials:  Anti-infectives (From admission, onward)   Start     Dose/Rate Route Frequency Ordered Stop   10/05/19 0800  levofloxacin (LEVAQUIN) IVPB 500 mg  Status:  Discontinued    Note to Pharmacy: Levaquin 500 mg IV q48h for CrCL < 20 mL/min   500 mg 100 mL/hr over 60 Minutes Intravenous Every 48 hours 10/03/19 0154 10/07/19 1411   10/03/19 0300  levofloxacin (LEVAQUIN) IVPB 750 mg     750 mg 100 mL/hr over 90 Minutes Intravenous  Once 10/03/19 0154 10/03/19 0457       Objective: Vitals:   10/07/19 2111 10/08/19 0128 10/08/19 0430 10/08/19 0830  BP: (!) 133/57 106/73  (!) 114/58  Pulse: 100 (!) 102 75 (!) 43  Resp: 20 20 (!) 24 19  Temp:  98.5 F (36.9 C)    TempSrc:  Axillary    SpO2: 96% 97% 97% 97%  Weight:   79.1 kg     Intake/Output Summary (Last 24 hours) at 10/08/2019 1335 Last data filed at 10/08/2019 0436 Gross per 24 hour  Intake 330 ml  Output 1650 ml  Net -1320 ml   Filed Weights   10/05/19 0428 10/06/19 0311 10/08/19 0430  Weight: 80.1 kg 82 kg 79.1 kg    Examination: General exam: Appears comfortable - very sleepy- responds to being touched HEENT: PERRLA, oral mucosa moist, no sclera icterus or thrush Respiratory system: Clear to auscultation. Respiratory  effort normal. Cardiovascular system: S1 & S2 heard, RRR.   Gastrointestinal system: Abdomen soft, non-tender, nondistended. Normal bowel sounds. Central nervous system: sleepy- minimally verbal- No focal neurological deficits. Extremities: No cyanosis, clubbing or edema Skin: No rashes or ulcers Psychiatry:  Sleepy, flat affect    Data Reviewed: I have personally reviewed following labs and imaging studies  CBC: Recent Labs  Lab 10/02/19 1440 10/03/19 0403 10/04/19 0327 10/04/19 0327 10/05/19 0340 10/06/19 0424 10/07/19 0454 10/07/19 1710 10/08/19 0458  WBC 8.8   < > 8.2  --  13.2* 12.1* 12.4*  --  9.9  NEUTROABS 7.3  --   --   --   --   --   --   --   --   HGB 9.4*   < > 9.2*   < > 8.5* 7.9* 6.7* 8.1* 7.6*  HCT 31.1*   < > 30.4*   < >   27.7* 24.9* 21.3* 24.1* 23.5*  MCV 104.0*   < > 103.8*  --  104.9* 98.8 100.5*  --  98.3  PLT 76*   < > 73*  --  62* 53* 45*  --  39*   < > = values in this interval not displayed.   Basic Metabolic Panel: Recent Labs  Lab 10/05/19 0949 10/05/19 1435 10/06/19 0424 10/07/19 0454 10/08/19 0458  NA 152* 149* 149* 146* 149*  K 3.7 3.8 3.9 4.3 3.8  CL 119* 114* 110 110 112*  CO2 20* 19* 23 20* 22  GLUCOSE 224* 215* 228* 216* 237*  BUN 130* 134* 138* 143* 148*  CREATININE 5.09* 5.36* 5.69* 5.45* 4.54*  CALCIUM 8.8* 8.8* 8.8* 8.5* 8.6*  MG  --   --  2.6*  --   --   PHOS  --   --  4.6 4.9* 4.5   GFR: Estimated Creatinine Clearance: 15.2 mL/min (A) (by C-G formula based on SCr of 4.54 mg/dL (H)). Liver Function Tests: Recent Labs  Lab 10/02/19 1440 10/06/19 0424 10/06/19 0917 10/07/19 0454 10/08/19 0458  AST 30  --  24  --   --   ALT 43  --  23  --   --   ALKPHOS 96  --  74  --   --   BILITOT 1.5*  --  2.6*  --   --   PROT 10.5*  --  7.8  --   --   ALBUMIN 2.6* 1.5* 1.5* 1.4* 1.5*   No results for input(s): LIPASE, AMYLASE in the last 168 hours. Recent Labs  Lab 10/02/19 2107  AMMONIA 11   Coagulation Profile: Recent  Labs  Lab 10/05/19 0340 10/06/19 0424 10/06/19 1847 10/07/19 0454 10/08/19 0458  INR 2.1* 2.3* 2.3* 2.1* 1.6*   Cardiac Enzymes: Recent Labs  Lab 10/02/19 1440 10/04/19 1125  CKTOTAL 470* 222   BNP (last 3 results) No results for input(s): PROBNP in the last 8760 hours. HbA1C: No results for input(s): HGBA1C in the last 72 hours. CBG: Recent Labs  Lab 10/07/19 2000 10/07/19 2333 10/08/19 0409 10/08/19 0806 10/08/19 1223  GLUCAP 221* 204* 216* 220* 223*   Lipid Profile: No results for input(s): CHOL, HDL, LDLCALC, TRIG, CHOLHDL, LDLDIRECT in the last 72 hours. Thyroid Function Tests: No results for input(s): TSH, T4TOTAL, FREET4, T3FREE, THYROIDAB in the last 72 hours. Anemia Panel: Recent Labs    10/08/19 1001  VITAMINB12 >7,500*  FOLATE 7.0  FERRITIN 864*  TIBC 125*  IRON 28*  RETICCTPCT 1.1   Urine analysis:    Component Value Date/Time   COLORURINE YELLOW 10/07/2019 0957   APPEARANCEUR CLOUDY (A) 10/07/2019 0957   LABSPEC 1.012 10/07/2019 0957   PHURINE 5.0 10/07/2019 0957   GLUCOSEU NEGATIVE 10/07/2019 0957   HGBUR MODERATE (A) 10/07/2019 0957   BILIRUBINUR NEGATIVE 10/07/2019 0957   KETONESUR NEGATIVE 10/07/2019 0957   PROTEINUR 30 (A) 10/07/2019 0957   NITRITE NEGATIVE 10/07/2019 0957   LEUKOCYTESUR TRACE (A) 10/07/2019 0957   Sepsis Labs: @LABRCNTIP(procalcitonin:4,lacticidven:4) ) Recent Results (from the past 240 hour(s))  Respiratory Panel by RT PCR (Flu A&B, Covid) - Nasopharyngeal Swab     Status: None   Collection Time: 10/02/19  3:03 PM   Specimen: Nasopharyngeal Swab  Result Value Ref Range Status   SARS Coronavirus 2 by RT PCR NEGATIVE NEGATIVE Final    Comment: (NOTE) SARS-CoV-2 target nucleic acids are NOT DETECTED. The SARS-CoV-2 RNA is generally detectable in upper respiratoy specimens   during the acute phase of infection. The lowest concentration of SARS-CoV-2 viral copies this assay can detect is 131 copies/mL. A negative  result does not preclude SARS-Cov-2 infection and should not be used as the sole basis for treatment or other patient management decisions. A negative result may occur with  improper specimen collection/handling, submission of specimen other than nasopharyngeal swab, presence of viral mutation(s) within the areas targeted by this assay, and inadequate number of viral copies (<131 copies/mL). A negative result must be combined with clinical observations, patient history, and epidemiological information. The expected result is Negative. Fact Sheet for Patients:  https://www.fda.gov/media/142436/download Fact Sheet for Healthcare Providers:  https://www.fda.gov/media/142435/download This test is not yet ap proved or cleared by the [place] FDA and  has been authorized for detection and/or diagnosis of SARS-CoV-2 by FDA under an Emergency Use Authorization (EUA). This EUA will remain  in effect (meaning this test can be used) for the duration of the COVID-19 declaration under Section 564(b)(1) of the Act, 21 U.S.C. section 360bbb-3(b)(1), unless the authorization is terminated or revoked sooner.    Influenza A by PCR NEGATIVE NEGATIVE Final   Influenza B by PCR NEGATIVE NEGATIVE Final    Comment: (NOTE) The Xpert Xpress SARS-CoV-2/FLU/RSV assay is intended as an aid in  the diagnosis of influenza from Nasopharyngeal swab specimens and  should not be used as a sole basis for treatment. Nasal washings and  aspirates are unacceptable for Xpert Xpress SARS-CoV-2/FLU/RSV  testing. Fact Sheet for Patients: https://www.fda.gov/media/142436/download Fact Sheet for Healthcare Providers: https://www.fda.gov/media/142435/download This test is not yet approved or cleared by the [place] FDA and  has been authorized for detection and/or diagnosis of SARS-CoV-2 by  FDA under an Emergency Use Authorization (EUA). This EUA will remain  in effect (meaning this test can be used) for the  duration of the  Covid-19 declaration under Section 564(b)(1) of the Act, 21  U.S.C. section 360bbb-3(b)(1), unless the authorization is  terminated or revoked. Performed at [facility], [address] [place], [place] [address]   Culture, blood (routine x 2)     Status: None (Preliminary result)   Collection Time: 10/05/19  6:35 PM   Specimen: BLOOD  Result Value Ref Range Status   Specimen Description BLOOD RIGHT ANTECUBITAL  Final   Special Requests   Final    BOTTLES DRAWN AEROBIC AND ANAEROBIC Blood Culture adequate volume   Culture NO GROWTH 3 DAYS  Final   Report Status PENDING  Incomplete  Culture, blood (routine x 2)     Status: None (Preliminary result)   Collection Time: 10/05/19  6:43 PM   Specimen: BLOOD LEFT HAND  Result Value Ref Range Status   Specimen Description BLOOD LEFT HAND  Final   Special Requests   Final    BOTTLES DRAWN AEROBIC ONLY Blood Culture adequate volume   Culture NO GROWTH 3 DAYS  Final   Report Status PENDING  Incomplete  Culture, Urine     Status: None   Collection Time: 10/07/19  9:55 AM   Specimen: Urine, Random  Result Value Ref Range Status   Specimen Description URINE, RANDOM  Final   Special Requests NONE  Final   Culture   Final    NO GROWTH Performed at [facility], [address] [place], [place] [address]    Report Status 10/08/2019 FINAL  Final         Radiology Studies: MR BRAIN WO CONTRAST  Result Date: 10/06/2019 CLINICAL   DATA:  74-year-old male with solitary kidney and chronic renal disease. Altered mental status. Multiple myeloma. Abnormally decreased PET activity in the right frontal lobe on PET-CT earlier this month, thought to be right MCA territory encephalomalacia on subsequent noncontrast head CT. EXAM: MRI HEAD WITHOUT CONTRAST TECHNIQUE: Multiplanar, multiecho pulse sequences of the brain and surrounding structures were obtained without intravenous contrast. COMPARISON:  Head CT 10/02/2019 and  earlier. FINDINGS: Study is intermittently degraded by motion artifact despite repeated imaging attempts. Brain: No restricted diffusion or evidence of acute infarction. Confirmed right frontal lobe middle and superior frontal gyri encephalomalacia (series 18, image 17). There is additional nearby right parietal lobe encephalomalacia. Mild associated hemosiderin. There is also patchy encephalomalacia in the right occipital lobe. Scattered additional cerebral white matter T2 and FLAIR hyperintensity. No midline shift, mass effect, evidence of mass lesion, ventriculomegaly, extra-axial collection or acute intracranial hemorrhage. Cervicomedullary junction and pituitary are within normal limits. Possible chronic microhemorrhage in the left cerebellum on series 14, image 15. Vascular: Major intracranial vascular flow voids are preserved. Skull and upper cervical spine: Cervical spine detail degraded by motion, but there is abnormal DWI signal in the visible cervical vertebrae. No destructive calvarium lesion identified. Sinuses/Orbits: Negative orbits. Paranasal Visualized paranasal sinuses and mastoids are stable and well pneumatized. Other: Visible internal auditory structures appear normal. Scalp and face soft tissues appear negative. IMPRESSION: 1. No acute intracranial abnormality. 2. Chronic right MCA territory ischemia and encephalomalacia. Mild similar changes in the right PCA territory. 3. Motion degraded exam, with abnormal signal in the cervical spine suspicious for involvement by multiple myeloma. Electronically Signed   By: H  Hall M.D.   On: 10/06/2019 18:23   DG Chest Port 1 View  Result Date: 10/07/2019 CLINICAL DATA:  Fever. EXAM: PORTABLE CHEST 1 VIEW COMPARISON:  October 02, 2019. FINDINGS: Stable cardiomegaly. No pneumothorax is noted. Left lung is clear. New right basilar opacity is noted concerning for pneumonia. No significant pleural effusion is noted. Bony thorax is unremarkable. IMPRESSION:  New right basilar opacity is noted concerning for pneumonia. Electronically Signed   By: James  Green Jr M.D.   On: 10/07/2019 10:25      Scheduled Meds: . acetaminophen  325 mg Oral Once  . chlorhexidine  15 mL Mouth Rinse BID  . Chlorhexidine Gluconate Cloth  6 each Topical Daily  . feeding supplement (NEPRO CARB STEADY)  237 mL Oral TID BM  . insulin aspart  0-6 Units Subcutaneous TID WC  . mouth rinse  15 mL Mouth Rinse q12n4p  . tamsulosin  0.4 mg Oral QPC supper   Continuous Infusions:    LOS: 6 days      Saima Rizwan, MD [facility] Pager: [email or web address] Password [email or web address] 10/08/2019, 1:35 PM  

## 2019-10-08 NOTE — Progress Notes (Signed)
[facility] Progress Note    Assessment/ Plan:   # Acute kidney injury:  Multifactorial - recent prerenal insults and also in setting of multiple myeloma and solitary kidney with reported retention and mild pelvicaliectasis on ultrasound.  Some improvement with holding torsemide, BUN still high with decreasing Cr. Since he isn't a chemo candidate, dialysis will not add to QOL and will not be offered.  Regardless, he appears quite frail and would likely not do well.  Palliative has been consulted.  Have cancelled [facility] and HD orders.    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 Irene Limbo, no chemo  # Nonischemic cardiomyopathy: can use diuretic prn for respiratory symptoms; however this will likely drive up BUN and contribute to azotemia/ uremia  # HTN with CKD  - acceptable control - avoid hypotension   # Dispo: greatly appreciate palliative care involvement.  Comfort/ hospice is appropriate at any time.  Subjective:    Not a chemo candidate for MM.  Cr is actually improving, has uremic symptoms however.  Discussed with primary.  As he is not a chemo candidate and is exceedingly frail, will not offer dialysis.     Objective:   BP 106/73 (BP Location: Left Arm)   Pulse 75   Temp 98.5 F (36.9 C) (Axillary)   Resp (!) 24   Wt 79.1 kg   SpO2 97%   BMI 24.32 kg/m   Intake/Output Summary (Last 24 hours) at 10/08/2019 0850 Last data filed at 10/08/2019 0436 Gross per 24 hour  Intake 330 ml  Output 2450 ml  Net -2120 ml   Weight change:   Physical Exam: Gen: appears ill HEENT: temporal wasting, scab L nares from ? Feeding tube CVS: RRR Resp: normal WOB, clear anteriorly Abd: soft GU: Foley with pale yellow urine, some sediment Ext: no RLE edema, + 1 LLE edema  Imaging: MR BRAIN WO CONTRAST  Result Date:  10/06/2019 CLINICAL DATA:  74 year old male with solitary kidney and chronic renal disease. Altered mental status. Multiple myeloma. Abnormally decreased PET activity in the right frontal lobe on PET-CT earlier this month, thought to be right MCA territory encephalomalacia on subsequent noncontrast head CT. EXAM: MRI HEAD WITHOUT CONTRAST TECHNIQUE: Multiplanar, multiecho pulse sequences of the brain and surrounding structures were obtained without intravenous contrast. COMPARISON:  Head CT 10/02/2019 and earlier. FINDINGS: Study is intermittently degraded by motion artifact despite repeated imaging attempts. Brain: No restricted diffusion or evidence of acute infarction. Confirmed right frontal lobe middle and superior frontal gyri encephalomalacia (series 18, image 17). There is additional nearby right parietal lobe encephalomalacia. Mild associated hemosiderin. There is also patchy encephalomalacia in the right occipital lobe. Scattered additional cerebral white matter T2 and FLAIR hyperintensity. No midline shift, mass effect, evidence of mass lesion, ventriculomegaly, extra-axial collection or acute intracranial hemorrhage. Cervicomedullary junction and pituitary are within normal limits. Possible chronic microhemorrhage in the left cerebellum on series 14, image 15. Vascular: Major intracranial vascular flow voids are preserved. Skull and upper cervical spine: Cervical spine detail degraded by motion, but there is abnormal DWI signal in the visible cervical vertebrae. No destructive calvarium lesion identified. Sinuses/Orbits: Negative orbits. Paranasal Visualized paranasal sinuses and mastoids are stable and well pneumatized. Other: Visible internal auditory structures appear normal. Scalp and face soft tissues appear negative. IMPRESSION: 1. No acute intracranial abnormality. 2. Chronic right MCA territory  ischemia and encephalomalacia. Mild similar changes in the right PCA territory. 3. Motion degraded  exam, with abnormal signal in the cervical spine suspicious for involvement by multiple myeloma. Electronically Signed   By: Genevie [name] M.D.   On: 10/06/2019 18:23   DG Chest Port 1 View  Result Date: 10/07/2019 CLINICAL DATA:  Fever. EXAM: PORTABLE CHEST 1 VIEW COMPARISON:  October 02, 2019. FINDINGS: Stable cardiomegaly. No pneumothorax is noted. Left lung is clear. New right basilar opacity is noted concerning for pneumonia. No significant pleural effusion is noted. Bony thorax is unremarkable. IMPRESSION: New right basilar opacity is noted concerning for pneumonia. Electronically Signed   By: Marijo Conception M.D.   On: 10/07/2019 10:25    Labs: BMET Recent Labs  Lab 10/05/19 0340 10/05/19 0619 10/05/19 0949 10/05/19 1435 10/06/19 0424 10/07/19 0454 10/08/19 0458  NA 152* 151* 152* 149* 149* 146* 149*  K 4.5 3.7 3.7 3.8 3.9 4.3 3.8  CL 118* 118* 119* 114* 110 110 112*  CO2 20* 19* 20* 19* 23 20* 22  GLUCOSE 216* 200* 224* 215* 228* 216* 237*  BUN 131* 127* 130* 134* 138* 143* 148*  CREATININE 4.86* 4.84* 5.09* 5.36* 5.69* 5.45* 4.54*  CALCIUM 9.1 8.9 8.8* 8.8* 8.8* 8.5* 8.6*  PHOS  --   --   --   --  4.6 4.9* 4.5   CBC Recent Labs  Lab 10/02/19 1440 10/03/19 0403 10/05/19 0340 10/05/19 0340 10/06/19 0424 10/07/19 0454 10/07/19 1710 10/08/19 0458  WBC 8.8   < > 13.2*  --  12.1* 12.4*  --  9.9  NEUTROABS 7.3  --   --   --   --   --   --   --   HGB 9.4*   < > 8.5*   < > 7.9* 6.7* 8.1* 7.6*  HCT 31.1*   < > 27.7*   < > 24.9* 21.3* 24.1* 23.5*  MCV 104.0*   < > 104.9*  --  98.8 100.5*  --  98.3  PLT 76*   < > 62*  --  53* 45*  --  39*   < > = values in this interval not displayed.    Medications:    . acetaminophen  325 mg Oral Once  . chlorhexidine  15 mL Mouth Rinse BID  . Chlorhexidine Gluconate Cloth  6 each Topical Daily  . feeding supplement (NEPRO CARB STEADY)  237 mL Oral TID BM  . insulin aspart  0-6 Units Subcutaneous TID WC  . mouth rinse  15 mL Mouth Rinse  q12n4p  . tamsulosin  0.4 mg Oral QPC supper      Madelon Lips, MD 10/08/2019, 8:50 AM

## 2019-10-09 DIAGNOSIS — R531 Weakness: Secondary | ICD-10-CM

## 2019-10-09 DIAGNOSIS — N179 Acute kidney failure, unspecified: Secondary | ICD-10-CM

## 2019-10-09 DIAGNOSIS — Z515 Encounter for palliative care: Secondary | ICD-10-CM

## 2019-10-09 DIAGNOSIS — Z66 Do not resuscitate: Secondary | ICD-10-CM

## 2019-10-09 DIAGNOSIS — R627 Adult failure to thrive: Secondary | ICD-10-CM

## 2019-10-09 DIAGNOSIS — N189 Chronic kidney disease, unspecified: Secondary | ICD-10-CM

## 2019-10-09 DIAGNOSIS — R4182 Altered mental status, unspecified: Secondary | ICD-10-CM

## 2019-10-09 LAB — BASIC METABOLIC PANEL
Anion gap: 15 (ref 5–15)
BUN: 139 mg/dL — ABNORMAL HIGH (ref 8–23)
CO2: 23 mmol/L (ref 22–32)
Calcium: 8.8 mg/dL — ABNORMAL LOW (ref 8.9–10.3)
Chloride: 119 mmol/L — ABNORMAL HIGH (ref 98–111)
Creatinine, Ser: 4.11 mg/dL — ABNORMAL HIGH (ref 0.61–1.24)
GFR calc Af Amer: 15 mL/min — ABNORMAL LOW (ref >60)
GFR calc non Af Amer: 13 mL/min — ABNORMAL LOW (ref >60)
Glucose, Bld: 230 mg/dL — ABNORMAL HIGH (ref 70–99)
Potassium: 3.6 mmol/L (ref 3.5–5.1)
Sodium: 157 mmol/L — ABNORMAL HIGH (ref 135–145)

## 2019-10-09 LAB — COMPREHENSIVE METABOLIC PANEL
ALT: 24 U/L (ref 0–44)
AST: 20 U/L (ref 15–41)
Albumin: 1.5 g/dL — ABNORMAL LOW (ref 3.5–5.0)
Alkaline Phosphatase: 96 U/L (ref 38–126)
Anion gap: 15 (ref 5–15)
BUN: 143 mg/dL — ABNORMAL HIGH (ref 8–23)
CO2: 23 mmol/L (ref 22–32)
Calcium: 8.7 mg/dL — ABNORMAL LOW (ref 8.9–10.3)
Chloride: 119 mmol/L — ABNORMAL HIGH (ref 98–111)
Creatinine, Ser: 4.16 mg/dL — ABNORMAL HIGH (ref 0.61–1.24)
GFR calc Af Amer: 15 mL/min — ABNORMAL LOW (ref >60)
GFR calc non Af Amer: 13 mL/min — ABNORMAL LOW (ref >60)
Glucose, Bld: 245 mg/dL — ABNORMAL HIGH (ref 70–99)
Potassium: 3.4 mmol/L — ABNORMAL LOW (ref 3.5–5.1)
Sodium: 157 mmol/L — ABNORMAL HIGH (ref 135–145)
Total Bilirubin: 1 mg/dL (ref 0.3–1.2)
Total Protein: 7.6 g/dL (ref 6.5–8.1)

## 2019-10-09 LAB — CBC
HCT: 24 % — ABNORMAL LOW (ref 39.0–52.0)
Hemoglobin: 7.7 g/dL — ABNORMAL LOW (ref 13.0–17.0)
MCH: 31.8 pg (ref 26.0–34.0)
MCHC: 32.1 g/dL (ref 30.0–36.0)
MCV: 99.2 fL (ref 80.0–100.0)
Platelets: 44 10*3/uL — ABNORMAL LOW (ref 150–400)
RBC: 2.42 MIL/uL — ABNORMAL LOW (ref 4.22–5.81)
RDW: 14.7 % (ref 11.5–15.5)
WBC: 9.2 10*3/uL (ref 4.0–10.5)
nRBC: 0 % (ref 0.0–0.2)

## 2019-10-09 LAB — GLUCOSE, CAPILLARY
Glucose-Capillary: 197 mg/dL — ABNORMAL HIGH (ref 70–99)
Glucose-Capillary: 221 mg/dL — ABNORMAL HIGH (ref 70–99)
Glucose-Capillary: 225 mg/dL — ABNORMAL HIGH (ref 70–99)

## 2019-10-09 LAB — PROTIME-INR
INR: 1.4 — ABNORMAL HIGH (ref 0.8–1.2)
Prothrombin Time: 17.3 seconds — ABNORMAL HIGH (ref 11.4–15.2)

## 2019-10-09 LAB — HAPTOGLOBIN: Haptoglobin: 397 mg/dL — ABNORMAL HIGH (ref 34–355)

## 2019-10-09 LAB — PHOSPHORUS: Phosphorus: 4.4 mg/dL (ref 2.5–4.6)

## 2019-10-09 MED ORDER — GLYCOPYRROLATE 0.2 MG/ML IJ SOLN
0.4000 mg | Freq: Three times a day (TID) | INTRAMUSCULAR | Status: DC
  Administered 2019-10-09 – 2019-10-10 (×3): 0.4 mg via INTRAVENOUS
  Filled 2019-10-09 (×3): qty 2

## 2019-10-09 MED ORDER — LORAZEPAM 2 MG/ML IJ SOLN
1.0000 mg | INTRAMUSCULAR | Status: DC | PRN
  Administered 2019-10-09: 19:00:00 1 mg via INTRAVENOUS
  Filled 2019-10-09 (×2): qty 1

## 2019-10-09 MED ORDER — SODIUM CHLORIDE 0.9 % IV BOLUS
500.0000 mL | Freq: Once | INTRAVENOUS | Status: AC
  Administered 2019-10-09: 12:00:00 500 mL via INTRAVENOUS

## 2019-10-09 MED ORDER — MORPHINE SULFATE (PF) 2 MG/ML IV SOLN
1.0000 mg | INTRAVENOUS | Status: DC | PRN
  Filled 2019-10-09: qty 1

## 2019-10-09 NOTE — Progress Notes (Signed)
Patient ID: Mark Baird., male   DOB: April 13, 1946, 74 y.o.   MRN: [id number]  This NP visited patient at the bedside as a follow up to  yesterday's Maurice and to meet with patient's cousin and main support person and decision maker for continued conversation regarding current medical situation, treatment option decisions, advanced directive decisions and anticipatory care needs.  Although Mr. Mark Baird is not documented as healthcare power of attorney he presents paperwork documenting [place].  Mr. Mark Baird does not have any other family and Mr. Mark Baird is the individual with an established relationship with the patient acting in good faith and reliably conveying the wishes of the patient.  Plan of care Focus of care is comfort, quality and dignity allowing for a natural death -DNR/DNI -No artificial feeding or hydration now or in the future, comfort feeds as tolerated with known risk of aspiration -No further IV antibiotic use -No further diagnostics, lab drawls -Symptom management to enhance comfort -Residential hospice for end-of-life care, family is requesting [facility] -Prognosis is days to weeks  Discussed with Mr. Mark Baird the natural trajectory and expectations at end of life.  Questions and concerns addressed   Discussed with Dr Wynelle Cleveland via secure chat  Total time spent on the unit was 35 minutes  Greater than 50% of the time was spent in counseling and coordination of care  Wadie Lessen NP  Palliative Medicine Team Team Phone # [phone] Pager [phone]

## 2019-10-09 NOTE — Progress Notes (Signed)
  Speech Language Pathology Treatment: Dysphagia  Patient Details Name: Mark Baird. MRN: [id number] DOB: 1946-06-11 Today's Date: 10/09/2019 Time: 6016-5800 SLP Time Calculation (min) (ACUTE ONLY): 13 min  Assessment / Plan / Recommendation Clinical Impression  Per RN, pt and his cousin just met with Palliative medicine and decision was made to transition to comfort care.  Pt seen briefly with cousin present.  Pt has been asking for something to drink.  He was repositioned and right mitt was removed; he was able to hold the cup and bring to lips with support.  Continues to present with spillage from mouth and immediate coughing with water.  He said he would love a Coke and was able to take small sips with improved oral seal and no coughing post-swallow. Recommend allowing pt to eat/drink per his preferences.  He may be more comfortable when positioned in upright position and will likely swallow more efficiently when able to hold cup/utensils himself.  D/W pt's cousin.  No further acute care SLP is warranted.  Our service will respectfully sign off.    HPI HPI: 74 yo male admitted 10/02/19 with AMS and severe dehydration.Marland Kitchen PMH: solitary kidney, CKD3, DVT, multiple myeloma, encephalomalacia, R MCA infarct, nonischemic cardiomyopathy, HTN. CXR = minimal atelectasis or infiltrate at the left lung base, HeadCT no acute abnormality      SLP Plan  Discharge SLP treatment due to (comment) - pt is transitioning to comfort care.      Recommendations  Diet recommendations: soft solids;Thin liquid Liquids provided via: Cup;Teaspoon Medication Administration: Crushed with puree Supervision: Staff to assist with self feeding Compensations: Slow rate;Small sips/bites                Oral Care Recommendations: Oral care BID Follow up Recommendations: 24 hour supervision/assistance SLP Visit Diagnosis: Dysphagia, oropharyngeal phase (R13.12) Plan: Discharge SLP treatment due to (comment)comfort  care       GO                Mark Baird 10/09/2019, 12:53 PM  Estill Bamberg L. Tivis Ringer, [place] Office number [phone]

## 2019-10-09 NOTE — Progress Notes (Signed)
Mark Baird Progress Note    Assessment/ Plan:   # Acute kidney injury:  Multifactorial - recent prerenal insults and also in setting of multiple myeloma and solitary kidney with reported retention and mild pelvicaliectasis on ultrasound.  Some improvement with holding torsemide, BUN still high with decreasing Cr. Since he isn't a chemo candidate, dialysis will not add to QOL and will not be offered.  Regardless, he appears quite frail and would likely not do well.  Palliative has been consulted--> appreciate assistance.  His BUN and Cr are trending down which is excellent.  Will continue IVFs as below  # Hypernatremia: back up to 157 today with increased UOP, have increased D5, will also give a small bolus of isotonic IVFs as well.    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 Irene Limbo, no chemo  # Nonischemic cardiomyopathy: can use diuretic prn for respiratory symptoms; however this will likely drive up BUN and contribute to azotemia/ uremia  # HTN with CKD  - acceptable control - avoid hypotension   # RLL infiltrate: antibiotics broadened to azactam and flagyl  # Thrombocytopenia: plts up a little to 44 this AM  # Dispo: greatly appreciate palliative care involvement.  Comfort/ hospice is appropriate at any time.  Subjective:    Cr and BUN down, Na back up to 157.  Antibiotics broaded to treat pneumonia   Objective:   BP (!) 101/56 (BP Location: Right Arm)   Pulse 86   Temp 97.8 F (36.6 C) (Oral)   Resp 19   Wt 79.1 kg   SpO2 98%   BMI 24.32 kg/m   Intake/Output Summary (Last 24 hours) at 10/09/2019 1129 Last data filed at 10/09/2019 0500 Gross per 24 hour  Intake 919.95 ml  Output 2050 ml  Net -1130.05 ml   Weight change:   Physical Exam: Gen: appears ill HEENT: temporal wasting, scab L nares from ? Feeding tube CVS: RRR Resp: normal WOB, clear anteriorly Abd:  soft GU: Foley with pale yellow urine, some sediment Ext: no RLE edema, + 1 LLE edema  Imaging: No results found.  Labs: BMET Recent Labs  Lab 10/05/19 0949 10/05/19 1435 10/06/19 0424 10/07/19 0454 10/08/19 0458 10/09/19 0307 10/09/19 0849  NA 152* 149* 149* 146* 149* 157* 157*  K 3.7 3.8 3.9 4.3 3.8 3.4* 3.6  CL 119* 114* 110 110 112* 119* 119*  CO2 20* 19* 23 20* 22 23 23   GLUCOSE 224* 215* 228* 216* 237* 245* 230*  BUN 130* 134* 138* 143* 148* 143* 139*  CREATININE 5.09* 5.36* 5.69* 5.45* 4.54* 4.16* 4.11*  CALCIUM 8.8* 8.8* 8.8* 8.5* 8.6* 8.7* 8.8*  PHOS  --   --  4.6 4.9* 4.5 4.4  --    CBC Recent Labs  Lab 10/02/19 1440 10/03/19 0403 10/06/19 0424 10/06/19 0424 10/07/19 0454 10/07/19 1710 10/08/19 0458 10/09/19 0307  WBC 8.8   < > 12.1*  --  12.4*  --  9.9 9.2  NEUTROABS 7.3  --   --   --   --   --   --   --   HGB 9.4*   < > 7.9*   < > 6.7* 8.1* 7.6* 7.7*  HCT 31.1*   < > 24.9*   < > 21.3* 24.1* 23.5* 24.0*  MCV 104.0*   < > 98.8  --  100.5*  --  98.3 99.2  PLT 76*   < >  53*  --  45*  --  39* 44*   < > = values in this interval not displayed.    Medications:    . acetaminophen  325 mg Oral Once  . chlorhexidine  15 mL Mouth Rinse BID  . Chlorhexidine Gluconate Cloth  6 each Topical Daily  . feeding supplement (NEPRO CARB STEADY)  237 mL Oral TID BM  . insulin aspart  0-9 Units Subcutaneous Q4H  . mouth rinse  15 mL Mouth Rinse q12n4p  . tamsulosin  0.4 mg Oral QPC supper      Mark Lips, MD 10/09/2019, 11:29 AM

## 2019-10-09 NOTE — Progress Notes (Signed)
Nutrition Brief Note  Chart reviewed. Pt now transitioning to comfort care.  No further nutrition interventions warranted at this time.  Please re-consult as needed.   Tamila Gaulin RD, LDN Clinical Nutrition Pager # - [phone]    

## 2019-10-09 NOTE — Progress Notes (Signed)
PROGRESS NOTE    Mark Baird.   RCV:[id number]  DOB: [birth year]  DOA: 10/02/2019 PCP: Sandi Mariscal, MD   Brief Narrative:  Mark Baird. 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   He was found laying on the couch by his neighbor who called 911. He was covered in urine and feces. Last known normal a couple of days ago. EMS reported full body tremors in route and patient was given Versed.No history could be obtained from the patient due to his altered mental status. In ED>tachycardia and tachypnea noted His Scr was elevated to 4.6 from baseline of 2. Sodium level was elevated at 164.  COVID-19 and influenza negative.     Subjective:   No complaints.     Assessment & Plan:   Principal Problem:   Hypernatremia  -Hypernatremia related to dehydration-sodium was 162 when admitted improved to 146 with D5 water - D 5 stopped a couple of days  by renal team but resumed yesterday- sodium has actually risen to 157 although Cr improving- cont D5W for now  Active Problems: AKI on CKD 3 with metabolic acidosis - history of solitary left kidney - Creatinine was 1.67 on 01/04/2019- 2.06 and 08/06/2019- 4.67 on 10/02/2019 has steadily rose to 5.45 - ? If this was ATN - Foley catheter placed in ED and Flomax started  - on Demadex at home which has been stopped - Renal ultrasound unremarkable-  - May be related to multiple myeloma? Nephrology and Onc consulted- Dr Irene Limbo states he is not a candidate for chemo - nephrology does not feel he is a candidate for dialysis either- cont to follow Cr and treat conservatively  - he continues to make a good amount of urine daily - Cr now improving and is 4.11 today   Toxic metabolic encephalopathy- h/o  CVA - h/o large frontal CVA in the past - he was found lethargic at home -EEG and CT head done in ED were unrevealing -Ammonia level was normal - this is possibly related to uremia due to AKI and also hypernatremia - he is alert but remains quite confused and is not able to eat yet   Suspected pneumonia -Chest x-ray showed left lower lobe infiltrate versus atelectasis and he was started on levofloxacin -He continued to have a low-grade fever despite Levaquin -Blood cultures have been negative - 1/31> temp 100.10-  I ordered a UA and urine culture - UA showed only few bacteria and WBCs- chest x-ray reveals a right basilar opacity concerning for pneumonia -? Aspiration - d/c'd Levaquin as he was having fever despite this - he is allergic to PCN which causes severe swelling and rash - 2/2> - started Azactam and Flagyl for RLL infiltrate - fevers resolved  Acute anemia and thrombocytopenia  -With IV fluids being given, hemoglobin has steadily drifted down from 9 to 6.7  - transfused 1 unit of packed red blood cells on 2/1 -Anemia panel checked on 10/05/2019 revealed an elevated ferritin at 613 and low iron levels - Platelets now 39-d/c heparin and reversed Coumadin with 2 mg Vit K to prevent bleeding - ? If drop in Hb and platelets are related to Multiple myeloma- as mentioned, Dr Irene Limbo recommends palliative care/hospice - 2/3 Hb?>> stable and platelets rising now  Hyperglycemia - 2/2> start SSI-  sugars in 200s- last A1c was 6.3- no formal diagnosis of  DM on the chart  Chronic combined systolic and diastolic heart failure -Last echo was in 5/20 and revealed an EF of 20% with grade 1 diastolic heart failure -Diuretics are on hold due to severe dehydration related to them and poor oral intake  Left lower extremity DVT The patient is on Coumadin but INR was subtherapeutic at 1.6 when he presented 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 The patient was receiving heparin which was  discontinued as INR was therapeutic - Coumadin on hold as mentioned above due to thrombocytopenia  Left knee pain-  Hardware due to a prior left patella repair is nearly puncturing his skin - ortho eval on 1/28 > painful to movement- no infection suspected by ortho but recommended that hardware be removed once more stable  Time spent in minutes: 45 minutes DVT prophylaxis: Coumadin Code Status:  - now DNR after palliative care RN, Wadie Lessen spoke with cousin on 2/2 Family Communication:  Disposition Plan: He remains quite ill - ongoing AKI, hypernatremia, anemia, thrombocytopenia and altered mental status- prognosis guarded - giving IVF and Abx- if he does not recuperate and start eating, he will be transitioned to comfort care  Consultants:   Renal consulted  Phone consult with oncology, Dr Irene Limbo  Orthopedic surgery Procedures:   EEG Antimicrobials:  Anti-infectives (From admission, onward)   Start     Dose/Rate Route Frequency Ordered Stop   10/08/19 1415  aztreonam (AZACTAM) 0.5 g in dextrose 5 % 50 mL IVPB     0.5 g 100 mL/hr over 30 Minutes Intravenous Every 8 hours 10/08/19 1414     10/08/19 1400  metroNIDAZOLE (FLAGYL) IVPB 500 mg     500 mg 100 mL/hr over 60 Minutes Intravenous Every 8 hours 10/08/19 1346     10/05/19 0800  levofloxacin (LEVAQUIN) IVPB 500 mg  Status:  Discontinued    Note to Pharmacy: Levaquin 500 mg IV q48h for CrCL < 20 mL/min   500 mg 100 mL/hr over 60 Minutes Intravenous Every 48 hours 10/03/19 0154 10/07/19 1411   10/03/19 0300  levofloxacin (LEVAQUIN) IVPB 750 mg     750 mg 100 mL/hr over 90 Minutes Intravenous  Once 10/03/19 0154 10/03/19 0457       Objective: Vitals:   10/08/19 2100 10/09/19 0000 10/09/19 0400 10/09/19 0823  BP: 129/62 (!) 133/56 111/72 (!) 101/56  Pulse: 96 98 95 86  Resp: (!) 22 (!) _0 Temp:  98.3 F (36.8 C) 98.1 F (36.7 C) 97.8 F (36.6 C)  TempSrc:  Oral Oral Oral  SpO2: 98% 100% 100% 98%  Weight:         Intake/Output Summary (Last 24 hours) at 10/09/2019 1002 Last data filed at 10/09/2019 0500 Gross per 24 hour  Intake 919.95 ml  Output 2050 ml  Net -1130.05 ml   Filed Weights   10/05/19 0428 10/06/19 0311 10/08/19 0430  Weight: 80.1 kg 82 kg 79.1 kg    Examination: General exam: Appears comfortable -  HEENT: PERRLA, oral mucosa moist, no sclera icterus or thrush Respiratory system: poor breath sounds Cardiovascular system: S1 & S2 heard,  No murmurs  Gastrointestinal system: Abdomen soft, non-tender, nondistended. Normal bowel sounds   Central nervous system: Alert and oriented only to person. No focal neurological deficits. Extremities: No cyanosis, clubbing or edema Skin: No rashes or ulcers Psychiatry:  flat affect.     Data Reviewed: I  have personally reviewed following labs and imaging studies  CBC: Recent Labs  Lab 10/02/19 1440 10/03/19 0403 10/05/19 0340 10/05/19 0949 10/06/19 0424 10/07/19 0454 10/07/19 1710 10/08/19 0458 10/09/19 0307  WBC 8.8   < > 13.2*  --  12.1* 12.4*  --  9.9 9.2  NEUTROABS 7.3  --   --   --   --   --   --   --   --   HGB 9.4*   < > 8.5*  --  7.9* 6.7* 8.1* 7.6* 7.7*  HCT 31.1*   < > 27.7*   < > 24.9* 21.3* 24.1* 23.5* 24.0*  MCV 104.0*   < > 104.9*  --  98.8 100.5*  --  98.3 99.2  PLT 76*   < > 62*  --  53* 45*  --  39* 44*   < > = values in this interval not displayed.   Basic Metabolic Panel: Recent Labs  Lab 10/06/19 0424 10/07/19 0454 10/08/19 0458 10/09/19 0307 10/09/19 0849  NA 149* 146* 149* 157* 157*  K 3.9 4.3 3.8 3.4* 3.6  CL 110 110 112* 119* 119*  CO2 23 20* _0 GLUCOSE 228* 216* 237* 245* 230*  BUN 138* 143* 148* 143* 139*  CREATININE 5.69* 5.45* 4.54* 4.16* 4.11*  CALCIUM 8.8* 8.5* 8.6* 8.7* 8.8*  MG 2.6*  --   --   --   --   PHOS 4.6 4.9* 4.5 4.4  --    GFR: Estimated Creatinine Clearance: 16.8 mL/min (A) (by C-G formula based on SCr of 4.11 mg/dL (H)). Liver Function Tests: Recent Labs    Lab 10/02/19 1440 10/02/19 1440 10/06/19 0424 10/06/19 0917 10/07/19 0454 10/08/19 0458 10/09/19 0307  AST 30  --   --  24  --   --  20  ALT 43  --   --  23  --   --  24  ALKPHOS 96  --   --  74  --   --  96  BILITOT 1.5*  --   --  2.6*  --   --  1.0  PROT 10.5*  --   --  7.8  --   --  7.6  ALBUMIN 2.6*   < > 1.5* 1.5* 1.4* 1.5* 1.5*   < > = values in this interval not displayed.   No results for input(s): LIPASE, AMYLASE in the last 168 hours. Recent Labs  Lab 10/02/19 2107  AMMONIA 11   Coagulation Profile: Recent Labs  Lab 10/06/19 0424 10/06/19 1847 10/07/19 0454 10/08/19 0458 10/09/19 0307  INR 2.3* 2.3* 2.1* 1.6* 1.4*   Cardiac Enzymes: Recent Labs  Lab 10/02/19 1440 10/04/19 1125  CKTOTAL 470* 222   BNP (last 3 results) No results for input(s): PROBNP in the last 8760 hours. HbA1C: No results for input(s): HGBA1C in the last 72 hours. CBG: Recent Labs  Lab 10/08/19 1559 10/08/19 2029 10/09/19 0048 10/09/19 0433 10/09/19 0742  GLUCAP 201* 188* 225* 221* 197*   Lipid Profile: No results for input(s): CHOL, HDL, LDLCALC, TRIG, CHOLHDL, LDLDIRECT in the last 72 hours. Thyroid Function Tests: No results for input(s): TSH, T4TOTAL, FREET4, T3FREE, THYROIDAB in the last 72 hours. Anemia Panel: Recent Labs    10/08/19 1001  VITAMINB12 >7,500*  FOLATE 7.0  FERRITIN 864*  TIBC 125*  IRON 28*  RETICCTPCT 1.1   Urine analysis:    Component Value Date/Time   COLORURINE YELLOW 10/07/2019 0957   APPEARANCEUR  CLOUDY (A) 10/07/2019 0957   LABSPEC 1.012 10/07/2019 0957   PHURINE 5.0 10/07/2019 0957   GLUCOSEU NEGATIVE 10/07/2019 0957   HGBUR MODERATE (A) 10/07/2019 0957   BILIRUBINUR NEGATIVE 10/07/2019 0957   KETONESUR NEGATIVE 10/07/2019 0957   PROTEINUR 30 (A) 10/07/2019 0957   NITRITE NEGATIVE 10/07/2019 0957   LEUKOCYTESUR TRACE (A) 10/07/2019 0957   Sepsis Labs: _0 (procalcitonin:4,lacticidven:4) ) Recent Results (from the past  240 hour(s))  Respiratory Panel by RT PCR (Flu A&B, Covid) - Nasopharyngeal Swab     Status: None   Collection Time: 10/02/19  3:03 PM   Specimen: Nasopharyngeal Swab  Result Value Ref Range Status   SARS Coronavirus 2 by RT PCR NEGATIVE NEGATIVE Final    Comment: (NOTE) SARS-CoV-2 target nucleic acids are NOT DETECTED. The SARS-CoV-2 RNA is generally detectable in upper respiratoy specimens during the acute phase of infection. The lowest concentration of SARS-CoV-2 viral copies this assay can detect is 131 copies/mL. A negative result does not preclude SARS-Cov-2 infection and should not be used as the sole basis for treatment or other patient management decisions. A negative result may occur with  improper specimen collection/handling, submission of specimen other than nasopharyngeal swab, presence of viral mutation(s) within the areas targeted by this assay, and inadequate number of viral copies (<131 copies/mL). A negative result must be combined with clinical observations, patient history, and epidemiological information. The expected result is Negative. Fact Sheet for Patients:  PinkCheek.be Fact Sheet for Healthcare Providers:  GravelBags.it This test is not yet ap proved or cleared by the [place] FDA and  has been authorized for detection and/or diagnosis of SARS-CoV-2 by FDA under an Emergency Use Authorization (EUA). This EUA will remain  in effect (meaning this test can be used) for the duration of the COVID-19 declaration under Section 564(b)(1) of the Act, 21 U.S.C. section 360bbb-3(b)(1), unless the authorization is terminated or revoked sooner.    Influenza A by PCR NEGATIVE NEGATIVE Final   Influenza B by PCR NEGATIVE NEGATIVE Final    Comment: (NOTE) The Xpert Xpress SARS-CoV-2/FLU/RSV assay is intended as an aid in  the diagnosis of influenza from Nasopharyngeal swab specimens and  should not be used  as a sole basis for treatment. Nasal washings and  aspirates are unacceptable for Xpert Xpress SARS-CoV-2/FLU/RSV  testing. Fact Sheet for Patients: PinkCheek.be Fact Sheet for Healthcare Providers: GravelBags.it This test is not yet approved or cleared by the [place] FDA and  has been authorized for detection and/or diagnosis of SARS-CoV-2 by  FDA under an Emergency Use Authorization (EUA). This EUA will remain  in effect (meaning this test can be used) for the duration of the  Covid-19 declaration under Section 564(b)(1) of the Act, 21  U.S.C. section 360bbb-3(b)(1), unless the authorization is  terminated or revoked. Performed at [facility], [place] [address], [place], [place] [address]   Culture, blood (routine x 2)     Status: None (Preliminary result)   Collection Time: 10/05/19  6:35 PM   Specimen: BLOOD  Result Value Ref Range Status   Specimen Description BLOOD RIGHT ANTECUBITAL  Final   Special Requests   Final    BOTTLES DRAWN AEROBIC AND ANAEROBIC Blood Culture adequate volume   Culture NO GROWTH 3 DAYS  Final   Report Status PENDING  Incomplete  Culture, blood (routine x 2)     Status: None (Preliminary result)   Collection Time: 10/05/19  6:43 PM   Specimen: BLOOD LEFT HAND  Result Value Ref Range Status   Specimen Description BLOOD LEFT HAND  Final   Special Requests   Final    BOTTLES DRAWN AEROBIC ONLY Blood Culture adequate volume   Culture NO GROWTH 3 DAYS  Final   Report Status PENDING  Incomplete  Culture, Urine     Status: None   Collection Time: 10/07/19  9:55 AM   Specimen: Urine, Random  Result Value Ref Range Status   Specimen Description URINE, RANDOM  Final   Special Requests NONE  Final   Culture   Final    NO GROWTH Performed at [facility], [place] [address], [place], [place] [address]    Report Status 10/08/2019 FINAL  Final         Radiology Studies: DG Chest  Port 1 View  Result Date: 10/07/2019 CLINICAL DATA:  Fever. EXAM: PORTABLE CHEST 1 VIEW COMPARISON:  October 02, 2019. FINDINGS: Stable cardiomegaly. No pneumothorax is noted. Left lung is clear. New right basilar opacity is noted concerning for pneumonia. No significant pleural effusion is noted. Bony thorax is unremarkable. IMPRESSION: New right basilar opacity is noted concerning for pneumonia. Electronically Signed   By: Marijo Conception M.D.   On: 10/07/2019 10:25      Scheduled Meds: . acetaminophen  325 mg Oral Once  . chlorhexidine  15 mL Mouth Rinse BID  . Chlorhexidine Gluconate Cloth  6 each Topical Daily  . feeding supplement (NEPRO CARB STEADY)  237 mL Oral TID BM  . insulin aspart  0-9 Units Subcutaneous Q4H  . mouth rinse  15 mL Mouth Rinse q12n4p  . tamsulosin  0.4 mg Oral QPC supper   Continuous Infusions: . aztreonam 0.5 g (10/09/19 0557)  . dextrose 75 mL/hr at 10/08/19 1547  . metronidazole 500 mg (10/09/19 7092)     LOS: 7 days      Debbe Odea, MD [facility] Pager: [email or web address] Password [email or web address] 10/09/2019, 10:02 AM

## 2019-10-09 NOTE — Care Management Important Message (Signed)
Important Message  Patient Details  Name: Mark Baird. MRN: [id number] Date of Birth: 1946/06/04   Medicare Important Message Given:  Yes     Shelda Altes 10/09/2019, 11:53 AM

## 2019-10-10 LAB — CULTURE, BLOOD (ROUTINE X 2)
Culture: NO GROWTH
Culture: NO GROWTH
Special Requests: ADEQUATE
Special Requests: ADEQUATE

## 2019-10-10 NOTE — Progress Notes (Signed)
Manufacturing engineer [facility]) [facility] Liaison Note  Received request from [place] for family interest in [place] [address] for possible transfer today. Chart reviewed. Spoke with Antony Salmon (family member - NOK) who confirmed interest via telephone. Writer explained services, answered questions and family agreeable to transfer this afternoon. Family has been made aware of COVID pandemic visitation policy at [facility] and verbalized acceptance. Patient is eligible for hospice per [facility] MD.  Per Ozzie Hoyle, Registration paperwork to be completed with [facility] SW at [facility] within the hour (on his way at this time to meet SW) and will notify TOC when complete - hoping to have patient transport called by 4:30 pm today.   Please call with any Hospice related questions,  [facility] (in [place]) [phone]

## 2019-10-10 NOTE — Progress Notes (Signed)
Care plan reviewed. Pt's not progressing, clinically declined. Appeared drowsy, lethargy, weak, responded to voice and withdrew from pain stimuli, not followed commands.    We followed the comfort care per protocols. NPO due to Pt 's unable to tolerate or take any things per oral. RN day shift reported Pt had gurgling and coughing when trying to feed minimal fluid.   Remained afebrile, oral Temp 97.6 F, BP 114/63 mmHg EKG sinus tachycardia with BBB, CCMD notified at 01:03 am. EKG changed to Atrial fibrillation with BBB, HR 100. RR 19-21 with cheyne stokes breathing pattern, SPO2 97-99% on room air,   Kennyth Lose, RN

## 2019-10-10 NOTE — Progress Notes (Signed)
Report given to [place] requests both peripheral IV's stay in place so they don't have "stick him".    EMS to transfer patient.  CCMD notified.   Anson Crofts. RN

## 2019-10-10 NOTE — Discharge Summary (Signed)
Physician Discharge Summary  Marnee Guarneri. DJM:[id number] DOB: Dec 08, 1945 DOA: 10/02/2019  PCP: Sandi Mariscal, MD  Admit date: 10/02/2019 Discharge date: 10/10/2019  Admitted From: home Disposition:  Residential hospice    Discharge Condition:  stable   CODE STATUS:  DNR   Diet recommendation:  Diet as tolerated Consultations:  Nephrology  Palliative care    Discharge Diagnoses:  Principal Problem:   Hypernatremia/  Acute renal failure superimposed on chronic kidney disease   Active Problems: Multiple Myeloma    Pancytopenia   Toxic metabolic encephalopathy   Left knee pain due to hardware protrusion   DNR (do not resuscitate)   Palliative care by specialist   Adult failure to thrive   Brief Summary: Marnee Guarneri. 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   He was found laying on the couch by his neighbor who called 911. He was covered in urine and feces. Last known normal a couple of days ago. EMS reported full body tremors in route and patient was given Versed.No history could be obtained from the patient due to his altered mental status. In ED>tachycardia and tachypnea noted His Scr was elevated to 4.6 from baseline of 2. Sodium level was elevated at 164.  COVID-19 and influenza negative.    Hospital Course:  Principal Problem:   Hypernatremia - AKI on CKD 3 with metabolic acidosis - history of solitary left kidney Multiple Myeloma -Hypernatremia related to dehydration-sodium was 162 when admitted improved to 146 with D5 water- he is not able to eat or drink enough to maintain bodily functions - Creatinine was 1.67 on 01/04/2019- 2.06 and 08/06/2019- 4.67 on 10/02/2019 has steadily rose to 5.45 - ? If this was ATN -  Foley catheter placed in ED and Flomax started  - on Demadex at home which has been stopped - Renal ultrasound unremarkable-  - His acute decline may be related to multiple myeloma>> Nephrology and Onc consulted- Dr Irene Limbo states he is not a candidate for chemo and he planned to discuss goals of care with the patient however, when his office tried tocontact the patient at home, they did not receive a response. 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   Pneumonia- possible aspiration -Chest x-ray showed left lower lobe infiltrate versus atelectasis and he was started on levofloxacin -He continued to have a low-grade fever despite Levaquin -Blood cultures have been negative - 1/31> temp 100.10-  I ordered  a UA and urine culture - UA showed only few bacteria and WBCs- chest x-ray reveals a right basilar opacity concerning for pneumonia -? Aspiration - d/c'd Levaquin as he was having fever despite this - he is allergic to PCN which causes severe swelling and rash - 2/2> - started Azactam and Flagyl for RLL infiltrate - fevers resolved - antibiotics stopped on 2/3 when transitioned to comfort  care  Acute anemia and thrombocytopenia  - Hemoglobin has steadily drifted down from 9 to 6.7  - transfused 1 unit of packed red blood cells on 2/1 -Anemia panel checked on 10/05/2019 revealed an elevated ferritin at 613 and low iron levels - Platelets dropped to 39-d/c heparin and reversed Coumadin with 2 mg Vit K to prevent bleeding - ? If drop in Hb and platelets are related to Multiple myeloma- as mentioned, Dr Irene Limbo recommends palliative care/hospice  A-fib - converted to A-fib last night and then back to sinus tachycardia   Hyperglycemia - 2/2> start SSI- sugars in 200s- last A1c was 6.3- no formal diagnosis of  DM on the chart  Chronic combined systolic and diastolic heart failure -Last echo was in 5/20 and revealed an EF of 20% with grade 1 diastolic heart failure -Diuretics are on hold due to severe dehydration related to them and poor oral intake  Left lower extremity DVT The patient is on Coumadin but INR was subtherapeutic at 1.6 when he presented 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 The patient was receiving heparin which was discontinued as INR was therapeutic - Coumadin on hold as mentioned above due to thrombocytopenia  Left knee pain-  Hardware due to a prior left patella repair is nearly puncturing his skin - ortho eval on 1/28 > painful to movement- no infection suspected by ortho but recommended that hardware be removed once more stable    Discharge Exam: Vitals:   10/10/19 0507 10/10/19 0735  BP: (!) 121/106   Pulse: 99   Resp: 15   Temp: 98.3 F (36.8 C) (!) 97.4 F (36.3 C)  SpO2: 100%    Vitals:   10/10/19 0200 10/10/19 0506 10/10/19 0507 10/10/19 0735  BP:  (!) 121/106 (!) 121/106   Pulse: (!) 101 98 99   Resp: (!) 25 20 15    Temp:   98.3 F (36.8 C) (!) 97.4 F (36.3 C)  TempSrc:   Oral Oral  SpO2: 100% (!) 59% 100%   Weight:        General: Pt is awake but eyelids half closed, give short answers but overall not  conversant, persistent hiccoughs, tachycardic  Discharge Instructions   Allergies as of 10/10/2019      Reactions   Penicillins Swelling   Has patient had a PCN reaction causing immediate rash, facial/tongue/throat swelling, SOB or lightheadedness with hypotension: Yes Has patient had a PCN reaction causing severe rash involving mucus membranes or skin necrosis: No Has patient had a PCN reaction that required hospitalization:No Has patient had a PCN reaction occurring within the last 10 years: No If all of the above answers are "NO", then may proceed with Cephalosporin use.   Molds & Smuts Other (See Comments)   Due to allergy testing.      Medication List    STOP taking these medications   acyclovir 200 MG capsule Commonly known as: ZOVIRAX   allopurinol 300 MG tablet Commonly known as: ZYLOPRIM   BiDil 20-37.5 MG tablet Generic drug: isosorbide-hydrALAZINE   colchicine 0.6 MG tablet  dexamethasone 4 MG tablet Commonly known as: DECADRON   ixazomib citrate 3 MG capsule Commonly known as: Ninlaro   metoprolol succinate 25 MG 24 hr tablet Commonly known as: TOPROL-XL   ondansetron 8 MG tablet Commonly known as: ZOFRAN   potassium chloride SA 20 MEQ tablet Commonly known as: KLOR-CON   pravastatin 20 MG tablet Commonly known as: PRAVACHOL   torsemide 20 MG tablet Commonly known as: DEMADEX   warfarin 10 MG tablet Commonly known as: COUMADIN       Allergies  Allergen Reactions  . Penicillins Swelling    Has patient had a PCN reaction causing immediate rash, facial/tongue/throat swelling, SOB or lightheadedness with hypotension: Yes Has patient had a PCN reaction causing severe rash involving mucus membranes or skin necrosis: No Has patient had a PCN reaction that required hospitalization:No Has patient had a PCN reaction occurring within the last 10 years: No If all of the above answers are "NO", then may proceed with Cephalosporin use.   . Molds & Smuts  Other (See Comments)    Due to allergy testing.     Procedures/Studies:    EEG  Result Date: 10/02/2019 Lora Havens, MD     10/02/2019  4:56 PM Patient Name: Marnee Guarneri. MRN: [id number] Epilepsy Attending: Lora Havens Referring Physician/Provider: Etta Quill, PA Date: 10/02/2019 Duration: 23.11 mins Patient history: 74yo M with right frontal infarct, found at house altered and covered in urine , feces. Per EMS he had full body tremors concerning for seizure. EEG to evaluate for seizure Level of alertness: lethargic AEDs during EEG study: None Technical aspects: This EEG study was done with scalp electrodes positioned according to the 10-20 International system of electrode placement. Electrical activity was acquired at a sampling rate of 500Hz  and reviewed with a high frequency filter of 70Hz  and a low frequency filter of 1Hz . EEG data were recorded continuously and digitally stored. DESCRIPTION: EEG showed continuous generalized and lateralized right hemisphere 3-5Hz  theta-delta slowing. EEG was reactive to tactile stimulation. Hyperventilation and photic stimulation were not performed. ABNORMALITY - Continuous slow, generalized and lateralized right hemisphere IMPRESSION: This study is suggestive of cortical dysfunction in right hemisphere likely secondary to underlying stroke as well as moderate diffuse encephalopathy, non specific to etiology. No seizures or epileptiform discharges were seen throughout the recording. Lora Havens   DG Clavicle Right  Result Date: 10/02/2019 CLINICAL DATA:  Fall. Pt was nonverbal for imaging. Pt did display sounds of pain when rolling for images of his left hip. Pt did not display any signs of pain or acknowledgement when imaging his right clavicle. EXAM: RIGHT CLAVICLE - 2+ VIEWS COMPARISON:  None. FINDINGS: No fracture or bone lesion. AC joint normally spaced and aligned. Small marginal spurs at the [facility] joint. Glenohumeral joint is normally  aligned. Soft tissues are unremarkable. IMPRESSION: 1. No fracture or dislocation. 2. Mild AC joint osteoarthritis. Electronically Signed   By: Lajean Manes M.D.   On: 10/02/2019 16:08   DG Tibia/Fibula Left  Result Date: 10/02/2019 CLINICAL DATA:  Left leg pain. EXAM: LEFT TIBIA AND FIBULA - 2 VIEW COMPARISON:  Left hip evaluation from January 27th 2021 FINDINGS: Osteopenia. Signs of previous surgery about the left knee with a screw spanning the patella. There are wires which are fractured seen throughout the soft tissues about the knee. Soft tissue swelling is noted in the suprapatellar region partially imaged. Marked tricompartmental osteoarthritic changes are present. No signs of acute fracture or discrete  bone abnormality aside from findings above. IMPRESSION: 1. No signs of acute fracture. 2. Postoperative changes about the patella with signs of hardware failure of unknown chronicity but associated with soft tissue swelling correlate with acute pain in this area. Comparison with prior imaging could be helpful. 3. Dedicated assessment of the knee may be warranted if there is focal pain in this area. 4. Marked tricompartmental osteoarthritic change. Electronically Signed   By: Zetta Bills M.D.   On: 10/02/2019 18:10   CT Head Wo Contrast  Result Date: 10/02/2019 CLINICAL DATA:  Altered mental status. EXAM: CT HEAD WITHOUT CONTRAST TECHNIQUE: Contiguous axial images were obtained from the base of the skull through the vertex without intravenous contrast. COMPARISON:  September 11, 2019 FINDINGS: Brain: There is moderate severity cerebral atrophy with widening of the extra-axial spaces and ventricular dilatation. There are areas of decreased attenuation within the white matter tracts of the supratentorial brain, consistent with microvascular disease changes. A small to moderate sized area of cortical encephalomalacia, with adjacent chronic white matter low attenuation, is again seen within the right  frontal lobe. Vascular: No hyperdense vessel or unexpected calcification. Skull: Normal. Negative for fracture or focal lesion. Sinuses/Orbits: No acute finding. Other: None. IMPRESSION: 1. Generalized cerebral atrophy. 2. Chronic right frontal lobe infarct. 3. No acute intracranial abnormality. Electronically Signed   By: Virgina Norfolk M.D.   On: 10/02/2019 16:15   CT Head Wo Contrast  Result Date: 09/11/2019 CLINICAL DATA:  74 year old male with multiple myeloma relapse. Focal abnormally decreased PET activity in the right frontal lobe on PET-CT last month. EXAM: CT HEAD WITHOUT CONTRAST TECHNIQUE: Contiguous axial images were obtained from the base of the skull through the vertex without intravenous contrast. COMPARISON:  PET-CT 08/23/2019. FINDINGS: Brain: Abnormal confluent hypodensity in the anterior right middle frontal gyrus corresponding to the abnormal area on PET. A component of this is cortical encephalomalacia as seen on coronal image 20. There is a larger area of surrounding white matter hypodensity without mass effect, and with mild asymmetric enlargement of the right lateral ventricle (coronal image 27) suggesting chronic gliosis. There are also smaller areas of cortical encephalomalacia noted along the superior and posterior margins of the white matter gliosis such as on series 2, image 27. No regional mass effect identified. No midline shift. Elsewhere gray-white matter differentiation is within normal limits. No midline shift, ventriculomegaly, mass effect, evidence of mass lesion, intracranial hemorrhage or evidence of cortically based acute infarction. Vascular: Mild Calcified atherosclerosis at the skull base. Skull: Congenital incomplete ossification of the posterior C1 ring. There is mild widening of the atlanto dens interval, 3-4 millimeters which is stable from the CT last month. No superimposed destructive skull lesion is identified. Sinuses/Orbits: Visualized paranasal sinuses and  mastoids are clear. Other: Visualized orbits and scalp soft tissues are within normal limits. IMPRESSION: 1. Abnormal right middle frontal gyrus as seen by PET-CT last month appears most consistent with chronic encephalomalacia such as from prior anterior Right MCA territory infarct. 2.  No acute intracranial abnormality identified. 3. Suspect chronic degenerative instability in the upper cervical spine at C1-C2. Electronically Signed   By: Genevie [name] M.D.   On: 09/11/2019 15:55   CT Cervical Spine Wo Contrast  Result Date: 10/02/2019 CLINICAL DATA:  Altered mental status with possible fall. EXAM: CT CERVICAL SPINE WITHOUT CONTRAST TECHNIQUE: Multidetector CT imaging of the cervical spine was performed without intravenous contrast. Multiplanar CT image reconstructions were also generated. COMPARISON:  None. FINDINGS: Alignment: Normal. Skull  base and vertebrae: No acute fracture. No primary bone lesion or focal pathologic process. Soft tissues and spinal canal: No prevertebral fluid or swelling. No visible canal hematoma. Disc levels: C2-3: There is mild end plate spondylosis. Mild disc space narrowing is seen. Bilateral facet hypertrophy is noted. Normal central canal and intervertebral neuroforamina. C3-4: There is moderate to marked severity end plate spondylosis. Marked severity disc space narrowing is seen. Bilateral facet hypertrophy is noted. Normal central canal and intervertebral neuroforamina. C4-5: There is moderate to marked severity end plate spondylosis. Marked severity disc space narrowing is seen. Bilateral facet hypertrophy is noted. Normal central canal and intervertebral neuroforamina. C5-6: There is moderate to marked severity end plate spondylosis. Marked severity disc space narrowing is seen. Bilateral facet hypertrophy is noted. Normal central canal and intervertebral neuroforamina. C6-7: There is mild to moderate severity end plate spondylosis. Mild to moderate severity disc space narrowing  is seen. Bilateral facet hypertrophy is noted. Normal central canal and intervertebral neuroforamina. C7-T1: There is mild end plate spondylosis. Mild disc space narrowing is seen. Bilateral facet hypertrophy is noted. Normal central canal and intervertebral neuroforamina. Upper chest: Negative. Other: None. IMPRESSION: 1. Marked severity multilevel degenerative changes without evidence of acute osseous abnormality. Electronically Signed   By: Virgina Norfolk M.D.   On: 10/02/2019 16:20   MR BRAIN WO CONTRAST  Result Date: 10/06/2019 CLINICAL DATA:  74 year old male with solitary kidney and chronic renal disease. Altered mental status. Multiple myeloma. Abnormally decreased PET activity in the right frontal lobe on PET-CT earlier this month, thought to be right MCA territory encephalomalacia on subsequent noncontrast head CT. EXAM: MRI HEAD WITHOUT CONTRAST TECHNIQUE: Multiplanar, multiecho pulse sequences of the brain and surrounding structures were obtained without intravenous contrast. COMPARISON:  Head CT 10/02/2019 and earlier. FINDINGS: Study is intermittently degraded by motion artifact despite repeated imaging attempts. Brain: No restricted diffusion or evidence of acute infarction. Confirmed right frontal lobe middle and superior frontal gyri encephalomalacia (series 18, image 17). There is additional nearby right parietal lobe encephalomalacia. Mild associated hemosiderin. There is also patchy encephalomalacia in the right occipital lobe. Scattered additional cerebral white matter T2 and FLAIR hyperintensity. No midline shift, mass effect, evidence of mass lesion, ventriculomegaly, extra-axial collection or acute intracranial hemorrhage. Cervicomedullary junction and pituitary are within normal limits. Possible chronic microhemorrhage in the left cerebellum on series 14, image 15. Vascular: Major intracranial vascular flow voids are preserved. Skull and upper cervical spine: Cervical spine detail  degraded by motion, but there is abnormal DWI signal in the visible cervical vertebrae. No destructive calvarium lesion identified. Sinuses/Orbits: Negative orbits. Paranasal Visualized paranasal sinuses and mastoids are stable and well pneumatized. Other: Visible internal auditory structures appear normal. Scalp and face soft tissues appear negative. IMPRESSION: 1. No acute intracranial abnormality. 2. Chronic right MCA territory ischemia and encephalomalacia. Mild similar changes in the right PCA territory. 3. Motion degraded exam, with abnormal signal in the cervical spine suspicious for involvement by multiple myeloma. Electronically Signed   By: Genevie [name] M.D.   On: 10/06/2019 18:23   US RENAL  Result Date: 10/05/2019 CLINICAL DATA:  Acute renal failure.  Solitary left kidney. EXAM: RENAL / URINARY TRACT ULTRASOUND COMPLETE COMPARISON:  PET-CT on 08/23/2019 FINDINGS: Right Kidney: Not visualized. Left Kidney: Renal measurements: 11.8 x 6.5 x 5.6 cm = volume: 223 mL. Suboptimally visualized. Echogenicity within normal limits. No mass identified. Mild pelvicaliectasis is seen. Bladder: Appears normal for degree of bladder distention. Other: None. IMPRESSION: Solitary left kidney  with mild pelvicaliectasis. Electronically Signed   By: Marlaine Hind M.D.   On: 10/05/2019 16:15   DG Pelvis Portable  Result Date: 10/02/2019 CLINICAL DATA:  Fall with left hip pain. EXAM: PORTABLE PELVIS 1-2 VIEWS COMPARISON:  None. FINDINGS: Left hip in internal rotation. No fracture visible given this projection. Suggest additional imaging if concern persists. IMPRESSION: Internally rotated left hip. No fracture visible. Additional images suggested if concern persists. Electronically Signed   By: Nelson Chimes M.D.   On: 10/02/2019 15:08   DG Chest Port 1 View  Result Date: 10/07/2019 CLINICAL DATA:  Fever. EXAM: PORTABLE CHEST 1 VIEW COMPARISON:  October 02, 2019. FINDINGS: Stable cardiomegaly. No pneumothorax is noted. Left  lung is clear. New right basilar opacity is noted concerning for pneumonia. No significant pleural effusion is noted. Bony thorax is unremarkable. IMPRESSION: New right basilar opacity is noted concerning for pneumonia. Electronically Signed   By: Marijo Conception M.D.   On: 10/07/2019 10:25   DG Chest Portable 1 View  Result Date: 10/02/2019 CLINICAL DATA:  [place].  Altered mental status. EXAM: PORTABLE CHEST 1 VIEW COMPARISON:  06/28/2018 FINDINGS: Cardiomegaly. Aortic atherosclerosis. Right lung is clear. Minimal atelectasis or infiltrate at the left base. No evidence of heart failure or effusion. No regional traumatic bone finding. IMPRESSION: Mild left base atelectasis or infiltrate.  Otherwise negative. Electronically Signed   By: Nelson Chimes M.D.   On: 10/02/2019 15:09   DG Hip Unilat W or Wo Pelvis 2-3 Views Left  Result Date: 10/02/2019 CLINICAL DATA:  Fall. Pt was nonverbal for imaging. Pt did display sounds of pain when rolling for images of his left hip. Pt did not display any signs of pain or acknowledgement when imaging his right clavicle. EXAM: DG HIP (WITH OR WITHOUT PELVIS) 2-3V LEFT COMPARISON:  None. FINDINGS: No fracture or bone lesion. Left hip joint normally spaced and aligned. No arthropathic changes. Soft tissues are unremarkable. IMPRESSION: Negative. Electronically Signed   By: Lajean Manes M.D.   On: 10/02/2019 16:07   DG Femur Min 2 Views Left  Result Date: 10/02/2019 CLINICAL DATA:  Left leg pain. EXAM: LEFT FEMUR 2 VIEWS COMPARISON:  None. FINDINGS: There is no evidence of fracture or other focal bone lesions. A radiopaque surgical screw and thin curvilinear surgical sutures are seen within the left patella. Surgical sutures are also seen within the soft tissues superior to the left patella. There is a small joint effusion involving the left knee. IMPRESSION: 1. Postoperative changes involving the left knee, as described above. 2. No acute osseous abnormality. 3. Small left  knee joint effusion. Electronically Signed   By: Virgina Norfolk M.D.   On: 10/02/2019 18:07   VAS [place] LOWER EXTREMITY VENOUS (DVT)  Result Date: 10/03/2019  Lower Venous Study Indications: History of DVT, Swelling, and Pain.  Anticoagulation: Coumadin. Comparison Study: No prior studies on file. Performing Technologist: Oda Cogan RDMS, RVT  Examination Guidelines: A complete evaluation includes B-mode imaging, spectral Doppler, color Doppler, and power Doppler as needed of all accessible portions of each vessel. Bilateral testing is considered an integral part of a complete examination. Limited examinations for reoccurring indications may be performed as noted.  +-----+---------------+---------+-----------+----------+--------------+ RIGHTCompressibilityPhasicitySpontaneityPropertiesThrombus Aging +-----+---------------+---------+-----------+----------+--------------+ CFV  Full           Yes                                          +-----+---------------+---------+-----------+----------+--------------+  SFJ  Full                                                        +-----+---------------+---------+-----------+----------+--------------+   +---------+---------------+---------+-----------+----------+--------------+ LEFT     CompressibilityPhasicitySpontaneityPropertiesThrombus Aging +---------+---------------+---------+-----------+----------+--------------+ CFV      Full           Yes      Yes                                 +---------+---------------+---------+-----------+----------+--------------+ SFJ      Full                                                        +---------+---------------+---------+-----------+----------+--------------+ FV Prox  Partial                                      Chronic        +---------+---------------+---------+-----------+----------+--------------+ FV Mid   Partial        Yes      Yes                  Chronic         +---------+---------------+---------+-----------+----------+--------------+ FV DistalPartial                                      Chronic        +---------+---------------+---------+-----------+----------+--------------+ PFV      Full                                                        +---------+---------------+---------+-----------+----------+--------------+ POP      Full           Yes      Yes                                 +---------+---------------+---------+-----------+----------+--------------+ PTV      Full                                                        +---------+---------------+---------+-----------+----------+--------------+ PERO     Partial                                      Chronic        +---------+---------------+---------+-----------+----------+--------------+     Summary: Right: No evidence of common femoral vein obstruction. Left: Findings consistent with chronic deep vein thrombosis involving the left femoral vein, and left peroneal veins. No  cystic structure found in the popliteal fossa.  *See table(s) above for measurements and observations. Electronically signed by Monica Martinez MD on 10/03/2019 at 4:22:11 PM.    Final      The results of significant diagnostics from this hospitalization (including imaging, microbiology, ancillary and laboratory) are listed below for reference.     Microbiology: Recent Results (from the past 240 hour(s))  Respiratory Panel by RT PCR (Flu A&B, Covid) - Nasopharyngeal Swab     Status: None   Collection Time: 10/02/19  3:03 PM   Specimen: Nasopharyngeal Swab  Result Value Ref Range Status   SARS Coronavirus 2 by RT PCR NEGATIVE NEGATIVE Final    Comment: (NOTE) SARS-CoV-2 target nucleic acids are NOT DETECTED. The SARS-CoV-2 RNA is generally detectable in upper respiratoy specimens during the acute phase of infection. The lowest concentration of SARS-CoV-2 viral copies this assay can detect  is 131 copies/mL. A negative result does not preclude SARS-Cov-2 infection and should not be used as the sole basis for treatment or other patient management decisions. A negative result may occur with  improper specimen collection/handling, submission of specimen other than nasopharyngeal swab, presence of viral mutation(s) within the areas targeted by this assay, and inadequate number of viral copies (<131 copies/mL). A negative result must be combined with clinical observations, patient history, and epidemiological information. The expected result is Negative. Fact Sheet for Patients:  PinkCheek.be Fact Sheet for Healthcare Providers:  GravelBags.it This test is not yet ap proved or cleared by the [place] FDA and  has been authorized for detection and/or diagnosis of SARS-CoV-2 by FDA under an Emergency Use Authorization (EUA). This EUA will remain  in effect (meaning this test can be used) for the duration of the COVID-19 declaration under Section 564(b)(1) of the Act, 21 U.S.C. section 360bbb-3(b)(1), unless the authorization is terminated or revoked sooner.    Influenza A by PCR NEGATIVE NEGATIVE Final   Influenza B by PCR NEGATIVE NEGATIVE Final    Comment: (NOTE) The Xpert Xpress SARS-CoV-2/FLU/RSV assay is intended as an aid in  the diagnosis of influenza from Nasopharyngeal swab specimens and  should not be used as a sole basis for treatment. Nasal washings and  aspirates are unacceptable for Xpert Xpress SARS-CoV-2/FLU/RSV  testing. Fact Sheet for Patients: PinkCheek.be Fact Sheet for Healthcare Providers: GravelBags.it This test is not yet approved or cleared by the [place] FDA and  has been authorized for detection and/or diagnosis of SARS-CoV-2 by  FDA under an Emergency Use Authorization (EUA). This EUA will remain  in effect (meaning this  test can be used) for the duration of the  Covid-19 declaration under Section 564(b)(1) of the Act, 21  U.S.C. section 360bbb-3(b)(1), unless the authorization is  terminated or revoked. Performed at [facility], [place] [address], [place], [place] [address]   Culture, blood (routine x 2)     Status: None (Preliminary result)   Collection Time: 10/05/19  6:35 PM   Specimen: BLOOD  Result Value Ref Range Status   Specimen Description BLOOD RIGHT ANTECUBITAL  Final   Special Requests   Final    BOTTLES DRAWN AEROBIC AND ANAEROBIC Blood Culture adequate volume   Culture   Final    NO GROWTH 4 DAYS Performed at [facility], [place] [address] [place], [place] [address]    Report Status PENDING  Incomplete  Culture, blood (routine x 2)     Status: None (Preliminary result)   Collection Time:  10/05/19  6:43 PM   Specimen: BLOOD LEFT HAND  Result Value Ref Range Status   Specimen Description BLOOD LEFT HAND  Final   Special Requests   Final    BOTTLES DRAWN AEROBIC ONLY Blood Culture adequate volume   Culture   Final    NO GROWTH 4 DAYS Performed at [facility], [address], [place], [place] [address]    Report Status PENDING  Incomplete  Culture, Urine     Status: None   Collection Time: 10/07/19  9:55 AM   Specimen: Urine, Random  Result Value Ref Range Status   Specimen Description URINE, RANDOM  Final   Special Requests NONE  Final   Culture   Final    NO GROWTH Performed at [facility], [place] [address] [place], [place] [address]    Report Status 10/08/2019 FINAL  Final     Labs: BNP (last 3 results) No results for input(s): BNP in the last 8760 hours. Basic Metabolic Panel: Recent Labs  Lab 10/06/19 0424 10/07/19 0454 10/08/19 0458 10/09/19 0307 10/09/19 0849  NA 149* 146* 149* 157* 157*  K 3.9 4.3 3.8 3.4* 3.6  CL 110 110 112* 119* 119*  CO2 23 20* 22 23 23   GLUCOSE 228* 216* 237* 245* 230*  BUN 138* 143* 148* 143* 139*  CREATININE  5.69* 5.45* 4.54* 4.16* 4.11*  CALCIUM 8.8* 8.5* 8.6* 8.7* 8.8*  MG 2.6*  --   --   --   --   PHOS 4.6 4.9* 4.5 4.4  --    Liver Function Tests: Recent Labs  Lab 10/06/19 0424 10/06/19 0917 10/07/19 0454 10/08/19 0458 10/09/19 0307  AST  --  24  --   --  20  ALT  --  23  --   --  24  ALKPHOS  --  74  --   --  96  BILITOT  --  2.6*  --   --  1.0  PROT  --  7.8  --   --  7.6  ALBUMIN 1.5* 1.5* 1.4* 1.5* 1.5*   No results for input(s): LIPASE, AMYLASE in the last 168 hours. No results for input(s): AMMONIA in the last 168 hours. CBC: Recent Labs  Lab 10/05/19 0340 10/05/19 0949 10/06/19 0424 10/07/19 0454 10/07/19 1710 10/08/19 0458 10/09/19 0307  WBC 13.2*  --  12.1* 12.4*  --  9.9 9.2  HGB 8.5*  --  7.9* 6.7* 8.1* 7.6* 7.7*  HCT 27.7*   < > 24.9* 21.3* 24.1* 23.5* 24.0*  MCV 104.9*  --  98.8 100.5*  --  98.3 99.2  PLT 62*  --  53* 45*  --  39* 44*   < > = values in this interval not displayed.   Cardiac Enzymes: Recent Labs  Lab 10/04/19 1125  CKTOTAL 222   BNP: Invalid input(s): POCBNP CBG: Recent Labs  Lab 10/08/19 1559 10/08/19 2029 10/09/19 0048 10/09/19 0433 10/09/19 0742  GLUCAP 201* 188* 225* 221* 197*   D-Dimer No results for input(s): DDIMER in the last 72 hours. Hgb A1c No results for input(s): HGBA1C in the last 72 hours. Lipid Profile No results for input(s): CHOL, HDL, LDLCALC, TRIG, CHOLHDL, LDLDIRECT in the last 72 hours. Thyroid function studies No results for input(s): TSH, T4TOTAL, T3FREE, THYROIDAB in the last 72 hours.  Invalid input(s): FREET3 Anemia work up Recent Labs    10/08/19 1001  VITAMINB12 >7,500*  FOLATE 7.0  FERRITIN 864*  TIBC  125*  IRON 28*  RETICCTPCT 1.1   Urinalysis    Component Value Date/Time   COLORURINE YELLOW 10/07/2019 0957   APPEARANCEUR CLOUDY (A) 10/07/2019 0957   LABSPEC 1.012 10/07/2019 0957   PHURINE 5.0 10/07/2019 0957   GLUCOSEU NEGATIVE 10/07/2019 0957   HGBUR MODERATE (A)  10/07/2019 0957   BILIRUBINUR NEGATIVE 10/07/2019 0957   KETONESUR NEGATIVE 10/07/2019 0957   PROTEINUR 30 (A) 10/07/2019 0957   NITRITE NEGATIVE 10/07/2019 0957   LEUKOCYTESUR TRACE (A) 10/07/2019 0957   Sepsis Labs Invalid input(s): PROCALCITONIN,  WBC,  LACTICIDVEN Microbiology Recent Results (from the past 240 hour(s))  Respiratory Panel by RT PCR (Flu A&B, Covid) - Nasopharyngeal Swab     Status: None   Collection Time: 10/02/19  3:03 PM   Specimen: Nasopharyngeal Swab  Result Value Ref Range Status   SARS Coronavirus 2 by RT PCR NEGATIVE NEGATIVE Final    Comment: (NOTE) SARS-CoV-2 target nucleic acids are NOT DETECTED. The SARS-CoV-2 RNA is generally detectable in upper respiratoy specimens during the acute phase of infection. The lowest concentration of SARS-CoV-2 viral copies this assay can detect is 131 copies/mL. A negative result does not preclude SARS-Cov-2 infection and should not be used as the sole basis for treatment or other patient management decisions. A negative result may occur with  improper specimen collection/handling, submission of specimen other than nasopharyngeal swab, presence of viral mutation(s) within the areas targeted by this assay, and inadequate number of viral copies (<131 copies/mL). A negative result must be combined with clinical observations, patient history, and epidemiological information. The expected result is Negative. Fact Sheet for Patients:  PinkCheek.be Fact Sheet for Healthcare Providers:  GravelBags.it This test is not yet ap proved or cleared by the [place] FDA and  has been authorized for detection and/or diagnosis of SARS-CoV-2 by FDA under an Emergency Use Authorization (EUA). This EUA will remain  in effect (meaning this test can be used) for the duration of the COVID-19 declaration under Section 564(b)(1) of the Act, 21 U.S.C. section 360bbb-3(b)(1), unless  the authorization is terminated or revoked sooner.    Influenza A by PCR NEGATIVE NEGATIVE Final   Influenza B by PCR NEGATIVE NEGATIVE Final    Comment: (NOTE) The Xpert Xpress SARS-CoV-2/FLU/RSV assay is intended as an aid in  the diagnosis of influenza from Nasopharyngeal swab specimens and  should not be used as a sole basis for treatment. Nasal washings and  aspirates are unacceptable for Xpert Xpress SARS-CoV-2/FLU/RSV  testing. Fact Sheet for Patients: PinkCheek.be Fact Sheet for Healthcare Providers: GravelBags.it This test is not yet approved or cleared by the [place] FDA and  has been authorized for detection and/or diagnosis of SARS-CoV-2 by  FDA under an Emergency Use Authorization (EUA). This EUA will remain  in effect (meaning this test can be used) for the duration of the  Covid-19 declaration under Section 564(b)(1) of the Act, 21  U.S.C. section 360bbb-3(b)(1), unless the authorization is  terminated or revoked. Performed at [facility], [place] [address] [place], [place] [address]   Culture, blood (routine x 2)     Status: None (Preliminary result)   Collection Time: 10/05/19  6:35 PM   Specimen: BLOOD  Result Value Ref Range Status   Specimen Description BLOOD RIGHT ANTECUBITAL  Final   Special Requests   Final    BOTTLES DRAWN AEROBIC AND ANAEROBIC Blood Culture adequate volume   Culture   Final    NO GROWTH 4 DAYS  Performed at [facility], [place] [address] [place], [place] [address]    Report Status PENDING  Incomplete  Culture, blood (routine x 2)     Status: None (Preliminary result)   Collection Time: 10/05/19  6:43 PM   Specimen: BLOOD LEFT HAND  Result Value Ref Range Status   Specimen Description BLOOD LEFT HAND  Final   Special Requests   Final    BOTTLES DRAWN AEROBIC ONLY Blood Culture adequate volume   Culture   Final    NO GROWTH 4 DAYS Performed at [facility], [place] [address], [place], [place] [address]    Report Status PENDING  Incomplete  Culture, Urine     Status: None   Collection Time: 10/07/19  9:55 AM   Specimen: Urine, Random  Result Value Ref Range Status   Specimen Description URINE, RANDOM  Final   Special Requests NONE  Final   Culture   Final    NO GROWTH Performed at [facility], [place] [address] [place], [place] [address]    Report Status 10/08/2019 FINAL  Final     Time coordinating discharge in minutes: 65  SIGNED:   Debbe Odea, MD  [facility] 10/10/2019, 9:51 AM Pager   If 7PM-7AM, please contact night-coverage [email or web address] Password [email or web address]

## 2019-10-10 NOTE — Progress Notes (Addendum)
At 02:00 am,EKG turned back to sinus rhythm/sinus tachycardia, HR 100-110. O2 NCL 2 lpm given for comfort. SPO2 999-100% with Cheyne stokes respiratory pattern. Pt was more alert and more cooperated, able to follow simple commands. Will monitor.  Kennyth Lose, RN

## 2019-10-10 NOTE — Plan of Care (Signed)
  Problem: Education: Goal: Knowledge of General Education information will improve Description: Including pain rating scale, medication(s)/side effects and non-pharmacologic comfort measures Outcome: Adequate for Discharge   Problem: Health Behavior/Discharge Planning: Goal: Ability to manage health-related needs will improve Outcome: Adequate for Discharge   Problem: Clinical Measurements: Goal: Ability to maintain clinical measurements within normal limits will improve Outcome: Adequate for Discharge Goal: Will remain free from infection Outcome: Adequate for Discharge Goal: Diagnostic test results will improve Outcome: Adequate for Discharge Goal: Respiratory complications will improve Outcome: Adequate for Discharge Goal: Cardiovascular complication will be avoided Outcome: Adequate for Discharge   Problem: Activity: Goal: Risk for activity intolerance will decrease Outcome: Adequate for Discharge   Problem: Nutrition: Goal: Adequate nutrition will be maintained Outcome: Adequate for Discharge   Problem: Coping: Goal: Level of anxiety will decrease Outcome: Adequate for Discharge   Problem: Elimination: Goal: Will not experience complications related to bowel motility Outcome: Adequate for Discharge Goal: Will not experience complications related to urinary retention Outcome: Adequate for Discharge   Problem: Pain Managment: Goal: General experience of comfort will improve Outcome: Adequate for Discharge   Problem: Safety: Goal: Ability to remain free from injury will improve Outcome: Adequate for Discharge   Problem: Skin Integrity: Goal: Risk for impaired skin integrity will decrease Outcome: Adequate for Discharge   Problem: SLP Dysphagia Goals Goal: Patient will utilize recommended strategies Description: Patient will utilize recommended strategies during swallow to increase swallowing safety with Outcome: Adequate for Discharge Goal: Misc Dysphagia  Goal Outcome: Adequate for Discharge   Problem: Inadequate Intake (NI-2.1) Goal: Food and/or nutrient delivery Description: Individualized approach for food/nutrient provision. Outcome: Adequate for Discharge

## 2019-10-10 NOTE — TOC Progression Note (Signed)
Transition of Care (TOC) - Progression Note    Patient Details  Name: Mark Baird. MRN: [id number] Date of Birth: 10/17/1945  Transition of Care [facility]) CM/SW [place], [place] Phone Number: 10/10/2019, 10:03 AM  Clinical Narrative:     CSW made referral to [facility]). They will review and contact CSW.  Thurmond Butts, MSW, [place] Clinical Social Worker   Expected Discharge Plan: [place] Barriers to Discharge: SNF Pending bed offer  Expected Discharge Plan and Services Expected Discharge Plan: [place] In-house Referral: Clinical Social Work       Expected Discharge Date: 10/10/19                                     Social Determinants of Health (SDOH) Interventions    Readmission Risk Interventions No flowsheet data found.

## 2019-10-10 NOTE — TOC Transition Note (Signed)
Transition of Care [facility]) - CM/SW Discharge Note   Patient Details  Name: Mark Baird. MRN: [id number] Date of Birth: [birth year]  Transition of Care [facility]) CM/SW Contact:  Vinie Sill, [place] Phone Number: 10/10/2019, 2:36 PM   Clinical Narrative:     Patient will DC to: [facility] Date: 10/10/2019 Family Notified: Ozzie Hoyle, cousin Transport By: Corey Harold @ 4:00pm  RN, patient, and facility notified of DC. Discharge Summary sent to facility. RN given number for [phone]. Ambulance transport requested for patient.  Clinical Social Worker signing off. Thurmond Butts, MSW, [place] Clinical Social Worker    Final next level of care: [place] Barriers to Discharge: SNF Pending bed offer   Patient Goals and CMS Choice        Discharge Placement              Patient chooses bed at: ([facility]) Patient to be transferred to facility by: [place] Name of family member notified: Antony Salmon, cousin Patient and family notified of of transfer: 10/10/19  Discharge Plan and Services In-house Referral: Clinical Social Work                                   Social Determinants of Health ([place]) Interventions     Readmission Risk Interventions No flowsheet data found.

## 2019-10-10 NOTE — Progress Notes (Signed)
Pt left the unit, transferred to [facility] by PTAR. Dr. Hal Hope notified due to request MD signed DNR form before Pt's leaving. Dr. Hal Hope signed DNR form, AVS  put in envelope given to PTAR with all Pt's belongings( his personal cloth).  Vital signs remained stable, no distress.   Kennyth Lose, RN

## 2019-10-30 IMAGING — NM NM PULMONARY VENT & PERF
16 series · 16 of 16 positions shown · non-contrast
Comparison: Chest x-ray 06/28/2018.

CLINICAL DATA: Chest pain.  Shortness of breath.

EXAM:
NUCLEAR MEDICINE VENTILATION - PERFUSION LUNG SCAN
TECHNIQUE: Ventilation images were obtained in multiple projections using
inhaled aerosol 0c-99m DTPA. Perfusion images were obtained in
multiple projections after intravenous injection of [id number].
RADIOPHARMACEUTICALS:  32.0 mCi of 0c-99m DTPA aerosol inhalation
and 4.1 mCi [id number] IV

[Series 1: ant/post vent · 4.14mm/px · 1 of 1 slices shown (1 of 2)]
[im 1/1]
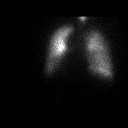

[Series 1: ant/post vent · 4.14mm/px · 1 of 1 slices shown (2 of 2)]
[im 1/1]
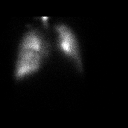

[Series 2: lao/rpo vent · 4.14mm/px · 1 of 1 slices shown (1 of 2)]
[im 1/1]
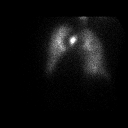

[Series 2: lao/rpo vent · 4.14mm/px · 1 of 1 slices shown (2 of 2)]
[im 1/1]
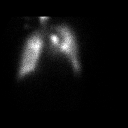

[Series 3: lpo/rao vent · 4.14mm/px · 1 of 1 slices shown (1 of 2)]
[im 1/1]
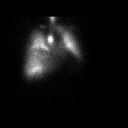

[Series 3: lpo/rao vent · 4.14mm/px · 1 of 1 slices shown (2 of 2)]
[im 1/1]
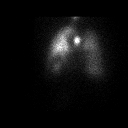

[Series 4: lt lat/rt lat vent · 4.14mm/px · 1 of 1 slices shown (1 of 2)]
[im 1/1]
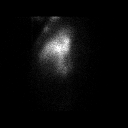

[Series 4: lt lat/rt lat vent · 4.14mm/px · 1 of 1 slices shown (2 of 2)]
[im 1/1]
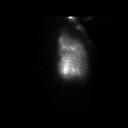

[Series 5: lt lat/rt lat perf · 4.14mm/px · 1 of 1 slices shown (1 of 2)]
[im 1/1]
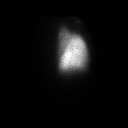

[Series 5: lt lat/rt lat perf · 4.14mm/px · 1 of 1 slices shown (2 of 2)]
[im 1/1]
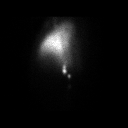

[Series 6: lpo/rao perf · 4.14mm/px · 1 of 1 slices shown (1 of 2)]
[im 1/1]
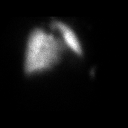

[Series 6: lpo/rao perf · 4.14mm/px · 1 of 1 slices shown (2 of 2)]
[im 1/1]
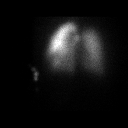

[Series 7: ant/post perf · 4.14mm/px · 1 of 1 slices shown (1 of 2)]
[im 1/1]
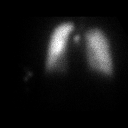

[Series 7: ant/post perf · 4.14mm/px · 1 of 1 slices shown (2 of 2)]
[im 1/1]
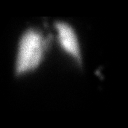

[Series 8: lao/rpo perf · 4.14mm/px · 1 of 1 slices shown (1 of 2)]
[im 1/1]
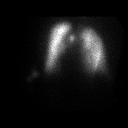

[Series 8: lao/rpo perf · 4.14mm/px · 1 of 1 slices shown (2 of 2)]
[im 1/1]
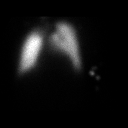

[16 of 16 positions shown; findings below may reference images not displayed]

FINDINGS: Bilateral mild ventilatory defects without corresponding perfusion
defects are noted. Given chest x-ray findings of CHF these findings
are most likely secondary to CHF. No evidence of pulmonary embolus.
IMPRESSION: No evidence of pulmonary embolus.

## 2019-11-04 DEATH — deceased

## 2019-12-02 ENCOUNTER — Ambulatory Visit: Payer: PPO | Admitting: Cardiology

## 2020-12-14 IMAGING — CT CT BIOPSY
1 of 2 series · 15 of 28 positions shown, 19 images · non-contrast
Comparison: none

INDICATION: Concern for multiple myeloma relapse.

[Series 2: i-spiral 5.0 b40f · axial · 0.98mm/px · z∈[-110,-33]mm · 15 of 25 slices shown, 19 images]
[im 2/25  mediastinal]
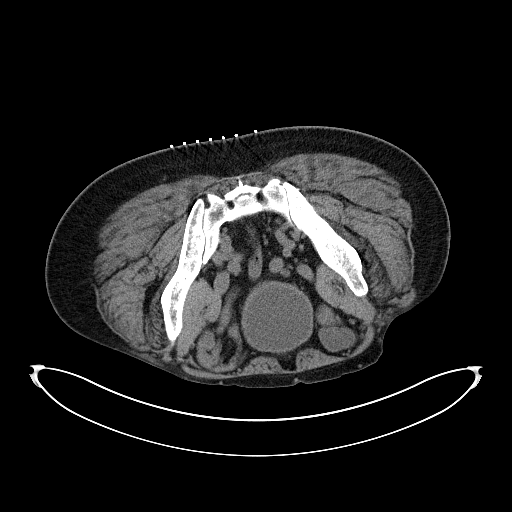
[im 2/25  lung]
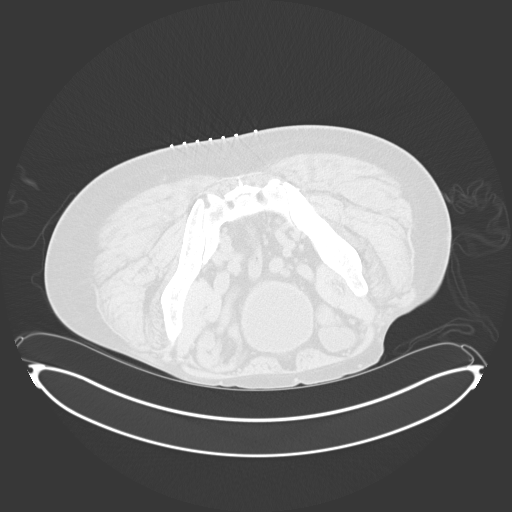
[im 4/25  lung]
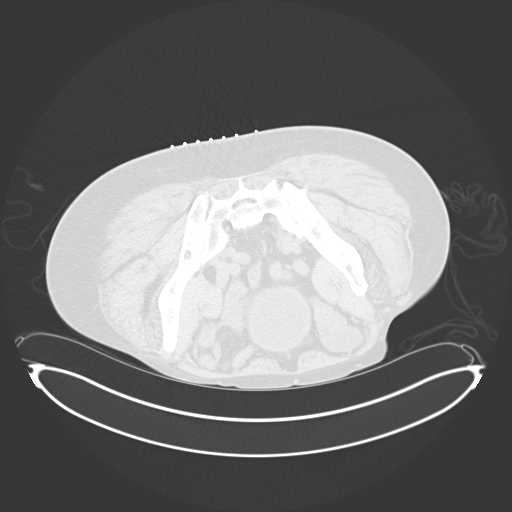
[im 5/25  lung]
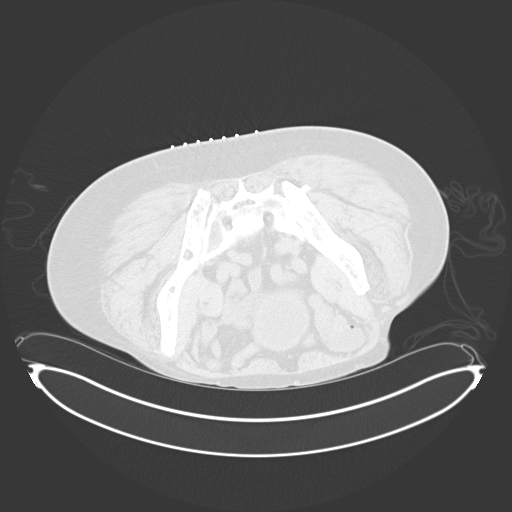
[im 7/25  lung]
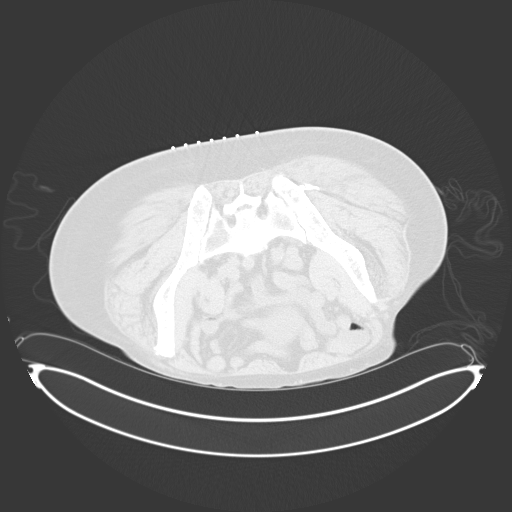
[im 8/25  mediastinal]
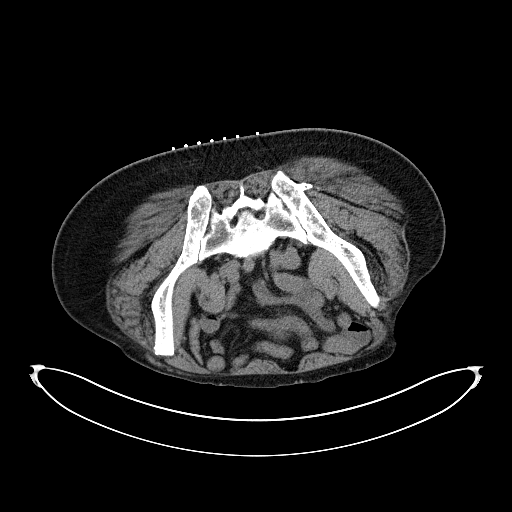
[im 8/25  lung]
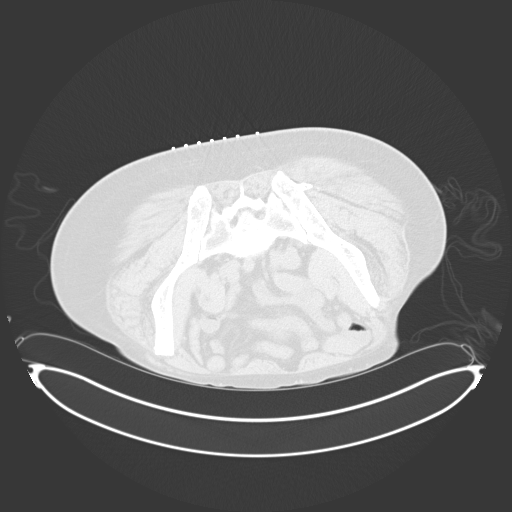
[im 10/25  lung]
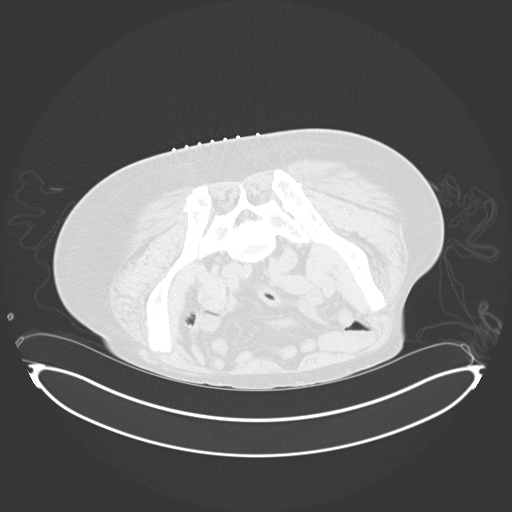
[im 11/25  lung]
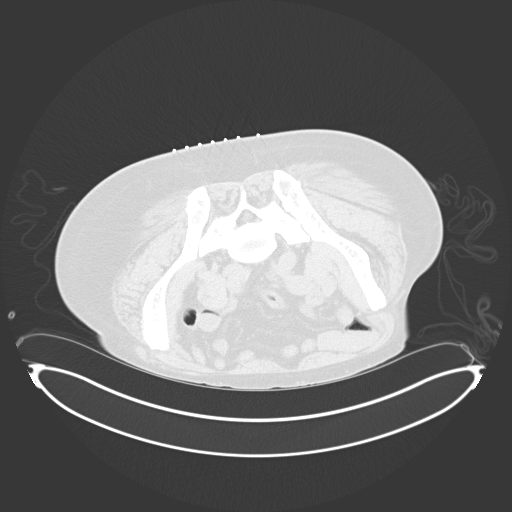
[im 13/25  lung]
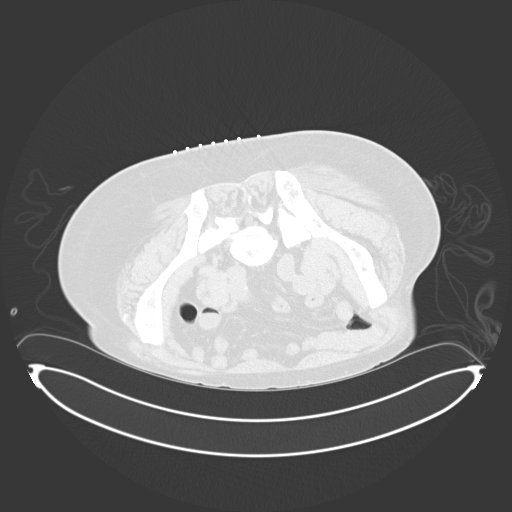
[im 15/25  mediastinal]
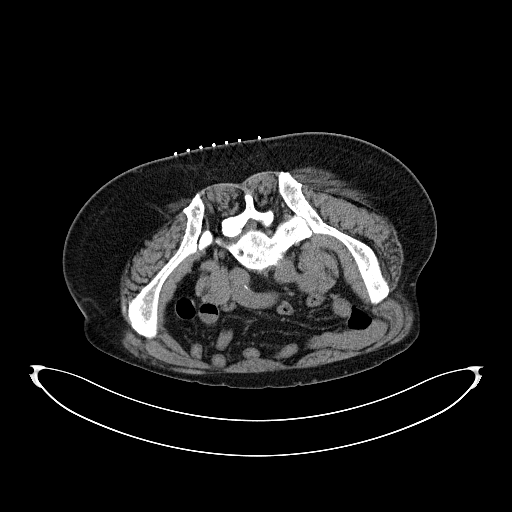
[im 15/25  lung]
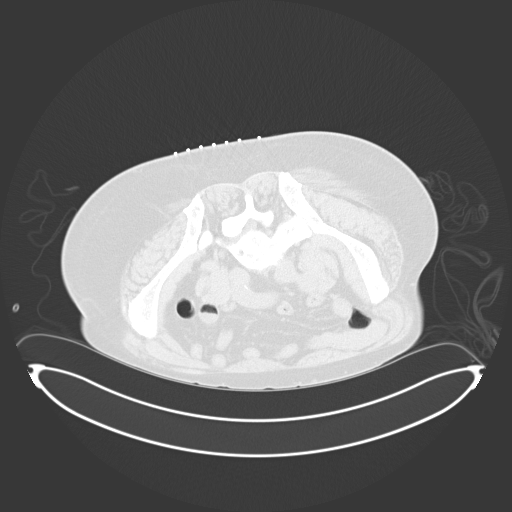
[im 16/25  lung]
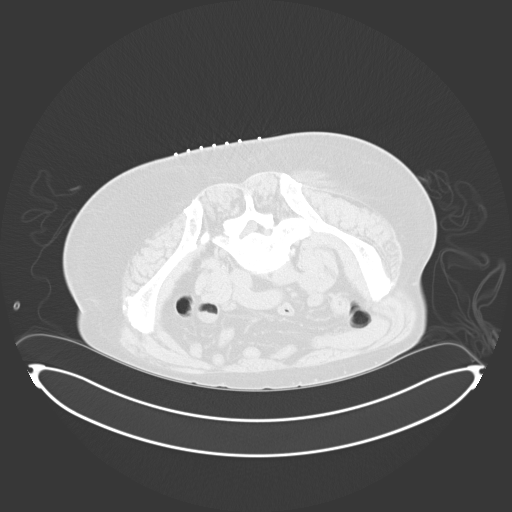
[im 18/25  lung]
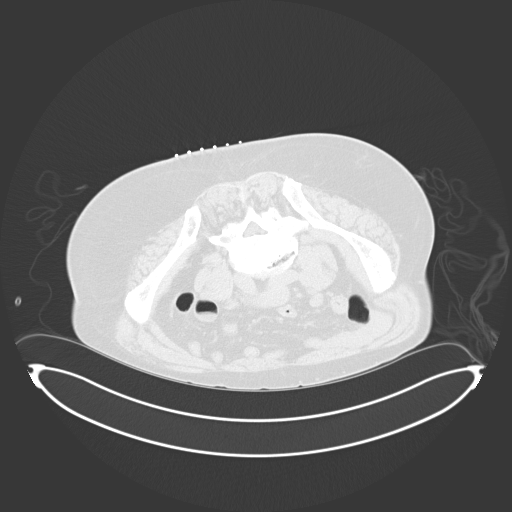
[im 19/25  lung]
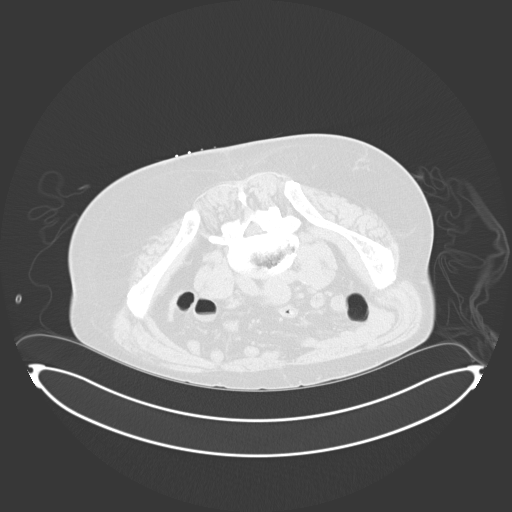
[im 21/25  mediastinal]
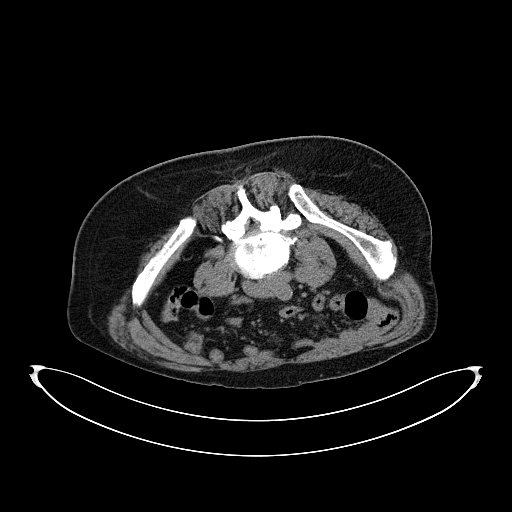
[im 21/25  lung]
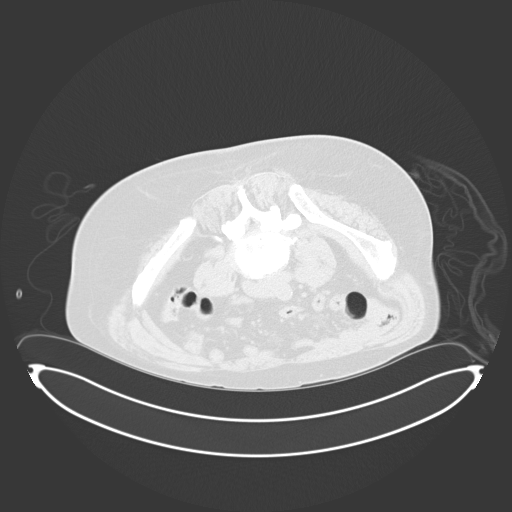
[im 22/25  lung]
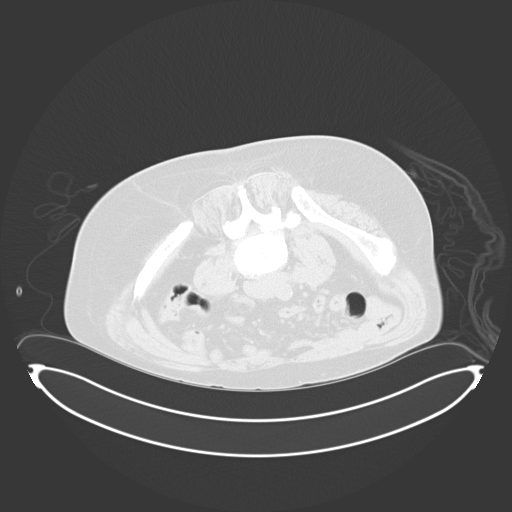
[im 24/25  lung]
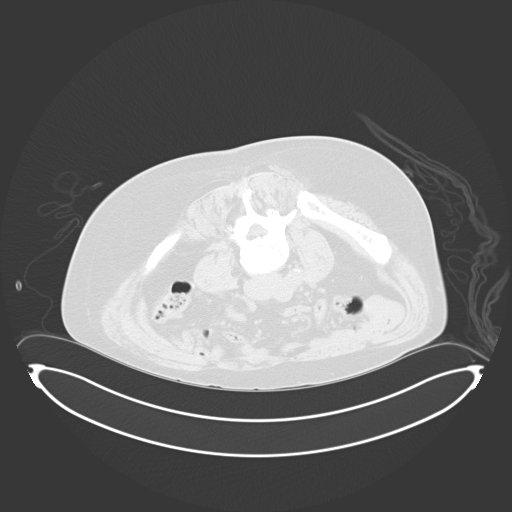

[15 of 28 positions shown; findings below may reference images not displayed]

EXAM:
CT GUIDED BONE MARROW ASPIRATES AND BIOPSY

MEDICATIONS:
None.

ANESTHESIA/SEDATION:
Fentanyl 50 mcg IV; Versed 1.5 mg IV

Moderate Sedation Time:  12 minutes

The patient was continuously monitored during the procedure by the
interventional radiology nurse under my direct supervision.

COMPLICATIONS:
None immediate.

PROCEDURE:
The procedure was explained to the patient. The risks and benefits
of the procedure were discussed and the patient's questions were
addressed. Informed consent was obtained from the patient. The
patient was placed prone on CT table. Images of the pelvis were
obtained. The right side of back was prepped and draped in sterile
fashion. The skin and right posterior ilium were anesthetized with
1% lidocaine. 11 gauge bone needle was directed into the right ilium
with CT guidance. Two aspirates and one core biopsy were obtained.
Bandage placed over the puncture site.
IMPRESSION: CT guided bone marrow aspiration and core biopsy.
